# Patient Record
Sex: Female | Born: 1935 | Race: White | Hispanic: No | Smoking: Never smoker
Health system: Southern US, Community
[De-identification: ages and names within clinical notes are randomized; demographics above are authoritative.]

## PROBLEM LIST (undated history)

## (undated) DIAGNOSIS — N200 Calculus of kidney: Secondary | ICD-10-CM

## (undated) DIAGNOSIS — J189 Pneumonia, unspecified organism: Secondary | ICD-10-CM

## (undated) DIAGNOSIS — I10 Essential (primary) hypertension: Secondary | ICD-10-CM

## (undated) DIAGNOSIS — F419 Anxiety disorder, unspecified: Secondary | ICD-10-CM

## (undated) DIAGNOSIS — D649 Anemia, unspecified: Secondary | ICD-10-CM

## (undated) DIAGNOSIS — N390 Urinary tract infection, site not specified: Secondary | ICD-10-CM

## (undated) DIAGNOSIS — M1990 Unspecified osteoarthritis, unspecified site: Secondary | ICD-10-CM

## (undated) DIAGNOSIS — F0390 Unspecified dementia without behavioral disturbance: Secondary | ICD-10-CM

## (undated) DIAGNOSIS — G309 Alzheimer's disease, unspecified: Secondary | ICD-10-CM

## (undated) DIAGNOSIS — F0280 Dementia in other diseases classified elsewhere without behavioral disturbance: Secondary | ICD-10-CM

## (undated) DIAGNOSIS — E785 Hyperlipidemia, unspecified: Secondary | ICD-10-CM

## (undated) HISTORY — DX: Urinary tract infection, site not specified: N39.0

## (undated) HISTORY — DX: Unspecified dementia without behavioral disturbance: F03.90

## (undated) HISTORY — PX: CHOLECYSTECTOMY: SHX55

---

## 2001-12-14 ENCOUNTER — Ambulatory Visit (HOSPITAL_COMMUNITY): Admission: RE | Admit: 2001-12-14 | Discharge: 2001-12-14 | Payer: Self-pay | Admitting: Family Medicine

## 2001-12-14 ENCOUNTER — Encounter: Payer: Self-pay | Admitting: Family Medicine

## 2001-12-23 ENCOUNTER — Ambulatory Visit (HOSPITAL_COMMUNITY): Admission: RE | Admit: 2001-12-23 | Discharge: 2001-12-23 | Payer: Self-pay | Admitting: Family Medicine

## 2001-12-23 ENCOUNTER — Encounter: Payer: Self-pay | Admitting: Family Medicine

## 2005-06-26 ENCOUNTER — Ambulatory Visit (HOSPITAL_COMMUNITY): Admission: RE | Admit: 2005-06-26 | Discharge: 2005-06-26 | Payer: Self-pay | Admitting: Family Medicine

## 2005-08-15 ENCOUNTER — Inpatient Hospital Stay (HOSPITAL_COMMUNITY): Admission: EM | Admit: 2005-08-15 | Discharge: 2005-08-19 | Payer: Self-pay | Admitting: Emergency Medicine

## 2005-08-18 ENCOUNTER — Ambulatory Visit: Payer: Self-pay | Admitting: *Deleted

## 2006-08-10 ENCOUNTER — Ambulatory Visit (HOSPITAL_COMMUNITY): Admission: RE | Admit: 2006-08-10 | Discharge: 2006-08-10 | Payer: Self-pay | Admitting: Family Medicine

## 2007-08-31 ENCOUNTER — Ambulatory Visit (HOSPITAL_COMMUNITY): Admission: RE | Admit: 2007-08-31 | Discharge: 2007-08-31 | Payer: Self-pay | Admitting: Family Medicine

## 2008-09-20 ENCOUNTER — Ambulatory Visit (HOSPITAL_COMMUNITY): Admission: RE | Admit: 2008-09-20 | Discharge: 2008-09-20 | Payer: Self-pay | Admitting: Family Medicine

## 2009-11-28 ENCOUNTER — Ambulatory Visit (HOSPITAL_COMMUNITY): Admission: RE | Admit: 2009-11-28 | Discharge: 2009-11-28 | Payer: Self-pay | Admitting: Family Medicine

## 2011-01-17 NOTE — Group Therapy Note (Signed)
NAMELYNDEN, BORAK                 ACCOUNT NO.:  [id number]   MEDICAL RECORD NO.:  [id number]          PATIENT TYPE:  INP   LOCATION:  A206                          FACILITY:  [facility]   PHYSICIAN:  Angus G. Everette Rank, MD   DATE OF BIRTH:  12/08/1923   DATE OF PROCEDURE:  08/19/2005  DATE OF DISCHARGE:  08/19/2005                                   PROGRESS NOTE   SUBJECTIVE:  This patient was admitted following syncopal episode.  Had new  onset, right bundle branch block noted by ED physician. CT of the had shows  evidence of cortical atrophy, small lacunar infarct felt to be old.  Her  condition remained stable.  She had been seen by Cardiology.  She was to  have an echocardiogram and carotid Doppler ultrasound.   OBJECTIVE:  VITAL SIGNS:  Blood pressure 143/74, respirations 24, pulse 73,  temperature 97.5.  HEART:  Irregular rhythm.  LUNGS:  Clear to P&A.  ABDOMEN:  No palpable organs or masses.   ASSESSMENT:  The patient was admitted following syncopal episode.  She does  have right bundle branch block.   PLAN:  The patient was admitted following episode of syncope, possibly  vasovagal.  The patient currently is scheduled for echocardiogram and  carotid Doppler ultrasound.  Continue current regimen.      Angus G. Everette Rank, MD  Electronically Signed     AGM/MEDQ  D:  08/19/2005  T:  08/20/2005  Job:  [id number]

## 2011-01-17 NOTE — Group Therapy Note (Signed)
[name], [name]                 ACCOUNT NO.:  [id number]   MEDICAL RECORD NO.:  [id number]          PATIENT TYPE:  INP   LOCATION:  A206                          FACILITY:  [facility]   PHYSICIAN:  Angus G. Everette Rank, MD   DATE OF BIRTH:  07/10/1924   DATE OF PROCEDURE:  08/16/2005  DATE OF DISCHARGE:                                   PROGRESS NOTE   SUBJECTIVE:  The patient had a syncopal episode prior to admission.  She did  have new-onset right bundle branch block which was noted by ER physician.  CT scan does show atrophy and small lacunar infarct thought to be old.  The  patient has stated that she does not want CPR, electric shock, etc. should  she have a code.  Her condition has remained stable.   OBJECTIVE:  VITAL SIGNS:  Blood pressure 142/76, respirations 20, pulse 83,  temperature 98.1.  HEART:  Regular rhythm.  LUNGS:  Clear to P&A.  ABDOMEN:  No palpable organs or masses.   ASSESSMENT:  The patient was admitted with syncope, right bundle branch  block.  Cardiac enzymes remained normal with exception of slight elevation  of myoglobin at 291.   PLAN:  Plan is to continue current regimen.  Will obtain cardiology consult.      Angus G. Everette Rank, MD  Electronically Signed     AGM/MEDQ  D:  08/16/2005  T:  08/16/2005  Job:  [id number]

## 2011-01-17 NOTE — Discharge Summary (Signed)
[name], [name]                 ACCOUNT NO.:  [id number]   MEDICAL RECORD NO.:  [id number]          PATIENT TYPE:  INP   LOCATION:  A206                          FACILITY:  [facility]   PHYSICIAN:  Angus G. Everette Rank, MD   DATE OF BIRTH:  1923-11-18   DATE OF ADMISSION:  08/15/2005  DATE OF DISCHARGE:  12/19/2006LH                                 DISCHARGE SUMMARY   A 77 year old black female admitted August 15, 2005, discharged August 19, 2005, four days' hospitalization.   DIAGNOSES:  1.  Syncope.  2.  Mild hypokalemia.  3.  Dementia.   This 77 year old patient was admitted following an episode of syncope. The  patient has had episodes of syncope in the past. The patient does have a  history of dyslipidemia, dementia.   PHYSICAL EXAMINATION:  GENERAL:  Alert female with blood pressure 160/89,  pulse 82, respirations 18.  HEENT:  Eyes:  PERRLA. TMs negative. Oropharynx benign.  NECK:  Supple. No JVD or thyroid abnormalities.  LUNGS:  Clear to P&A.  HEART:  Regular rhythm. A 1/6 systolic murmur left sternal border.  ABDOMEN:  No palpable organs or masses.  EXTREMITIES:  Free of edema.  NEUROLOGICAL:  No focal deficits. The patient has some impaired memory.   LABORATORY DATA:  Admission CBC:  WBC 8,200, hemoglobin 13.3, hematocrit  38.7. Chemistries:  Sodium 137, potassium 3.3, chloride 102, CO2 26, glucose  130, BUN 12, creatinine 1.5, calcium 8.8. Cardiac enzymes:  CPK 52, CPK-MB  1.3, troponin 0.02, CK on admission 58, CK-MB 1.2, troponin 0.01. Subsequent  cardiac markers on August 19, 2005:  CK 49, CK-MB 1, troponin 0.02.  Urinalysis negative. X-rays:  Head CT:  No acute intracranial abnormalities.  Chest x-ray:  Compression fracture at T7, questionable. CT of the head:  Atrophy and small lacunar infarct, probably old.   HOSPITAL COURSE:  The patient was admitted, placed on intravenous fluids,  KVO normal saline. Was continued on home medications, aspirin 325 daily.  Cardiac panel was negative essentially. The patient had no further episode  of syncope during her hospital stay. Two-D echocardiogram was ordered and  carotid Doppler ultrasound was ordered. The patient was seen by cardiology.  Carotid Doppler ultrasound did not show significant stenosis. EKG:  Sinus rhythm, rate of 79, right bundle branch block noted. The patient  showed progressive improvement and was able to be discharged after four days  hospitalization. The patient was discharged on metoprolol 25 mg twice a day  and aspirin 325 mg daily. The patient was in stable condition at the time of  her discharge.      Angus G. Everette Rank, MD  Electronically Signed     AGM/MEDQ  D:  09/05/2005  T:  09/05/2005  Job:  [id number]

## 2011-01-17 NOTE — H&P (Signed)
NAME:  [name], [name] NO.:  [id number]   MEDICAL RECORD NO.:  [id number]          PATIENT TYPE:  INP   LOCATION:  A206                          FACILITY:  [facility]   PHYSICIAN:  Delphina Cahill, M.D.        DATE OF BIRTH:  30-Mar-1924   DATE OF ADMISSION:  08/15/2005  DATE OF DISCHARGE:  LH                                HISTORY & PHYSICAL   HISTORY OF PRESENT ILLNESS:  [name] is a pleasant 77 year old white  female with minimal past medical history who apparently today was out  grocery shopping with her son.  When leaving the store, she passed out in  the parking lot without any knowledge or inciting event.  She was out for  approximately 1-2 minutes and then woke up and had an episode of vomiting  x1, was assisted to chair and then again passed out for a very short period  at that time.  When patient again came to, she did not recall any of the  events.  She did have an event similar to this approximately 1 month ago but  did not have to go to the hospital at that time, had further workup by Dr.  Everette Rank at that time which did not reveal any specific cause.  She was  brought via EMS to the emergency department for further workup of this  syncopal episode.  She denied any chest pain, just some generalized weakness  feeling, denied any shortness of breath, no abdominal problems, no blood in  stools, no problems with bowel or bladder and no problems with lower  extremity edema, no headaches, no blurry vision, no double vision.   PAST MEDICAL HISTORY:  Does have some dyslipidemia, just started on Zetia  approximately 1 month ago, has had a cholecystectomy approximately 20 plus  years ago and has some dementia.   MEDICATIONS:  She is on Zetia 10 mg p.o. daily.  No other specific  medicines.   SOCIAL HISTORY:  Her son lives with her in [place].  She is widowed, does not  smoke or drink, has another son who lives next door to her and daughter who  lives within 10 miles.   Patient does all of her ADL's as far as dressing and  cleaning herself, cooking and cleaning, and previous to this event was also  driving short distances.  Does not require any specific help with her  walking but does hold onto somebody for stability.   REVIEW OF SYSTEMS:  Review of systems as mentioned above are all negative.  Does have some anxiety per daughter's report as well as memory difficulties.   OBJECTIVE:  VITAL SIGNS:  Temperature is 96.7, blood pressure is 160/89,  pulse is 82, respirations are 18, she is 99% on room air.  GENERAL:  This is a mildly obese white female in no acute distress lying in  bed.  HEENT:  Pupils equal, round and react to light and accommodation.  Oropharynx is clear.  Extraocular movements intact.  Neck is supple; no JVD,  no thyromegaly.  LUNGS:  Clear to auscultation bilaterally.  No rhonchi or wheezing.  CARDIOVASCULAR:  Regular rhythm.  Question faint 1/6 systolic murmur best  appreciated at the left upper sternal border.  ABDOMEN:  Soft, nontender, nondistended, with positive bowel sounds.  EXTREMITIES:  Trace lower extremity edema bilaterally with 2+ pulses in all  extremities.  MUSCULOSKELETAL:  5/5 strength in all extremities.  NEUROLOGIC:  Cranial nerves II-XII intact.  No decrease in sensation.  Reflexes were symmetric bilaterally.  Downgoing toes bilaterally.  MEMORY:  Patient is alert to person and place, knows what month but had some  difficulty with this as well as date, does have some definite memory  difficulties, no thorough Mini Mental Status exam was done but what likely  put her in the 15-20 on the Mini Mental Status.  Also, she does have some  anxiety and does have repeated questions but speech is relatively normal.   LABORATORY WORK:  Lab work reveals BMET of sodium 137, potassium 3.3,  chloride 102, CO2 26, glucose 130, BUN 12, creatinine 1.5, calcium 8.8.  CBC  showed a white count of 8.2, hemoglobin of 13.3, platelet count  of 210.  Initial cardiac marker shows CK-MB of less than 1, troponin I of less than  0.05, myoglobin of 292.  UA showed small amount of blood but negative for  everything else.  Second set of cardiac markers show CK-MB of 1, troponin I  of less than 0.05, myoglobin of 291.   CT scan of head reveals atrophy with a small lacunar infarct and posterior  right basal ganglia probably old, no acute intracranial abnormalities at  this time.   EKG attained shows normal sinus rhythm with rate of 79 but significant  rightward axis, right bundle branch block and question left atrial  enlargement, does have ST depression in III and flattening in aVF, also has  ST depression in V1-V3, no significant Q waves and intervals appear normal.   IMPRESSION:  This is a 77 year old white female with a syncopal episode of  unknown origin.   ASSESSMENT AND PLAN:  1.  Syncope.  Patient not having any significant chest pain but given the      EKG she does have a risk factor of dyslipidemia that could be indicative      of some heart abnormality that caused this, an arrhythmia also could      have caused this, we will keep on telemetry and monitor for this.  She      does have a slight murmur and we will get a 2-D echo to assess this      further.  We will cycle enzymes and get another EKG once to floor.  We      will check her thyroid out as well.  Also with her CT scan she does have      atrophy and small lacunar infarct from previous felt to be old but we      will also get a carotid Doppler.  We may want to get an MRI for full      workup with this but we will start her on aspirin 325 mg once daily and      we will get a cardiology consult for management.  2.  Dementia.  She has pretty moderate dementia at this time but is still      very functional.  She may benefit from going on Aricept once felt stable     from a medical standpoint; started on  Aricept to help slow the      progression of her dementia; I  question whether some vascular dementia      plus or minus Alzheimer's but both her mother and sister were diagnosed      with Alzheimer's.  3.  Mild hypokalemia.  Most likely secondary to vomiting episode.  We will      replete x1 now.   DISPOSITION:  Patient will be admitted for further workup.  In discussion  with the patient, she feels that she does not want any extreme measures  taken if something was to happen with her heart.  Essentially she is a  DNR/DNI and does not want to be a burden on anybody else so does not want  CPR or electric shock so DNR form will be filled out on her chart.      Delphina Cahill, M.D.  Electronically Signed     ZH/MEDQ  D:  08/15/2005  T:  08/15/2005  Job:  [id number]

## 2011-01-17 NOTE — Consult Note (Signed)
NAMEJILLIYN, [name] NO.:  [id number]   MEDICAL RECORD NO.:  [id number]          PATIENT TYPE:  INP   LOCATION:  A206                          FACILITY:  [facility]   PHYSICIAN:  Scarlett Presto, M.D.   DATE OF BIRTH:  Sep 03, 1923   DATE OF CONSULTATION:  08/18/2005  DATE OF DISCHARGE:                                   CONSULTATION   REFERRING PHYSICIAN:  Angus G. Everette Rank, M.D.   HISTORY OF PRESENT ILLNESS:  [name] is a 77 year old woman who was  admitted on August 15, 2005, with an episode of syncope.  She states that  she has been somebody who has passed out and fainted since the age of 69.  It is usually associated with standing.  She gets a dizzy feeling and then  usually blacks out.  Most of the time, she can ease herself to the floor  before she loses consciousness and she is frequently able to abort the  episodes by just sitting or reclining.  She says that when she got to be  middle-age, she had far less of these symptoms and really only was  associated with kidney stones where she was acutely ill when she would have  these episodes of loss of consciousness.  Recently she has had two episodes.  The first was in October 2006, when she was sitting on the commode she had a  bowel movement and then lost consciousness.  Her daughter was there and  heard her fall and came into the room and helped her clean herself up.  She  called 911 and by the time they arrived she was completely asymptomatic and  she ended up having an outpatient evaluation by her primary doctor within  nothing found.  She did well until the day of admission when she had been  shopping, first at [facility] for about 45 minutes and then she was in the  line at the grocery store after having stood for about 45 minutes where she  had an episode and felt very dizzy.  She was unable to sit down and lost  consciousness.  She did not injure herself on either occasion, no tongue  biting, no seizure  activity.  She awakened both times without any postictal,  no loss of bowel or bladder continence and she had no injuries.  She denies  any chest pain, palpitations or shortness of breath before and she always  gets this prodrome and it almost always occurs when she is standing.   PAST MEDICAL HISTORY:  1.  Dyslipidemia.  2.  Dementia.  I actually did a Mini-Mental Status Exam on her and she      scored 18/30.  She does not really know where she is.  She thought she      was in the doctor's office, but she is easily redirected and appears to      understand.  She did not know the name of the president.  She did know      it was 2006, but she did not know the day or date.  She knew it was      close to Christmas, however.  She remembers her children's names, how      old they are, when and where they were born, but has difficulty      recalling recent events with very poor short term memory.  3.  History of kidney stones.  4.  Cholecystectomy.  5.  Mild dyslipidemia.   ALLERGIES:  SULFA.   CURRENT MEDICATIONS:  Zetia 10 mg a day as outpatient.   SOCIAL HISTORY:  She lives in [place] with her son who has a seizure  disorder.  She is a widow.  She does not smoke, drink or use illicit drugs  and never has.  She is independent and actually drives, even though she has  had multiple episodes of syncope.   FAMILY HISTORY:  Her mother died of complications of Alzheimer's disease.  Her father died of a myocardial infarction in his 76s.  She has one son who  died of a myocardial infarction in his 35s and another son who died of an  aneurysm at age 74.  She has a daughter who is healthy and two sons who are  reasonably healthy with the exception of the one who has the epilepsy.   REVIEW OF SYSTEMS:  CONSTITUTIONAL:  She denies any fever, chills, sweats,  weight loss, adenopathy, no headaches, no sinus tenderness or sinus  discharge.  No changes in her voice.  No photophobia or vision problems.   No  problems with her skin.  CARDIOPULMONARY:  She denies any chest pain,  shortness of breath, dyspnea on exertion, PND or orthopnea.  No  claudication.  GENITOURINARY:  No urinary frequency or incontinence.  NEUROLOGIC:  No weakness or numbness.  She says she does have some  arthralgias in her left knee and ankle.  GASTROINTESTINAL:  No nausea,  vomiting, diarrhea, bright red blood per rectum, melena, hematochezia,  odynophagia, dysphagia, GERD symptoms.  The remainder of the review of  systems is negative.   PHYSICAL EXAMINATION:  GENERAL:  She is a very pleasant, well-developed,  well-nourished, elderly, white female.  She is alert and oriented x2 as  previously described.  VITAL SIGNS:  Her pulse is 63, respirations 16, blood pressure 159/76 when  she is lying down.  When we sit her up, her heart rate goes up to 87, her  blood pressure goes to 157/82 and when we stand her up her blood pressure  goes to 140/84 with a heart rate of 96.  She does not get symptomatic.  HEENT:  Within normal limits.  Normocephalic, atraumatic.  NECK:  Supple.  She has no JVD.  No carotid bruits.  Carotid upstrokes are  normal.  CARDIAC:  Regular.  There is no murmur.  S1, S2 normal.  It sounds like she  has an S4, but point of maximal impulse is not displaced.  LUNGS:  Clear to auscultation bilaterally.  ABDOMEN:  Normal.  SKIN:  Normal.  GENITALIA:  Deferred.  RECTAL:  Deferred.  BREASTS:  Deferred.  EXTREMITIES:  Trace edema with 1+ pulses.  MUSCULOSKELETAL:  Grossly nonfocal.  NEUROLOGIC:  Grossly nonfocal.   LABORATORY DATA AND X-RAY FINDINGS:  Head CT shows atrophy with small  lacunar infarct on the posterior right basal ganglia.  No acute  abnormalities were seen.  She has a compression fracture at T7 which appears  to be old.  Her EKG shows sinus rhythm with a rate of 73.  She has normal  PR interval.  She has a right bundle branch block with a QRS of 133 msec.  Her  QRS axis is 103, so  mildly rightward.  She has no Q waves concerning for an  old myocardial infarction and her ST and T waves do not appear to be  ischemic.  It is unclear to me how old this right bundle branch block is  since we have no old EKGs for comparison.   White blood cell count 7.0, H&H 11 and 33, platelet count 191.  Sodium 140,  potassium 3.7, chloride 106, bicarb 27, BUN 16, creatinine 1.4 and blood  sugars 90.  TSH is normal.  Urinalysis shows some white blood cells, but  otherwise is normal.   ASSESSMENT:  This is a lady who had an episode of syncope which sounds very  likely to be vasovagal and sounds like it might be a lifelong problem for  her.   RECOMMENDATIONS:  I have recommended to her daughter and the patient that  she stop driving and really should probably surrender her driver's license.  We will get an echocardiogram and a set of carotids and follow her  on telemetry.  I think probably, we should consider using a beta-blocker for  her hypertension and this may also help somewhat with her vagal syncope.  We  will follow her along with the results of workup and make determination as  to whether or not she needs any further therapy.      Scarlett Presto, M.D.  Electronically Signed     JH/MEDQ  D:  08/18/2005  T:  08/18/2005  Job:  [id number]

## 2011-01-17 NOTE — Procedures (Signed)
NAMEJAYDIE, [name] NO.:  [id number]   MEDICAL RECORD NO.:  [id number]          PATIENT TYPE:  INP   LOCATION:  A206                          FACILITY:  [facility]   PHYSICIAN:  Jacqulyn Ducking, M.D. [facility] OF BIRTH:  1924/08/13   DATE OF PROCEDURE:  08/18/2005  DATE OF DISCHARGE:                                  ECHOCARDIOGRAM   REFERRING PHYSICIAN:  Angus G. Everette Rank, M.D./Dr. Nevada Crane   CLINICAL DATA:  A 77 year old woman with syncope.   M-MODE TRACINGS:  Aorta 3.2, left atrium 4.0, septum 1.1, posterior wall  1.2, LV diastole 4.1, LV systole 2.9.   FINDINGS:  1.  Technically adequate echocardiographic study.  2.  Left atrial size at the upper limit of normal; normal right atrial size.  3.  Normal right ventricular size and function; borderline RVH.  4.  Trileaflet aortic valve with minimal sclerosis and very mild      insufficiency; mild annular calcification.  5.  Normal mitral valve; mild annular calcification.  6.  Tricuspid and pulmonic valves not well seen, probably normal.  7.  Normal left ventricular size; borderline hypertrophy; hyperdynamic      regional and global function.  8.  Normal IVC.      Jacqulyn Ducking, M.D. [facility]  Electronically Signed     RR/MEDQ  D:  08/18/2005  T:  08/19/2005  Job:  [id number]

## 2011-01-17 NOTE — Group Therapy Note (Signed)
[name], FORTUNO                 ACCOUNT NO.:  [id number]   MEDICAL RECORD NO.:  [id number]          PATIENT TYPE:  INP   LOCATION:  A206                          FACILITY:  [facility]   PHYSICIAN:  Angus G. Everette Rank, MD   DATE OF BIRTH:  1924/03/01   DATE OF PROCEDURE:  08/18/2005  DATE OF DISCHARGE:                                   PROGRESS NOTE   SUBJECTIVE:  This patient was admitted with syncopal episode, has new onset  right bundle branch block as noted by ER physician, CT scan of the head  shows cortical atrophy, small lacunar infarct thought to be old.   OBJECTIVE:  GENERAL:  Patient's condition remains stable, at times mentally  confused.  LUNGS:  Diminished breath sounds.  HEART:  Regular rhythm.  ABDOMEN:  No palpable organs or masses.   ASSESSMENT:  1.  Syncope.  2.  Right bundle branch block.   PLAN:  Plan to obtain cardiology consult today.      Angus G. Everette Rank, MD  Electronically Signed     AGM/MEDQ  D:  08/18/2005  T:  08/18/2005  Job:  [id number]

## 2011-01-17 NOTE — Group Therapy Note (Signed)
[name], [name]                 ACCOUNT NO.:  [id number]   MEDICAL RECORD NO.:  [id number]          PATIENT TYPE:  INP   LOCATION:  A206                          FACILITY:  [facility]   PHYSICIAN:  Angus G. Everette Rank, MD   DATE OF BIRTH:  08-Dec-1923   DATE OF PROCEDURE:  08/17/2005  DATE OF DISCHARGE:                                   PROGRESS NOTE   SUBJECTIVE:  This patient had a syncopal prior to admission.  She has not  had any further problems.  She does have a new-onset right bundle branch  block noted by ER physician.  CT scan shows cortical atrophy thought to be  old.   OBJECTIVE:  VITAL SIGNS:  Blood pressure 172/76, respirations 19, pulse 96,  temperature 97.8.  HEART:  Regular rate and rhythm.  LUNGS:  Diminished breath sounds.  ABDOMEN:  No palpable organs or masses.   The patient's cardiac markers remained normal with slight elevation in  myoglobin at 291.   ASSESSMENT:  The patient was admitted following episode of syncope, new-  onset and right bundle branch block.   PLAN:  Continue monitoring.  Will be seen and evaluated by cardiology.      Angus G. Everette Rank, MD  Electronically Signed     AGM/MEDQ  D:  08/17/2005  T:  08/18/2005  Job:  [id number]

## 2011-06-13 ENCOUNTER — Other Ambulatory Visit: Payer: Self-pay

## 2011-06-13 ENCOUNTER — Encounter: Payer: Self-pay | Admitting: *Deleted

## 2011-06-13 ENCOUNTER — Emergency Department (HOSPITAL_COMMUNITY)
Admission: EM | Admit: 2011-06-13 | Discharge: 2011-06-13 | Disposition: A | Discharge status: Home / Self Care | Payer: Medicare Other | Attending: Emergency Medicine | Admitting: Emergency Medicine

## 2011-06-13 ENCOUNTER — Emergency Department (HOSPITAL_COMMUNITY): Payer: Medicare Other

## 2011-06-13 DIAGNOSIS — R079 Chest pain, unspecified: Secondary | ICD-10-CM | POA: Insufficient documentation

## 2011-06-13 DIAGNOSIS — N289 Disorder of kidney and ureter, unspecified: Secondary | ICD-10-CM | POA: Insufficient documentation

## 2011-06-13 DIAGNOSIS — I4510 Unspecified right bundle-branch block: Secondary | ICD-10-CM | POA: Insufficient documentation

## 2011-06-13 DIAGNOSIS — F0390 Unspecified dementia without behavioral disturbance: Secondary | ICD-10-CM | POA: Insufficient documentation

## 2011-06-13 HISTORY — DX: Essential (primary) hypertension: I10

## 2011-06-13 HISTORY — DX: Unspecified osteoarthritis, unspecified site: M19.90

## 2011-06-13 LAB — URINE MICROSCOPIC-ADD ON

## 2011-06-13 LAB — TROPONIN I: Troponin I: <0.3 ng/mL (ref <0.30)

## 2011-06-13 LAB — DIFFERENTIAL
Basophils Absolute: 0 10*3/uL (ref 0.0–0.1)
Basophils Relative: 0 % (ref 0–1)
Lymphocytes Relative: 23 % (ref 12–46)
Neutro Abs: 7.3 10*3/uL (ref 1.7–7.7)
Neutrophils Relative %: 70 % (ref 43–77)

## 2011-06-13 LAB — COMPREHENSIVE METABOLIC PANEL
ALT: 9 U/L (ref 0–35)
AST: 12 U/L (ref 0–37)
Albumin: 3.7 g/dL (ref 3.5–5.2)
CO2: 23 mEq/L (ref 19–32)
Calcium: 8.8 mg/dL (ref 8.4–10.5)
Chloride: 108 mEq/L (ref 96–112)
Creatinine, Ser: 2.25 mg/dL — ABNORMAL HIGH (ref 0.50–1.10)
GFR calc non Af Amer: 19 mL/min — ABNORMAL LOW (ref >90)
Sodium: 141 mEq/L (ref 135–145)
Total Bilirubin: 0.2 mg/dL — ABNORMAL LOW (ref 0.3–1.2)

## 2011-06-13 LAB — URINALYSIS, ROUTINE W REFLEX MICROSCOPIC
Bilirubin Urine: NEGATIVE
Glucose, UA: NEGATIVE mg/dL
Specific Gravity, Urine: 1.01 (ref 1.005–1.030)
Urobilinogen, UA: 0.2 mg/dL (ref 0.0–1.0)

## 2011-06-13 LAB — CBC
MCHC: 32.1 g/dL (ref 30.0–36.0)
Platelets: 197 10*3/uL (ref 150–400)
RDW: 13.8 % (ref 11.5–15.5)
WBC: 10.5 10*3/uL (ref 4.0–10.5)

## 2011-06-13 NOTE — ED Provider Notes (Signed)
History     CSN: [id number] Arrival date & time: 06/13/2011 12:38 AM  Chief Complaint  Patient presents with  . Chest Pain    (Consider location/radiation/quality/duration/timing/severity/associated sxs/prior treatment) HPI Comments: Seen 0126. Patient is an elderly woman with significant dementia who lives with a son and daughter-in-law. She is not able to contibute to her history. Marland Kitchen She woke at 10 PM per the son and c/o chest pain. EMS was called and upon their arrival they could not elicit a c/o of pain. Per son patient has been doing well. She requires help with all adls, ambulates with assistance. PCP is Dr. Everette Rank. Last hospitalization was in 2006 for syncope. No recent illnesses, fever, nausea, vomiting, shortness of breath.Level 5 caveat applies.  Patient is a 77 y.o. female presenting with chest pain.  Chest Pain     Past Medical History  Diagnosis Date  . Hypertension   . Arthritis     Past Surgical History  Procedure Date  . Cholecystectomy     No family history on file.  History  Substance Use Topics  . Smoking status: Not on file  . Smokeless tobacco: Not on file  . Alcohol Use:     OB History    Grav Para Term Preterm Abortions TAB SAB Ect Mult Living                  Review of Systems  Unable to perform ROS: Dementia  Cardiovascular: Positive for chest pain.    Allergies  Review of patient's allergies indicates no known allergies.  Home Medications   Current Outpatient Rx  Name Route Sig Dispense Refill  . DONEPEZIL HCL 10 MG PO TABS Oral Take 10 mg by mouth at bedtime as needed.      Marland Kitchen EZETIMIBE-SIMVASTATIN 10-20 MG PO TABS Oral Take 1 tablet by mouth at bedtime.      Marland Kitchen METOPROLOL TARTRATE 25 MG PO TABS Oral Take 25 mg by mouth 2 (two) times daily.        BP 169/60  Pulse 75  Temp(Src) 97.7 F (36.5 C) (Oral)  Resp 20  SpO2 99%  Physical Exam  Nursing note and vitals reviewed. Constitutional: She appears well-developed and  well-nourished. No distress.  HENT:  Head: Normocephalic and atraumatic.  Right Ear: External ear normal.  Left Ear: External ear normal.  Nose: Nose normal.  Mouth/Throat: Oropharynx is clear and moist.  Eyes: EOM are normal. Pupils are equal, round, and reactive to light.  Neck: Normal range of motion. Neck supple. No JVD present.  Cardiovascular: Normal rate, normal heart sounds and intact distal pulses.   Pulmonary/Chest: Effort normal and breath sounds normal. She has no wheezes. She has no rales. She exhibits no tenderness.  Abdominal: Soft. Bowel sounds are normal. She exhibits no distension. There is no tenderness.  Musculoskeletal: Normal range of motion.  Neurological: She is alert. She has normal reflexes.       Oriented to person  Skin: Skin is warm and dry.    ED Course  Procedures (including critical care time) Results for orders placed during the hospital encounter of 06/13/11  TROPONIN I      Component Value Range   Troponin I <0.30  <0.30 (ng/mL)  CBC      Component Value Range   WBC 10.5  4.0 - 10.5 (K/uL)   RBC 3.92  3.87 - 5.11 (MIL/uL)   Hemoglobin 11.7 (*) 12.0 - 15.0 (g/dL)   HCT 36.5  36.0 -  46.0 (%)   MCV 93.1  78.0 - 100.0 (fL)   MCH 29.8  26.0 - 34.0 (pg)   MCHC 32.1  30.0 - 36.0 (g/dL)   RDW 13.8  11.5 - 15.5 (%)   Platelets 197  150 - 400 (K/uL)  DIFFERENTIAL      Component Value Range   Neutrophils Relative 70  43 - 77 (%)   Neutro Abs 7.3  1.7 - 7.7 (K/uL)   Lymphocytes Relative 23  12 - 46 (%)   Lymphs Abs 2.4  0.7 - 4.0 (K/uL)   Monocytes Relative 5  3 - 12 (%)   Monocytes Absolute 0.6  0.1 - 1.0 (K/uL)   Eosinophils Relative 1  0 - 5 (%)   Eosinophils Absolute 0.1  0.0 - 0.7 (K/uL)   Basophils Relative 0  0 - 1 (%)   Basophils Absolute 0.0  0.0 - 0.1 (K/uL)  COMPREHENSIVE METABOLIC PANEL      Component Value Range   Sodium 141  135 - 145 (mEq/L)   Potassium 4.7  3.5 - 5.1 (mEq/L)   Chloride 108  96 - 112 (mEq/L)   CO2 23  19 - 32  (mEq/L)   Glucose, Bld 114 (*) 70 - 99 (mg/dL)   BUN 43 (*) 6 - 23 (mg/dL)   Creatinine, Ser 2.25 (*) 0.50 - 1.10 (mg/dL)   Calcium 8.8  8.4 - 10.5 (mg/dL)   Total Protein 6.8  6.0 - 8.3 (g/dL)   Albumin 3.7  3.5 - 5.2 (g/dL)   AST 12  0 - 37 (U/L)   ALT 9  0 - 35 (U/L)   Alkaline Phosphatase 118 (*) 39 - 117 (U/L)   Total Bilirubin 0.2 (*) 0.3 - 1.2 (mg/dL)   GFR calc non Af Amer 19 (*) >90 (mL/min)   GFR calc Af Amer 22 (*) >90 (mL/min)  URINALYSIS, ROUTINE W REFLEX MICROSCOPIC      Component Value Range   Color, Urine STRAW (*) YELLOW    Appearance CLEAR  CLEAR    Specific Gravity, Urine 1.010  1.005 - 1.030    pH 5.5  5.0 - 8.0    Glucose, UA NEGATIVE  NEGATIVE (mg/dL)   Hgb urine dipstick MODERATE (*) NEGATIVE    Bilirubin Urine NEGATIVE  NEGATIVE    Ketones, ur NEGATIVE  NEGATIVE (mg/dL)   Protein, ur NEGATIVE  NEGATIVE (mg/dL)   Urobilinogen, UA 0.2  0.0 - 1.0 (mg/dL)   Nitrite NEGATIVE  NEGATIVE    Leukocytes, UA NEGATIVE  NEGATIVE   URINE MICROSCOPIC-ADD ON      Component Value Range   Squamous Epithelial / LPF FEW (*) RARE    WBC, UA 3-6  <3 (WBC/hpf)   RBC / HPF 3-6  <3 (RBC/hpf)   Bacteria, UA FEW (*) RARE     Dg Chest Portable 1 View  06/13/2011  *RADIOLOGY REPORT*  Clinical Data: Chest pain.  PORTABLE CHEST - 1 VIEW 06/13/2011 0102 hours:  Comparison: Two-view chest x-ray 11/28/2009 portable chest x-ray 08/15/2005.  Findings: Cardiac silhouette mildly enlarged but stable.  Mild scar and/or bronchiectasis medially at the right lung base, unchanged. Lungs otherwise clear.  No visible pleural effusions.  Pulmonary vascularity normal.  IMPRESSION: Stable mild cardiomegaly.  Stable scar and/or bronchiectasis at the right lung base.  No acute cardiopulmonary disease.  Original Report Authenticated By: Deniece Portela, M.D.    Date: 06/13/2011  0046  Rate: 86  Rhythm: normal sinus rhythm  QRS Axis:  normal  Intervals: normal  ST/T Wave abnormalities: normal   Conduction Disutrbances:right bundle branch block  Narrative Interpretation: Lateral and inferior infarcts ages undetermined  Old EKG Reviewed: none available      MDM  Demented patient with c/o chest pain per son with whom she lives. Labs are remarkable for renal insufficiency which she has documented back in 2006. Troponin is negative. EKG, while abnormal does not indicate an acute process. Chest xray is normal. Patient has not indicated she was experiencing any discomfort.Pt stable in ED with no significant deterioration in condition. Family / Caregiver informed of clinical course, understand medical decision-making process, and agree with plan.The patient appears reasonably screened and/or stabilized for discharge and I doubt any other medical condition or other [facility] requiring further screening, evaluation, or treatment in the ED at this time prior to discharge. MDM Reviewed: previous chart, nursing note and vitals Reviewed previous: labs and x-ray Interpretation: labs, ECG and x-ray          Coralyn Mark S. Olin Hauser, MD 06/13/11 [phone]

## 2011-06-13 NOTE — ED Notes (Signed)
Pt c/o chest pain but when ems arrived, pt denies cp.

## 2013-05-26 ENCOUNTER — Emergency Department (HOSPITAL_COMMUNITY)
Admission: EM | Admit: 2013-05-26 | Discharge: 2013-05-26 | Disposition: A | Discharge status: Home / Self Care | Payer: Medicare Other | Attending: Emergency Medicine | Admitting: Emergency Medicine

## 2013-05-26 ENCOUNTER — Encounter (HOSPITAL_COMMUNITY): Payer: Self-pay

## 2013-05-26 DIAGNOSIS — R55 Syncope and collapse: Secondary | ICD-10-CM | POA: Insufficient documentation

## 2013-05-26 DIAGNOSIS — N390 Urinary tract infection, site not specified: Secondary | ICD-10-CM

## 2013-05-26 DIAGNOSIS — Z9181 History of falling: Secondary | ICD-10-CM | POA: Insufficient documentation

## 2013-05-26 DIAGNOSIS — F0390 Unspecified dementia without behavioral disturbance: Secondary | ICD-10-CM | POA: Insufficient documentation

## 2013-05-26 DIAGNOSIS — M129 Arthropathy, unspecified: Secondary | ICD-10-CM | POA: Insufficient documentation

## 2013-05-26 DIAGNOSIS — Z79899 Other long term (current) drug therapy: Secondary | ICD-10-CM | POA: Insufficient documentation

## 2013-05-26 DIAGNOSIS — I10 Essential (primary) hypertension: Secondary | ICD-10-CM | POA: Insufficient documentation

## 2013-05-26 DIAGNOSIS — D649 Anemia, unspecified: Secondary | ICD-10-CM

## 2013-05-26 DIAGNOSIS — E876 Hypokalemia: Secondary | ICD-10-CM | POA: Insufficient documentation

## 2013-05-26 LAB — CBC
MCV: 91.7 fL (ref 78.0–100.0)
Platelets: 117 10*3/uL — ABNORMAL LOW (ref 150–400)
RBC: 3.39 MIL/uL — ABNORMAL LOW (ref 3.87–5.11)
RDW: 14.1 % (ref 11.5–15.5)
WBC: 7.1 10*3/uL (ref 4.0–10.5)

## 2013-05-26 LAB — URINALYSIS, ROUTINE W REFLEX MICROSCOPIC
Glucose, UA: 250 mg/dL — AB
Ketones, ur: NEGATIVE mg/dL
Leukocytes, UA: NEGATIVE
Nitrite: NEGATIVE
Specific Gravity, Urine: 1.025 (ref 1.005–1.030)
pH: 7 (ref 5.0–8.0)

## 2013-05-26 LAB — URINE MICROSCOPIC-ADD ON

## 2013-05-26 LAB — BASIC METABOLIC PANEL
CO2: 18 mEq/L — ABNORMAL LOW (ref 19–32)
Chloride: 112 mEq/L (ref 96–112)
GFR calc Af Amer: 32 mL/min — ABNORMAL LOW (ref >90)
Potassium: 2.7 mEq/L — CL (ref 3.5–5.1)
Sodium: 142 mEq/L (ref 135–145)

## 2013-05-26 MED ORDER — POTASSIUM CHLORIDE CRYS ER 20 MEQ PO TBCR
40.0000 meq | EXTENDED_RELEASE_TABLET | Freq: Once | ORAL | Status: AC
  Administered 2013-05-26: 40 meq via ORAL
  Filled 2013-05-26: qty 2

## 2013-05-26 MED ORDER — NITROFURANTOIN MONOHYD MACRO 100 MG PO CAPS: 100.0000 mg | ORAL_CAPSULE | Freq: Two times a day (BID) | ORAL | Status: DC

## 2013-05-26 MED ORDER — POTASSIUM CHLORIDE CRYS ER 20 MEQ PO TBCR: 20.0000 meq | EXTENDED_RELEASE_TABLET | Freq: Two times a day (BID) | ORAL | Status: DC

## 2013-05-26 NOTE — ED Notes (Signed)
Per EMS, pt here for eval of fall. States pt fall about two or three times per month

## 2013-05-26 NOTE — ED Notes (Signed)
Pt alert & oriented x4, stable gait. Patient given discharge instructions, paperwork & prescription(s). Patient  instructed to stop at the registration desk to finish any additional paperwork. Patient verbalized understanding. Pt left department w/ no further questions. 

## 2013-05-26 NOTE — ED Notes (Signed)
Went into room to update vital signs.  Patient has patient resting comfortably and requested that I not awake her.  Notified nurse.

## 2013-05-26 NOTE — ED Notes (Signed)
CRITICAL VALUE ALERT  Critical value received:  Potassium 2.7  Date of notification:  05/26/13  Time of notification:  1352  Critical value read back:yes  Nurse who received alert:  GM  MD notified (1st page):  1352  Time of first page:  1352  MD notified (2nd page):  Time of second page:  Responding MD:  [facility]  Time MD responded:  1352

## 2013-05-26 NOTE — ED Notes (Signed)
Pt's family asking about DC, EDP aware.

## 2013-05-30 NOTE — ED Provider Notes (Addendum)
CSN: [id number]     Arrival date & time 05/26/13  1147 History   First MD Initiated Contact with Patient 05/26/13 1230     Chief Complaint  Patient presents with  . Fall   (Consider location/radiation/quality/duration/timing/severity/associated sxs/prior Treatment) HPI ...Marland KitchenMarland KitchenMarland Kitchen status post fall today.   Level V caveat for dementia.  Patient lives at home with family and has had more falls recently. Recently put on Omnicef for alleged bronchitis.   Family reports no major change in behavior.   No bony tenderness. No head or neck trunk.  No fever, chills, dysuria, fever, cough.  Severity is mild to moderate  Past Medical History  Diagnosis Date  . Hypertension   . Arthritis    Past Surgical History  Procedure Laterality Date  . Cholecystectomy     No family history on file. History  Substance Use Topics  . Smoking status: Unknown If Ever Smoked  . Smokeless tobacco: Not on file  . Alcohol Use: Not on file   OB History   Grav Para Term Preterm Abortions TAB SAB Ect Mult Living                 Review of Systems  Unable to perform ROS: Dementia    Allergies  Sulfa antibiotics  Home Medications   Current Outpatient Rx  Name  Route  Sig  Dispense  Refill  . cefdinir (OMNICEF) 300 MG capsule   Oral   Take 300 mg by mouth 2 (two) times daily. For 7 days. Started on 9/24         . donepezil (ARICEPT) 10 MG tablet   Oral   Take 10 mg by mouth at bedtime as needed.           . ezetimibe-simvastatin (VYTORIN) 10-20 MG per tablet   Oral   Take 1 tablet by mouth at bedtime.           Marland Kitchen HYDROcodone-homatropine (HYCODAN) 5-1.5 MG/5ML syrup   Oral   Take 5 mLs by mouth every 4 (four) hours as needed for cough.         . metoprolol tartrate (LOPRESSOR) 25 MG tablet   Oral   Take 25 mg by mouth 2 (two) times daily.           . nitrofurantoin, macrocrystal-monohydrate, (MACROBID) 100 MG capsule   Oral   Take 1 capsule (100 mg total) by mouth 2 (two) times daily. X 7  days   10 capsule   0   . potassium chloride SA (K-DUR,KLOR-CON) 20 MEQ tablet   Oral   Take 1 tablet (20 mEq total) by mouth 2 (two) times daily.   30 tablet   0    BP 167/91  Pulse 60  Temp(Src) 98.6 F (37 C) (Oral)  Resp 18  SpO2 100% Physical Exam  Nursing note and vitals reviewed. Constitutional:  No acute distress  HENT:  Head: Normocephalic and atraumatic.  Eyes: Conjunctivae and EOM are normal. Pupils are equal, round, and reactive to light.  Neck: Normal range of motion. Neck supple.  Cardiovascular: Normal rate, regular rhythm and normal heart sounds.   Pulmonary/Chest: Effort normal and breath sounds normal.  Abdominal: Soft. Bowel sounds are normal.  Musculoskeletal: Normal range of motion.  Neurological: She is alert.  Skin: Skin is warm and dry.  Psychiatric:  demented    ED Course  Procedures (including critical care time) Labs Review Labs Reviewed  CBC - Abnormal; Notable for the following:  RBC 3.39 (*)    Hemoglobin 9.9 (*)    HCT 31.1 (*)    Platelets 117 (*)    All other components within normal limits  BASIC METABOLIC PANEL - Abnormal; Notable for the following:    Potassium 2.7 (*)    CO2 18 (*)    Glucose, Bld 111 (*)    BUN 25 (*)    Creatinine, Ser 1.63 (*)    Calcium 7.0 (*)    GFR calc non Af Amer 27 (*)    GFR calc Af Amer 32 (*)    All other components within normal limits  URINALYSIS, ROUTINE W REFLEX MICROSCOPIC - Abnormal; Notable for the following:    Glucose, UA 250 (*)    Hgb urine dipstick MODERATE (*)    Protein, ur 30 (*)    All other components within normal limits  URINE MICROSCOPIC-ADD ON - Abnormal; Notable for the following:    Squamous Epithelial / LPF FEW (*)    All other components within normal limits   Imaging Review No results found.  Date: 05/26/2013  Rate: 61  Rhythm: normal sinus rhythm  QRS Axis: normal  Intervals: normal  ST/T Wave abnormalities: normal  Conduction Disutrbances: RBBB   Narrative Interpretation: unremarkable     MDM   1. Syncope   2. Anemia   3. Hypokalemia   4. Urinary tract infection    Results of all test discussed with family.  Specifically I addressed her anemia, hypokalemia, urinary tract infection. EKG shows normal sinus rhythm. No ectopy.  Discharge medications potassium and Macrobid.  Patient has primary care followup. Son and daughter are comfortable with this plan.    Nat Christen, MD 06/02/13 [place], MD 06/03/13 [place], MD 06/03/13 [phone]

## 2013-11-17 ENCOUNTER — Emergency Department (HOSPITAL_COMMUNITY): Payer: Medicare Other

## 2013-11-17 ENCOUNTER — Inpatient Hospital Stay (HOSPITAL_COMMUNITY)
Admission: EM | Admit: 2013-11-17 | Discharge: 2013-11-22 | DRG: 470 | Disposition: A | Discharge status: Skilled Nursing Facility | Payer: Medicare Other | Attending: Family Medicine | Admitting: Family Medicine

## 2013-11-17 ENCOUNTER — Encounter (HOSPITAL_COMMUNITY): Payer: Self-pay | Admitting: Emergency Medicine

## 2013-11-17 DIAGNOSIS — F0390 Unspecified dementia without behavioral disturbance: Secondary | ICD-10-CM

## 2013-11-17 DIAGNOSIS — D500 Iron deficiency anemia secondary to blood loss (chronic): Secondary | ICD-10-CM | POA: Diagnosis not present

## 2013-11-17 DIAGNOSIS — Z820 Family history of epilepsy and other diseases of the nervous system: Secondary | ICD-10-CM

## 2013-11-17 DIAGNOSIS — Z7982 Long term (current) use of aspirin: Secondary | ICD-10-CM

## 2013-11-17 DIAGNOSIS — S0083XA Contusion of other part of head, initial encounter: Secondary | ICD-10-CM

## 2013-11-17 DIAGNOSIS — A498 Other bacterial infections of unspecified site: Secondary | ICD-10-CM | POA: Diagnosis present

## 2013-11-17 DIAGNOSIS — S0003XA Contusion of scalp, initial encounter: Secondary | ICD-10-CM | POA: Diagnosis present

## 2013-11-17 DIAGNOSIS — G309 Alzheimer's disease, unspecified: Secondary | ICD-10-CM | POA: Diagnosis present

## 2013-11-17 DIAGNOSIS — S72009A Fracture of unspecified part of neck of unspecified femur, initial encounter for closed fracture: Secondary | ICD-10-CM | POA: Diagnosis present

## 2013-11-17 DIAGNOSIS — F0280 Dementia in other diseases classified elsewhere without behavioral disturbance: Secondary | ICD-10-CM | POA: Diagnosis present

## 2013-11-17 DIAGNOSIS — I10 Essential (primary) hypertension: Secondary | ICD-10-CM

## 2013-11-17 DIAGNOSIS — I129 Hypertensive chronic kidney disease with stage 1 through stage 4 chronic kidney disease, or unspecified chronic kidney disease: Secondary | ICD-10-CM | POA: Diagnosis present

## 2013-11-17 DIAGNOSIS — M129 Arthropathy, unspecified: Secondary | ICD-10-CM | POA: Diagnosis present

## 2013-11-17 DIAGNOSIS — Y92009 Unspecified place in unspecified non-institutional (private) residence as the place of occurrence of the external cause: Secondary | ICD-10-CM

## 2013-11-17 DIAGNOSIS — S7290XA Unspecified fracture of unspecified femur, initial encounter for closed fracture: Secondary | ICD-10-CM | POA: Diagnosis present

## 2013-11-17 DIAGNOSIS — W010XXA Fall on same level from slipping, tripping and stumbling without subsequent striking against object, initial encounter: Secondary | ICD-10-CM | POA: Diagnosis present

## 2013-11-17 DIAGNOSIS — Z882 Allergy status to sulfonamides status: Secondary | ICD-10-CM

## 2013-11-17 DIAGNOSIS — S72033A Displaced midcervical fracture of unspecified femur, initial encounter for closed fracture: Principal | ICD-10-CM | POA: Diagnosis present

## 2013-11-17 DIAGNOSIS — Z8249 Family history of ischemic heart disease and other diseases of the circulatory system: Secondary | ICD-10-CM

## 2013-11-17 DIAGNOSIS — N390 Urinary tract infection, site not specified: Secondary | ICD-10-CM | POA: Diagnosis present

## 2013-11-17 DIAGNOSIS — Z66 Do not resuscitate: Secondary | ICD-10-CM | POA: Diagnosis present

## 2013-11-17 DIAGNOSIS — N184 Chronic kidney disease, stage 4 (severe): Secondary | ICD-10-CM

## 2013-11-17 DIAGNOSIS — S1093XA Contusion of unspecified part of neck, initial encounter: Secondary | ICD-10-CM

## 2013-11-17 HISTORY — DX: Hyperlipidemia, unspecified: E78.5

## 2013-11-17 HISTORY — DX: Calculus of kidney: N20.0

## 2013-11-17 HISTORY — DX: Alzheimer's disease, unspecified: G30.9

## 2013-11-17 HISTORY — DX: Dementia in other diseases classified elsewhere, unspecified severity, without behavioral disturbance, psychotic disturbance, mood disturbance, and anxiety: F02.80

## 2013-11-17 HISTORY — DX: Unspecified dementia, unspecified severity, without behavioral disturbance, psychotic disturbance, mood disturbance, and anxiety: F03.90

## 2013-11-17 LAB — CBC WITH DIFFERENTIAL/PLATELET
BASOS PCT: 1 % (ref 0–1)
Basophils Absolute: 0 10*3/uL (ref 0.0–0.1)
EOS ABS: 0.2 10*3/uL (ref 0.0–0.7)
EOS PCT: 2 % (ref 0–5)
HEMATOCRIT: 37 % (ref 36.0–46.0)
HEMOGLOBIN: 11.8 g/dL — AB (ref 12.0–15.0)
LYMPHS ABS: 2.3 10*3/uL (ref 0.7–4.0)
Lymphocytes Relative: 35 % (ref 12–46)
MCH: 29.1 pg (ref 26.0–34.0)
MCHC: 31.9 g/dL (ref 30.0–36.0)
MCV: 91.1 fL (ref 78.0–100.0)
MONO ABS: 0.3 10*3/uL (ref 0.1–1.0)
MONOS PCT: 5 % (ref 3–12)
Neutro Abs: 3.6 10*3/uL (ref 1.7–7.7)
Neutrophils Relative %: 57 % (ref 43–77)
Platelets: 160 10*3/uL (ref 150–400)
RBC: 4.06 MIL/uL (ref 3.87–5.11)
RDW: 14.1 % (ref 11.5–15.5)
WBC: 6.4 10*3/uL (ref 4.0–10.5)

## 2013-11-17 LAB — COMPREHENSIVE METABOLIC PANEL
ALBUMIN: 3.9 g/dL (ref 3.5–5.2)
ALT: 12 U/L (ref 0–35)
AST: 17 U/L (ref 0–37)
Alkaline Phosphatase: 96 U/L (ref 39–117)
BUN: 47 mg/dL — AB (ref 6–23)
CALCIUM: 9.2 mg/dL (ref 8.4–10.5)
CO2: 23 mEq/L (ref 19–32)
CREATININE: 2.36 mg/dL — AB (ref 0.50–1.10)
Chloride: 108 mEq/L (ref 96–112)
GFR calc Af Amer: 20 mL/min — ABNORMAL LOW (ref >90)
GFR calc non Af Amer: 17 mL/min — ABNORMAL LOW (ref >90)
Glucose, Bld: 105 mg/dL — ABNORMAL HIGH (ref 70–99)
Potassium: 5.7 mEq/L — ABNORMAL HIGH (ref 3.7–5.3)
Sodium: 144 mEq/L (ref 137–147)
TOTAL PROTEIN: 7.7 g/dL (ref 6.0–8.3)
Total Bilirubin: 0.2 mg/dL — ABNORMAL LOW (ref 0.3–1.2)

## 2013-11-17 LAB — PROTIME-INR
INR: 0.95 (ref 0.00–1.49)
PROTHROMBIN TIME: 12.5 s (ref 11.6–15.2)

## 2013-11-17 LAB — URINALYSIS, ROUTINE W REFLEX MICROSCOPIC
BILIRUBIN URINE: NEGATIVE
Glucose, UA: NEGATIVE mg/dL
KETONES UR: NEGATIVE mg/dL
Leukocytes, UA: NEGATIVE
NITRITE: NEGATIVE
Protein, ur: NEGATIVE mg/dL
Specific Gravity, Urine: <1.005 — ABNORMAL LOW (ref 1.005–1.030)
Urobilinogen, UA: 0.2 mg/dL (ref 0.0–1.0)
pH: 6.5 (ref 5.0–8.0)

## 2013-11-17 LAB — URINE MICROSCOPIC-ADD ON

## 2013-11-17 MED ORDER — ONDANSETRON HCL 4 MG/2ML IJ SOLN
4.0000 mg | Freq: Once | INTRAMUSCULAR | Status: AC
  Administered 2013-11-17: 4 mg via INTRAVENOUS
  Filled 2013-11-17: qty 2

## 2013-11-17 MED ORDER — SIMVASTATIN 20 MG PO TABS
40.0000 mg | ORAL_TABLET | Freq: Every day | ORAL | Status: DC
  Administered 2013-11-19 – 2013-11-21 (×3): 40 mg via ORAL
  Filled 2013-11-17 (×3): qty 2

## 2013-11-17 MED ORDER — DONEPEZIL HCL 5 MG PO TABS
10.0000 mg | ORAL_TABLET | Freq: Every day | ORAL | Status: DC
Start: 2013-11-18 — End: 2013-11-22
  Administered 2013-11-19 – 2013-11-21 (×3): 10 mg via ORAL
  Filled 2013-11-17 (×3): qty 2

## 2013-11-17 MED ORDER — HYDROCODONE-ACETAMINOPHEN 5-325 MG PO TABS
1.0000 | ORAL_TABLET | Freq: Four times a day (QID) | ORAL | Status: DC | PRN
  Administered 2013-11-18: 2 via ORAL
  Administered 2013-11-20 – 2013-11-21 (×2): 1 via ORAL
  Filled 2013-11-17 (×2): qty 1
  Filled 2013-11-17: qty 2

## 2013-11-17 MED ORDER — SODIUM CHLORIDE 0.9 % IV SOLN
INTRAVENOUS | Status: DC
Start: 2013-11-18 — End: 2013-11-22
  Administered 2013-11-18: 75 mL/h via INTRAVENOUS
  Administered 2013-11-18 – 2013-11-20 (×4): via INTRAVENOUS

## 2013-11-17 MED ORDER — MORPHINE SULFATE 2 MG/ML IJ SOLN
0.5000 mg | INTRAMUSCULAR | Status: DC | PRN
  Administered 2013-11-18 – 2013-11-20 (×10): 0.5 mg via INTRAVENOUS
  Filled 2013-11-17 (×10): qty 1

## 2013-11-17 MED ORDER — EZETIMIBE 10 MG PO TABS
10.0000 mg | ORAL_TABLET | Freq: Every day | ORAL | Status: DC
  Administered 2013-11-19 – 2013-11-21 (×3): 10 mg via ORAL
  Filled 2013-11-17 (×3): qty 1

## 2013-11-17 MED ORDER — HYDRALAZINE HCL 25 MG PO TABS
25.0000 mg | ORAL_TABLET | Freq: Four times a day (QID) | ORAL | Status: DC | PRN
  Administered 2013-11-21: 25 mg via ORAL
  Filled 2013-11-17: qty 1

## 2013-11-17 MED ORDER — METOPROLOL TARTRATE 25 MG PO TABS
25.0000 mg | ORAL_TABLET | Freq: Two times a day (BID) | ORAL | Status: DC
  Administered 2013-11-18 – 2013-11-22 (×7): 25 mg via ORAL
  Filled 2013-11-17 (×7): qty 1

## 2013-11-17 MED ORDER — MORPHINE SULFATE 4 MG/ML IJ SOLN
4.0000 mg | INTRAMUSCULAR | Status: DC | PRN
  Administered 2013-11-17 (×2): 4 mg via INTRAVENOUS
  Filled 2013-11-17 (×2): qty 1

## 2013-11-17 MED ORDER — POVIDONE-IODINE 10 % EX SOLN: Freq: Once | CUTANEOUS | Status: DC

## 2013-11-17 MED ORDER — EZETIMIBE-SIMVASTATIN 10-20 MG PO TABS: 1.0000 | ORAL_TABLET | Freq: Every day | ORAL | Status: DC

## 2013-11-17 NOTE — H&P (Addendum)
PCP:   Lanette Hampshire, MD   Chief Complaint:  Fall  HPI:  80 year old female who  has a past medical history of Hypertension; Arthritis; and Alzheimer disease. today presented to the ED after patient had a mechanical fall at home. Patient has severe dementia and is a poor historian, history provided by the patient's son at bedside. As per patient's son, she tripped on the rug at the front total hitting her head and then later her left hip. There was no loss of consciousness, no blurred vision. She denies nausea vomiting or diarrhea no chest pain shortness of breath. In the ED, x-ray of the hip shows displaced subcapital fracture of the left femoral neck. Patient is very anxious, complains of left hip pain.  Allergies:   Allergies  Allergen Reactions  . Sulfa Antibiotics Rash      Past Medical History  Diagnosis Date  . Hypertension   . Arthritis   . Alzheimer disease     Past Surgical History  Procedure Laterality Date  . Cholecystectomy      Prior to Admission medications   Medication Sig Start Date End Date Taking? Authorizing Provider  aspirin 325 MG tablet Take 325 mg by mouth daily.   Yes Historical Provider, MD  donepezil (ARICEPT) 10 MG tablet Take 10 mg by mouth at bedtime as needed.     Yes Historical Provider, MD  ezetimibe-simvastatin (VYTORIN) 10-20 MG per tablet Take 1 tablet by mouth at bedtime.     Yes Historical Provider, MD  metoprolol tartrate (LOPRESSOR) 25 MG tablet Take 25 mg by mouth 2 (two) times daily.     Yes Historical Provider, MD    Social History:  has no tobacco, alcohol, and drug history on file.  History reviewed. No pertinent family history.   All the positives are listed in BOLD  Review of Systems:  As in the history of present illness , rest of review of systems is unobtainable  due to patient's dementia,   Physical Exam: There were no vitals taken for this visit. Constitutional:   Patient is a demented female in severe  pain. Head: Normocephalic and atraumatic Mouth: Mucus membranes moist Eyes: PERRL, EOMI, conjunctivae normal Neck: Supple, No Thyromegaly Cardiovascular: RRR, S1 normal, S2 normal Pulmonary/Chest: CTAB, no wheezes, rales, or rhonchi Abdominal: Soft. Non-tender, non-distended, bowel sounds are normal, no masses, organomegaly, or guarding present.  Neurological: Alert, not oriented, moving all extremities.  Extremities : Left hip externally rotated, pain on movement of left   Labs on Admission:  Results for orders placed during the hospital encounter of 11/17/13 (from the past 48 hour(s))  CBC WITH DIFFERENTIAL     Status: Abnormal   Collection Time    11/17/13  7:21 PM      Result Value Ref Range   WBC 6.4  4.0 - 10.5 K/uL   RBC 4.06  3.87 - 5.11 MIL/uL   Hemoglobin 11.8 (*) 12.0 - 15.0 g/dL   HCT 37.0  36.0 - 46.0 %   MCV 91.1  78.0 - 100.0 fL   MCH 29.1  26.0 - 34.0 pg   MCHC 31.9  30.0 - 36.0 g/dL   RDW 14.1  11.5 - 15.5 %   Platelets 160  150 - 400 K/uL   Neutrophils Relative % 57  43 - 77 %   Neutro Abs 3.6  1.7 - 7.7 K/uL   Lymphocytes Relative 35  12 - 46 %   Lymphs Abs 2.3  0.7 - 4.0  K/uL   Monocytes Relative 5  3 - 12 %   Monocytes Absolute 0.3  0.1 - 1.0 K/uL   Eosinophils Relative 2  0 - 5 %   Eosinophils Absolute 0.2  0.0 - 0.7 K/uL   Basophils Relative 1  0 - 1 %   Basophils Absolute 0.0  0.0 - 0.1 K/uL  COMPREHENSIVE METABOLIC PANEL     Status: Abnormal   Collection Time    11/17/13  7:21 PM      Result Value Ref Range   Sodium 144  137 - 147 mEq/L   Potassium 5.7 (*) 3.7 - 5.3 mEq/L   Chloride 108  96 - 112 mEq/L   CO2 23  19 - 32 mEq/L   Glucose, Bld 105 (*) 70 - 99 mg/dL   BUN 47 (*) 6 - 23 mg/dL   Creatinine, Ser 2.36 (*) 0.50 - 1.10 mg/dL   Calcium 9.2  8.4 - 10.5 mg/dL   Total Protein 7.7  6.0 - 8.3 g/dL   Albumin 3.9  3.5 - 5.2 g/dL   AST 17  0 - 37 U/L   ALT 12  0 - 35 U/L   Alkaline Phosphatase 96  39 - 117 U/L   Total Bilirubin 0.2 (*) 0.3  - 1.2 mg/dL   GFR calc non Af Amer 17 (*) >90 mL/min   GFR calc Af Amer 20 (*) >90 mL/min   Comment: (NOTE)     The eGFR has been calculated using the CKD EPI equation.     This calculation has not been validated in all clinical situations.     eGFR's persistently <90 mL/min signify possible Chronic Kidney     Disease.  PROTIME-INR     Status: None   Collection Time    11/17/13  7:21 PM      Result Value Ref Range   Prothrombin Time 12.5  11.6 - 15.2 seconds   INR 0.95  0.00 - 1.49  TYPE AND SCREEN     Status: None   Collection Time    11/17/13  7:21 PM      Result Value Ref Range   ABO/RH(D) A POS     Antibody Screen NEG     Sample Expiration 11/20/2013    URINALYSIS, ROUTINE W REFLEX MICROSCOPIC     Status: Abnormal   Collection Time    11/17/13  8:42 PM      Result Value Ref Range   Color, Urine YELLOW  YELLOW   APPearance HAZY (*) CLEAR   Specific Gravity, Urine <1.005 (*) 1.005 - 1.030   pH 6.5  5.0 - 8.0   Glucose, UA NEGATIVE  NEGATIVE mg/dL   Hgb urine dipstick SMALL (*) NEGATIVE   Bilirubin Urine NEGATIVE  NEGATIVE   Ketones, ur NEGATIVE  NEGATIVE mg/dL   Protein, ur NEGATIVE  NEGATIVE mg/dL   Urobilinogen, UA 0.2  0.0 - 1.0 mg/dL   Nitrite NEGATIVE  NEGATIVE   Leukocytes, UA NEGATIVE  NEGATIVE  URINE MICROSCOPIC-ADD ON     Status: Abnormal   Collection Time    11/17/13  8:42 PM      Result Value Ref Range   Squamous Epithelial / LPF RARE  RARE   WBC, UA 3-6  <3 WBC/hpf   RBC / HPF 0-2  <3 RBC/hpf   Bacteria, UA FEW (*) RARE    Radiological Exams on Admission: Dg Chest 1 View  11/17/2013   CLINICAL DATA:  Severe left hip pain,  fell today  EXAM: CHEST - 1 VIEW  COMPARISON:  06/13/2011  FINDINGS: Enlargement of cardiac silhouette.  Mediastinal contours and pulmonary vascularity normal.  Bronchitic changes without infiltrate, pleural effusion or pneumothorax.  No acute osseous findings.  Bones demineralized.  IMPRESSION: Enlargement of cardiac silhouette.   Minimal bronchitic changes without infiltrate.   Electronically Signed   By: Ulyses Southward M.D.   On: 11/17/2013 20:26   Dg Hip Complete Left  11/17/2013   CLINICAL DATA:  Left hip pain post fall today  EXAM: LEFT HIP - COMPLETE 2+ VIEW  COMPARISON:  None  FINDINGS: Osseous demineralization.  Displaced left femoral neck fracture, subcapital position.  No dislocation.  Mild narrowing of bilateral hip joints.  SI joints symmetric.  Degenerative disc disease changes lower lumbar spine.  No acute fracture, dislocation, or bone destruction.  IMPRESSION: Displaced subcapital fracture of left femoral neck.  Osseous demineralization.   Electronically Signed   By: Ulyses Southward M.D.   On: 11/17/2013 20:25   Ct Head Wo Contrast  11/17/2013   CLINICAL DATA:  Fall, left frontal hematoma, history hypertension, Alzheimer's  EXAM: CT HEAD WITHOUT CONTRAST  CT CERVICAL SPINE WITHOUT CONTRAST  TECHNIQUE: Multidetector CT imaging of the head and cervical spine was performed following the standard protocol without intravenous contrast. Multiplanar CT image reconstructions of the cervical spine were also generated.  COMPARISON:  CT head 08/15/2005  FINDINGS: CT HEAD FINDINGS  Generalized atrophy.  Normal ventricular morphology.  No midline shift or mass effect.  Question beam hardening artifacts at brainstem/pons.  No intracranial hemorrhage, mass lesion or evidence acute infarction.  No extra-axial fluid collections.  Scattered dural calcifications.  Hyperostosis frontalis interna.  Left frontoparietal scalp hematoma.  No acute osseous findings.  Paranasal sinuses clear.  CT CERVICAL SPINE FINDINGS  Visualized skullbase intact.  Osseous demineralization.  Lung apices clear.  Prevertebral soft tissues normal thickness.  Scattered facet degenerative changes.  Minimal disc space narrowing C5-C6 and C6-C7.  No acute fracture, subluxation or bone destruction.  IMPRESSION: Generalized atrophy.  Left frontoparietal scalp hematoma.  No  acute intracranial abnormalities.  Mild degenerative disc and facet disease changes of the cervical spine.  No acute cervical spine abnormalities.   Electronically Signed   By: Ulyses Southward M.D.   On: 11/17/2013 20:53   Ct Cervical Spine Wo Contrast  11/17/2013   CLINICAL DATA:  Fall, left frontal hematoma, history hypertension, Alzheimer's  EXAM: CT HEAD WITHOUT CONTRAST  CT CERVICAL SPINE WITHOUT CONTRAST  TECHNIQUE: Multidetector CT imaging of the head and cervical spine was performed following the standard protocol without intravenous contrast. Multiplanar CT image reconstructions of the cervical spine were also generated.  COMPARISON:  CT head 08/15/2005  FINDINGS: CT HEAD FINDINGS  Generalized atrophy.  Normal ventricular morphology.  No midline shift or mass effect.  Question beam hardening artifacts at brainstem/pons.  No intracranial hemorrhage, mass lesion or evidence acute infarction.  No extra-axial fluid collections.  Scattered dural calcifications.  Hyperostosis frontalis interna.  Left frontoparietal scalp hematoma.  No acute osseous findings.  Paranasal sinuses clear.  CT CERVICAL SPINE FINDINGS  Visualized skullbase intact.  Osseous demineralization.  Lung apices clear.  Prevertebral soft tissues normal thickness.  Scattered facet degenerative changes.  Minimal disc space narrowing C5-C6 and C6-C7.  No acute fracture, subluxation or bone destruction.  IMPRESSION: Generalized atrophy.  Left frontoparietal scalp hematoma.  No acute intracranial abnormalities.  Mild degenerative disc and facet disease changes of the cervical spine.  No  acute cervical spine abnormalities.   Electronically Signed   By: Lavonia Dana M.D.   On: 11/17/2013 20:53    Assessment/Plan Principal Problem:   Femur fracture Active Problems:   Hip fracture   Dementia   HTN (hypertension)   CKD (chronic kidney disease), stage IV  Femur fracture Patient will be admitted, orthopedics has been consulted Family would like  to discuss the pros and the cons of surgery with orthopedics in a.m. Will initiate hip fracture protocol Morphine when necessary for pain control  Hypertension We'll start hydralazine when necessary for her pressure greater than 160/100.  Chronic kidney disease Stable, has been started on IV fluids. Continue to monitor BMP in the hospital  Frontoparietal scalp hematoma Patient has small scalp hematoma as seen on the CT of the head, no intracranial bleed. Stable, will avoid Lovenox for DVT prophylaxis due to hematoma.  DVT prophylaxis SCDs  Risk stratification Patient does not have history of CAD, she has been able to walk in the house before the fall. There was no history of any chest pain or shortness of breath on exertion. Will obtain EKG. Patient is high risk for surgery due to extreme age and dementia.  Code status: DO NOT RESUSCITATE  Family discussion: Discussed the patient's son and daughter at bedside   Time Spent on Admission: 28 minutes  [facility] Pager: [phone] 11/17/2013, 9:30 PM  If 7PM-7AM, please contact night-coverage  [email or web address]  Password [email or web address]

## 2013-11-17 NOTE — ED Provider Notes (Signed)
CSN: [id number]     Arrival date & time 11/17/13  1855 History   First MD Initiated Contact with Patient 11/17/13 1908 This chart was scribed for Tanna Furry, MD by Anastasia Pall, ED Scribe. This patient was seen in room APA01/APA01 and the patient's care was started at 7:13 PM.     Chief Complaint  Patient presents with  . Hip Pain  . Fall   (Consider location/radiation/quality/duration/timing/severity/associated sxs/prior Treatment) Level 5 Caveat - Dementia The history is provided by a relative. The history is limited by the condition of the patient. No language interpreter was used.  HPI Comments: [name] is a 80 y.o. female brought in by EMS along with relatives, who presents to the Emergency Department with constant left hip pain, onset earlier this evening after falling from a standing position hitting her head on a door and then landing on her left side. There was no obvious deformity.   Relative states she takes Aspirin 325 regularly and states she is allergic to Sulfa antibiotics.   PCP - Lanette Hampshire, MD  Past Medical History  Diagnosis Date  . Hypertension   . Arthritis   . Alzheimer disease   . Hyperlipemia   . Kidney calculus    Past Surgical History  Procedure Laterality Date  . Cholecystectomy     Family History  Problem Relation Age of Onset  . Alzheimer's disease Mother   . Heart attack Father    History  Substance Use Topics  . Smoking status: Never Smoker   . Smokeless tobacco: Not on file  . Alcohol Use: No   OB History   Grav Para Term Preterm Abortions TAB SAB Ect Mult Living                 Review of Systems  Unable to perform ROS: Dementia  Level 5 Caveat  Allergies  Sulfa antibiotics  Home Medications   No current outpatient prescriptions on file. BP 155/89  Pulse 93  Temp(Src) 97.7 F (36.5 C) (Axillary)  Resp 17  Ht 5\' 2"  (1.575 m)  Wt 185 lb (83.915 kg)  BMI 33.83 kg/m2  SpO2 100%  Physical Exam  Constitutional:  She is oriented to person, place, and time. She appears well-developed and well-nourished. No distress.  HENT:  Head: Normocephalic.  Golf ball sized hematoma on left frontal forehead.   Eyes: Conjunctivae are normal. Pupils are equal, round, and reactive to light. No scleral icterus.  2 symmetrically active.   Neck: Normal range of motion. Neck supple. No thyromegaly present.  Cardiovascular: Normal rate and regular rhythm.  Exam reveals no gallop and no friction rub.   No murmur heard. Pulmonary/Chest: Effort normal and breath sounds normal. No respiratory distress. She has no wheezes. She has no rales. She exhibits no tenderness.  Abdominal: Soft. Bowel sounds are normal. She exhibits no distension. There is no tenderness. There is no rebound.  Musculoskeletal: Normal range of motion.  Left leg about an inch shortened, rotated externally. Tender over greater trochanter.   Neurological: She is alert and oriented to person, place, and time.  Confused, demented. Non focal.   Skin: Skin is warm and dry. No rash noted.  Psychiatric: She has a normal mood and affect. Her behavior is normal.   ED Course  Procedures (including critical care time)  COORDINATION OF CARE: 7:16 PM-Discussed treatment plan which includes DG left hip and EKG with pt at bedside and pt agreed to plan.   Results for orders  placed during the hospital encounter of 11/17/13  URINE CULTURE      Result Value Ref Range   Specimen Description URINE, CATHETERIZED     Special Requests NONE     Culture  Setup Time       Value: 11/17/2013 21:20     Performed at [facility]       Value: >=100,000 COLONIES/ML     Performed at [facility]   Culture       Value: ESCHERICHIA COLI     Performed at [facility]   Report Status 11/20/2013 FINAL     Organism ID, Bacteria ESCHERICHIA COLI    SURGICAL PCR SCREEN      Result Value Ref Range   MRSA, PCR NEGATIVE  NEGATIVE    Staphylococcus aureus NEGATIVE  NEGATIVE  WOUND CULTURE      Result Value Ref Range   Specimen Description HIP LEFT CAPSULE     Special Requests ANCEF 2gm     Gram Stain       Value: NO WBC SEEN     NO SQUAMOUS EPITHELIAL CELLS SEEN     NO ORGANISMS SEEN     Performed at [facility]   Culture       Value: NO GROWTH 2 DAYS     Performed at [facility]   Report Status 11/21/2013 FINAL    CBC WITH DIFFERENTIAL      Result Value Ref Range   WBC 6.4  4.0 - 10.5 K/uL   RBC 4.06  3.87 - 5.11 MIL/uL   Hemoglobin 11.8 (*) 12.0 - 15.0 g/dL   HCT 37.0  36.0 - 46.0 %   MCV 91.1  78.0 - 100.0 fL   MCH 29.1  26.0 - 34.0 pg   MCHC 31.9  30.0 - 36.0 g/dL   RDW 14.1  11.5 - 15.5 %   Platelets 160  150 - 400 K/uL   Neutrophils Relative % 57  43 - 77 %   Neutro Abs 3.6  1.7 - 7.7 K/uL   Lymphocytes Relative 35  12 - 46 %   Lymphs Abs 2.3  0.7 - 4.0 K/uL   Monocytes Relative 5  3 - 12 %   Monocytes Absolute 0.3  0.1 - 1.0 K/uL   Eosinophils Relative 2  0 - 5 %   Eosinophils Absolute 0.2  0.0 - 0.7 K/uL   Basophils Relative 1  0 - 1 %   Basophils Absolute 0.0  0.0 - 0.1 K/uL  COMPREHENSIVE METABOLIC PANEL      Result Value Ref Range   Sodium 144  137 - 147 mEq/L   Potassium 5.7 (*) 3.7 - 5.3 mEq/L   Chloride 108  96 - 112 mEq/L   CO2 23  19 - 32 mEq/L   Glucose, Bld 105 (*) 70 - 99 mg/dL   BUN 47 (*) 6 - 23 mg/dL   Creatinine, Ser 2.36 (*) 0.50 - 1.10 mg/dL   Calcium 9.2  8.4 - 10.5 mg/dL   Total Protein 7.7  6.0 - 8.3 g/dL   Albumin 3.9  3.5 - 5.2 g/dL   AST 17  0 - 37 U/L   ALT 12  0 - 35 U/L   Alkaline Phosphatase 96  39 - 117 U/L   Total Bilirubin 0.2 (*) 0.3 - 1.2 mg/dL   GFR calc non Af Amer 17 (*) >90 mL/min   GFR calc Af  Amer 20 (*) >90 mL/min  PROTIME-INR      Result Value Ref Range   Prothrombin Time 12.5  11.6 - 15.2 seconds   INR 0.95  0.00 - 1.49  URINALYSIS, ROUTINE W REFLEX MICROSCOPIC      Result Value Ref Range   Color, Urine YELLOW  YELLOW    APPearance HAZY (*) CLEAR   Specific Gravity, Urine <1.005 (*) 1.005 - 1.030   pH 6.5  5.0 - 8.0   Glucose, UA NEGATIVE  NEGATIVE mg/dL   Hgb urine dipstick SMALL (*) NEGATIVE   Bilirubin Urine NEGATIVE  NEGATIVE   Ketones, ur NEGATIVE  NEGATIVE mg/dL   Protein, ur NEGATIVE  NEGATIVE mg/dL   Urobilinogen, UA 0.2  0.0 - 1.0 mg/dL   Nitrite NEGATIVE  NEGATIVE   Leukocytes, UA NEGATIVE  NEGATIVE  URINE MICROSCOPIC-ADD ON      Result Value Ref Range   Squamous Epithelial / LPF RARE  RARE   WBC, UA 3-6  <3 WBC/hpf   RBC / HPF 0-2  <3 RBC/hpf   Bacteria, UA FEW (*) RARE  CBC      Result Value Ref Range   WBC 9.6  4.0 - 10.5 K/uL   RBC 3.52 (*) 3.87 - 5.11 MIL/uL   Hemoglobin 10.1 (*) 12.0 - 15.0 g/dL   HCT 31.7 (*) 36.0 - 46.0 %   MCV 90.1  78.0 - 100.0 fL   MCH 28.7  26.0 - 34.0 pg   MCHC 31.9  30.0 - 36.0 g/dL   RDW 14.2  11.5 - 15.5 %   Platelets 132 (*) 150 - 400 K/uL  BASIC METABOLIC PANEL      Result Value Ref Range   Sodium 146  137 - 147 mEq/L   Potassium 4.9  3.7 - 5.3 mEq/L   Chloride 112  96 - 112 mEq/L   CO2 22  19 - 32 mEq/L   Glucose, Bld 117 (*) 70 - 99 mg/dL   BUN 43 (*) 6 - 23 mg/dL   Creatinine, Ser 2.19 (*) 0.50 - 1.10 mg/dL   Calcium 8.6  8.4 - 10.5 mg/dL   GFR calc non Af Amer 19 (*) >90 mL/min   GFR calc Af Amer 22 (*) >90 mL/min  CBC WITH DIFFERENTIAL      Result Value Ref Range   WBC 8.5  4.0 - 10.5 K/uL   RBC 3.04 (*) 3.87 - 5.11 MIL/uL   Hemoglobin 8.7 (*) 12.0 - 15.0 g/dL   HCT 27.2 (*) 36.0 - 46.0 %   MCV 89.5  78.0 - 100.0 fL   MCH 28.6  26.0 - 34.0 pg   MCHC 32.0  30.0 - 36.0 g/dL   RDW 15.1  11.5 - 15.5 %   Platelets 99 (*) 150 - 400 K/uL   Neutrophils Relative % 73  43 - 77 %   Neutro Abs 6.2  1.7 - 7.7 K/uL   Lymphocytes Relative 14  12 - 46 %   Lymphs Abs 1.2  0.7 - 4.0 K/uL   Monocytes Relative 9  3 - 12 %   Monocytes Absolute 0.8  0.1 - 1.0 K/uL   Eosinophils Relative 4  0 - 5 %   Eosinophils Absolute 0.3  0.0 - 0.7 K/uL    Basophils Relative 0  0 - 1 %   Basophils Absolute 0.0  0.0 - 0.1 K/uL  BASIC METABOLIC PANEL      Result Value Ref Range  Sodium 147  137 - 147 mEq/L   Potassium 5.0  3.7 - 5.3 mEq/L   Chloride 114 (*) 96 - 112 mEq/L   CO2 23  19 - 32 mEq/L   Glucose, Bld 143 (*) 70 - 99 mg/dL   BUN 35 (*) 6 - 23 mg/dL   Creatinine, Ser 2.06 (*) 0.50 - 1.10 mg/dL   Calcium 8.1 (*) 8.4 - 10.5 mg/dL   GFR calc non Af Amer 21 (*) >90 mL/min   GFR calc Af Amer 24 (*) >90 mL/min  CBC WITH DIFFERENTIAL      Result Value Ref Range   WBC 9.0  4.0 - 10.5 K/uL   RBC 3.68 (*) 3.87 - 5.11 MIL/uL   Hemoglobin 10.8 (*) 12.0 - 15.0 g/dL   HCT 32.1 (*) 36.0 - 46.0 %   MCV 87.2  78.0 - 100.0 fL   MCH 29.3  26.0 - 34.0 pg   MCHC 33.6  30.0 - 36.0 g/dL   RDW 16.1 (*) 11.5 - 15.5 %   Platelets 89 (*) 150 - 400 K/uL   Neutrophils Relative % 73  43 - 77 %   Neutro Abs 6.6  1.7 - 7.7 K/uL   Lymphocytes Relative 13  12 - 46 %   Lymphs Abs 1.2  0.7 - 4.0 K/uL   Monocytes Relative 10  3 - 12 %   Monocytes Absolute 0.9  0.1 - 1.0 K/uL   Eosinophils Relative 4  0 - 5 %   Eosinophils Absolute 0.3  0.0 - 0.7 K/uL   Basophils Relative 0  0 - 1 %   Basophils Absolute 0.0  0.0 - 0.1 K/uL  BASIC METABOLIC PANEL      Result Value Ref Range   Sodium 147  137 - 147 mEq/L   Potassium 4.5  3.7 - 5.3 mEq/L   Chloride 115 (*) 96 - 112 mEq/L   CO2 21  19 - 32 mEq/L   Glucose, Bld 131 (*) 70 - 99 mg/dL   BUN 32 (*) 6 - 23 mg/dL   Creatinine, Ser 1.99 (*) 0.50 - 1.10 mg/dL   Calcium 8.3 (*) 8.4 - 10.5 mg/dL   GFR calc non Af Amer 21 (*) >90 mL/min   GFR calc Af Amer 25 (*) >90 mL/min  CBC WITH DIFFERENTIAL      Result Value Ref Range   WBC 8.4  4.0 - 10.5 K/uL   RBC 3.72 (*) 3.87 - 5.11 MIL/uL   Hemoglobin 10.8 (*) 12.0 - 15.0 g/dL   HCT 32.9 (*) 36.0 - 46.0 %   MCV 88.4  78.0 - 100.0 fL   MCH 29.0  26.0 - 34.0 pg   MCHC 32.8  30.0 - 36.0 g/dL   RDW 16.2 (*) 11.5 - 15.5 %   Platelets 88 (*) 150 - 400 K/uL    Neutrophils Relative % 73  43 - 77 %   Neutro Abs 6.2  1.7 - 7.7 K/uL   Lymphocytes Relative 16  12 - 46 %   Lymphs Abs 1.3  0.7 - 4.0 K/uL   Monocytes Relative 8  3 - 12 %   Monocytes Absolute 0.7  0.1 - 1.0 K/uL   Eosinophils Relative 4  0 - 5 %   Eosinophils Absolute 0.3  0.0 - 0.7 K/uL   Basophils Relative 0  0 - 1 %   Basophils Absolute 0.0  0.0 - 0.1 K/uL  BASIC METABOLIC PANEL  Result Value Ref Range   Sodium 146  137 - 147 mEq/L   Potassium 4.7  3.7 - 5.3 mEq/L   Chloride 114 (*) 96 - 112 mEq/L   CO2 22  19 - 32 mEq/L   Glucose, Bld 123 (*) 70 - 99 mg/dL   BUN 29 (*) 6 - 23 mg/dL   Creatinine, Ser 1.75 (*) 0.50 - 1.10 mg/dL   Calcium 8.7  8.4 - 10.5 mg/dL   GFR calc non Af Amer 25 (*) >90 mL/min   GFR calc Af Amer 29 (*) >90 mL/min  TYPE AND SCREEN      Result Value Ref Range   ABO/RH(D) A POS     Antibody Screen NEG     Sample Expiration 11/20/2013     Unit Number [id number]     Blood Component Type RED CELLS,LR     Unit division 00     Status of Unit ISSUED,FINAL     Transfusion Status OK TO TRANSFUSE     Crossmatch Result Compatible     Unit Number [id number]     Blood Component Type RED CELLS,LR     Unit division 00     Status of Unit ISSUED,FINAL     Transfusion Status OK TO TRANSFUSE     Crossmatch Result Compatible     Unit Number [id number]     Blood Component Type RED CELLS,LR     Unit division 00     Status of Unit ISSUED,FINAL     Transfusion Status OK TO TRANSFUSE     Crossmatch Result Compatible    PREPARE RBC (CROSSMATCH)      Result Value Ref Range   Order Confirmation ORDER PROCESSED BY BLOOD BANK    ABO/RH      Result Value Ref Range   ABO/RH(D) A POS     Dg Chest 1 View  11/17/2013   CLINICAL DATA:  Severe left hip pain, fell today  EXAM: CHEST - 1 VIEW  COMPARISON:  06/13/2011  FINDINGS: Enlargement of cardiac silhouette.  Mediastinal contours and pulmonary vascularity normal.  Bronchitic changes without infiltrate, pleural  effusion or pneumothorax.  No acute osseous findings.  Bones demineralized.  IMPRESSION: Enlargement of cardiac silhouette.  Minimal bronchitic changes without infiltrate.   Electronically Signed   By: Lavonia Dana M.D.   On: 11/17/2013 20:26   Dg Hip Complete Left  11/17/2013   CLINICAL DATA:  Left hip pain post fall today  EXAM: LEFT HIP - COMPLETE 2+ VIEW  COMPARISON:  None  FINDINGS: Osseous demineralization.  Displaced left femoral neck fracture, subcapital position.  No dislocation.  Mild narrowing of bilateral hip joints.  SI joints symmetric.  Degenerative disc disease changes lower lumbar spine.  No acute fracture, dislocation, or bone destruction.  IMPRESSION: Displaced subcapital fracture of left femoral neck.  Osseous demineralization.   Electronically Signed   By: Lavonia Dana M.D.   On: 11/17/2013 20:25   Ct Head Wo Contrast  11/17/2013   CLINICAL DATA:  Fall, left frontal hematoma, history hypertension, Alzheimer's  EXAM: CT HEAD WITHOUT CONTRAST  CT CERVICAL SPINE WITHOUT CONTRAST  TECHNIQUE: Multidetector CT imaging of the head and cervical spine was performed following the standard protocol without intravenous contrast. Multiplanar CT image reconstructions of the cervical spine were also generated.  COMPARISON:  CT head 08/15/2005  FINDINGS: CT HEAD FINDINGS  Generalized atrophy.  Normal ventricular morphology.  No midline shift or mass effect.  Question beam hardening artifacts at brainstem/pons.  No intracranial hemorrhage, mass lesion or evidence acute infarction.  No extra-axial fluid collections.  Scattered dural calcifications.  Hyperostosis frontalis interna.  Left frontoparietal scalp hematoma.  No acute osseous findings.  Paranasal sinuses clear.  CT CERVICAL SPINE FINDINGS  Visualized skullbase intact.  Osseous demineralization.  Lung apices clear.  Prevertebral soft tissues normal thickness.  Scattered facet degenerative changes.  Minimal disc space narrowing C5-C6 and C6-C7.  No  acute fracture, subluxation or bone destruction.  IMPRESSION: Generalized atrophy.  Left frontoparietal scalp hematoma.  No acute intracranial abnormalities.  Mild degenerative disc and facet disease changes of the cervical spine.  No acute cervical spine abnormalities.   Electronically Signed   By: Lavonia Dana M.D.   On: 11/17/2013 20:53   Ct Cervical Spine Wo Contrast  11/17/2013   CLINICAL DATA:  Fall, left frontal hematoma, history hypertension, Alzheimer's  EXAM: CT HEAD WITHOUT CONTRAST  CT CERVICAL SPINE WITHOUT CONTRAST  TECHNIQUE: Multidetector CT imaging of the head and cervical spine was performed following the standard protocol without intravenous contrast. Multiplanar CT image reconstructions of the cervical spine were also generated.  COMPARISON:  CT head 08/15/2005  FINDINGS: CT HEAD FINDINGS  Generalized atrophy.  Normal ventricular morphology.  No midline shift or mass effect.  Question beam hardening artifacts at brainstem/pons.  No intracranial hemorrhage, mass lesion or evidence acute infarction.  No extra-axial fluid collections.  Scattered dural calcifications.  Hyperostosis frontalis interna.  Left frontoparietal scalp hematoma.  No acute osseous findings.  Paranasal sinuses clear.  CT CERVICAL SPINE FINDINGS  Visualized skullbase intact.  Osseous demineralization.  Lung apices clear.  Prevertebral soft tissues normal thickness.  Scattered facet degenerative changes.  Minimal disc space narrowing C5-C6 and C6-C7.  No acute fracture, subluxation or bone destruction.  IMPRESSION: Generalized atrophy.  Left frontoparietal scalp hematoma.  No acute intracranial abnormalities.  Mild degenerative disc and facet disease changes of the cervical spine.  No acute cervical spine abnormalities.   Electronically Signed   By: Lavonia Dana M.D.   On: 11/17/2013 20:53     EKG Interpretation   Date/Time:  Thursday November 17 2013 21:48:23 EDT Ventricular Rate:  82 PR Interval:  186 QRS Duration:  132 QT Interval:  434 QTC Calculation: 507 R Axis:   56 Text Interpretation:  Normal sinus rhythm Right bundle branch block  Lateral infarct , age undetermined Cannot rule out Inferior infarct , age  undetermined Abnormal ECG When compared with ECG of 26-May-2013 12:57,  Minimal criteria for Inferior infarct are now Present ED PHYSICIAN  INTERPRETATION AVAILABLE IN [facility] Confirmed by TEST, Record  ([id number]) on 11/19/2013 9:37:18 AM     Medications  donepezil (ARICEPT) tablet 10 mg (10 mg Oral Given 11/20/13 2226)  metoprolol tartrate (LOPRESSOR) tablet 25 mg (25 mg Oral Given 11/20/13 2226)  0.9 %  sodium chloride infusion ( Intravenous New Bag/Given 11/20/13 1826)  hydrALAZINE (APRESOLINE) tablet 25 mg ( Oral MAR Unhold 11/18/13 1710)  ezetimibe (ZETIA) tablet 10 mg (10 mg Oral Given 11/20/13 2226)    And  simvastatin (ZOCOR) tablet 40 mg (40 mg Oral Given 11/20/13 2226)  albuterol (PROVENTIL) (2.5 MG/3ML) 0.083% nebulizer solution 2.5 mg (2.5 mg Nebulization Given 11/21/13 0702)  enoxaparin (LOVENOX) injection 40 mg (40 mg Subcutaneous Given 11/20/13 1100)  magnesium hydroxide (MILK OF MAGNESIA) suspension 30 mL (not administered)  promethazine (PHENERGAN) injection 12.5 mg (not administered)  ciprofloxacin (CIPRO) tablet 250 mg (not administered)  docusate sodium (COLACE) capsule 100 mg (not administered)  morphine 2 MG/ML injection 1 mg (not administered)  LORazepam (ATIVAN) injection 1 mg (not administered)  HYDROcodone-acetaminophen (NORCO/VICODIN) 5-325 MG per tablet 1 tablet (not administered)  ondansetron (ZOFRAN) injection 4 mg (4 mg Intravenous Given 11/17/13 1940)  ceFAZolin (ANCEF) IVPB 2 g/50 mL premix (2 g Intravenous Given 11/18/13 1257)  midazolam (VERSED) injection 1-2 mg (1 mg Intravenous Given 11/18/13 1156)  fentaNYL (SUBLIMAZE) injection 25 mcg (25 mcg Intravenous Given 11/18/13 1223)  metoprolol (LOPRESSOR) injection 5 mg (5 mg Intravenous Given 11/19/13 1142)    MDM   Final diagnoses:  Hip fracture    Ginger Blue hip fracture.  Discussed with Ortho, and Hospitalist.  Hospitalist in room, planning admit for Ortho repair in AM.  Tanna Furry, MD 11/21/13 [phone]

## 2013-11-17 NOTE — ED Notes (Signed)
Per family, pt fell on scatter rug at home. Pt hit head and has visible knot to L. forehead area. Also has extreme pain at L. Hip. L. Leg appears about 1 inch shorter than right leg and seems externally rotated. Bruising noted to L. Knee. Ice pack applied to forehead knot.

## 2013-11-17 NOTE — ED Notes (Signed)
Per EMS pt from home with c/o fall from standing, pt c/o left hip pain. No obvious deformity. Hematoma over left eye. No LOC. No blood thinners. BP 203/106.

## 2013-11-18 ENCOUNTER — Inpatient Hospital Stay (HOSPITAL_COMMUNITY): Payer: Medicare Other | Admitting: Anesthesiology

## 2013-11-18 ENCOUNTER — Encounter (HOSPITAL_COMMUNITY): Payer: Self-pay | Admitting: *Deleted

## 2013-11-18 ENCOUNTER — Inpatient Hospital Stay (HOSPITAL_COMMUNITY): Payer: Medicare Other

## 2013-11-18 ENCOUNTER — Encounter (HOSPITAL_COMMUNITY)
Admission: EM | Disposition: A | Discharge status: Skilled Nursing Facility | Payer: Self-pay | Source: Home / Self Care | Attending: Family Medicine

## 2013-11-18 ENCOUNTER — Encounter (HOSPITAL_COMMUNITY): Payer: Medicare Other | Admitting: Anesthesiology

## 2013-11-18 HISTORY — PX: HIP ARTHROPLASTY: SHX981

## 2013-11-18 LAB — BASIC METABOLIC PANEL
BUN: 43 mg/dL — ABNORMAL HIGH (ref 6–23)
CO2: 22 mEq/L (ref 19–32)
Calcium: 8.6 mg/dL (ref 8.4–10.5)
Chloride: 112 mEq/L (ref 96–112)
Creatinine, Ser: 2.19 mg/dL — ABNORMAL HIGH (ref 0.50–1.10)
GFR calc Af Amer: 22 mL/min — ABNORMAL LOW (ref >90)
GFR calc non Af Amer: 19 mL/min — ABNORMAL LOW (ref >90)
GLUCOSE: 117 mg/dL — AB (ref 70–99)
POTASSIUM: 4.9 meq/L (ref 3.7–5.3)
SODIUM: 146 meq/L (ref 137–147)

## 2013-11-18 LAB — CBC
HCT: 31.7 % — ABNORMAL LOW (ref 36.0–46.0)
HEMOGLOBIN: 10.1 g/dL — AB (ref 12.0–15.0)
MCH: 28.7 pg (ref 26.0–34.0)
MCHC: 31.9 g/dL (ref 30.0–36.0)
MCV: 90.1 fL (ref 78.0–100.0)
Platelets: 132 10*3/uL — ABNORMAL LOW (ref 150–400)
RBC: 3.52 MIL/uL — AB (ref 3.87–5.11)
RDW: 14.2 % (ref 11.5–15.5)
WBC: 9.6 10*3/uL (ref 4.0–10.5)

## 2013-11-18 LAB — PREPARE RBC (CROSSMATCH)

## 2013-11-18 LAB — ABO/RH: ABO/RH(D): A POS

## 2013-11-18 LAB — SURGICAL PCR SCREEN
MRSA, PCR: NEGATIVE
STAPHYLOCOCCUS AUREUS: NEGATIVE

## 2013-11-18 SURGERY — HEMIARTHROPLASTY, HIP, DIRECT ANTERIOR APPROACH, FOR FRACTURE
Anesthesia: Monitor Anesthesia Care | Site: Hip | Laterality: Left

## 2013-11-18 MED ORDER — MIDAZOLAM HCL 2 MG/2ML IJ SOLN
INTRAMUSCULAR | Status: AC
  Filled 2013-11-18: qty 2

## 2013-11-18 MED ORDER — SODIUM CHLORIDE 0.9 % IJ SOLN
INTRAMUSCULAR | Status: AC
  Filled 2013-11-18: qty 10

## 2013-11-18 MED ORDER — FENTANYL CITRATE 0.05 MG/ML IJ SOLN
INTRAMUSCULAR | Status: AC
  Filled 2013-11-18: qty 2

## 2013-11-18 MED ORDER — FENTANYL CITRATE 0.05 MG/ML IJ SOLN
INTRAMUSCULAR | Status: DC | PRN
  Administered 2013-11-18: 12.5 ug via INTRATHECAL

## 2013-11-18 MED ORDER — LACTATED RINGERS IV SOLN
INTRAVENOUS | Status: DC
  Administered 2013-11-18: 13:00:00 via INTRAVENOUS
  Administered 2013-11-18: 1000 mL via INTRAVENOUS

## 2013-11-18 MED ORDER — PROMETHAZINE HCL 25 MG/ML IJ SOLN: 12.5000 mg | INTRAMUSCULAR | Status: DC | PRN

## 2013-11-18 MED ORDER — EPHEDRINE SULFATE 50 MG/ML IJ SOLN
INTRAMUSCULAR | Status: AC
  Filled 2013-11-18: qty 1

## 2013-11-18 MED ORDER — SODIUM CHLORIDE 0.9 % IR SOLN
Status: DC | PRN
  Administered 2013-11-18: 1000 mL

## 2013-11-18 MED ORDER — ONDANSETRON HCL 4 MG/2ML IJ SOLN: 4.0000 mg | Freq: Once | INTRAMUSCULAR | Status: DC | PRN

## 2013-11-18 MED ORDER — EPHEDRINE SULFATE 50 MG/ML IJ SOLN
INTRAMUSCULAR | Status: DC | PRN
  Administered 2013-11-18: 5 mg via INTRAVENOUS
  Administered 2013-11-18: 10 mg via INTRAVENOUS
  Administered 2013-11-18 (×7): 5 mg via INTRAVENOUS

## 2013-11-18 MED ORDER — ALBUTEROL SULFATE (2.5 MG/3ML) 0.083% IN NEBU
2.5000 mg | INHALATION_SOLUTION | Freq: Four times a day (QID) | RESPIRATORY_TRACT | Status: AC
  Administered 2013-11-19 – 2013-11-21 (×6): 2.5 mg via RESPIRATORY_TRACT
  Filled 2013-11-18 (×9): qty 3

## 2013-11-18 MED ORDER — MIDAZOLAM HCL 5 MG/5ML IJ SOLN
INTRAMUSCULAR | Status: DC | PRN
  Administered 2013-11-18 (×3): 0.5 mg via INTRAVENOUS

## 2013-11-18 MED ORDER — FENTANYL CITRATE 0.05 MG/ML IJ SOLN
25.0000 ug | INTRAMUSCULAR | Status: AC
  Administered 2013-11-18 (×2): 25 ug via INTRAVENOUS
  Filled 2013-11-18: qty 2

## 2013-11-18 MED ORDER — BUPIVACAINE IN DEXTROSE 0.75-8.25 % IT SOLN
INTRATHECAL | Status: DC | PRN
  Administered 2013-11-18: 15 mg via INTRATHECAL

## 2013-11-18 MED ORDER — PROPOFOL 10 MG/ML IV BOLUS
INTRAVENOUS | Status: DC | PRN
  Administered 2013-11-18 (×2): 10 mg via INTRAVENOUS

## 2013-11-18 MED ORDER — PROPOFOL 10 MG/ML IV EMUL
INTRAVENOUS | Status: AC
  Filled 2013-11-18: qty 20

## 2013-11-18 MED ORDER — LIDOCAINE HCL (CARDIAC) 10 MG/ML IV SOLN
INTRAVENOUS | Status: DC | PRN
  Administered 2013-11-18: 10 mg via INTRAVENOUS

## 2013-11-18 MED ORDER — HYDROGEN PEROXIDE 3 % EX SOLN
CUTANEOUS | Status: DC | PRN
  Administered 2013-11-18: 1

## 2013-11-18 MED ORDER — CEFAZOLIN SODIUM-DEXTROSE 2-3 GM-% IV SOLR
INTRAVENOUS | Status: AC
  Filled 2013-11-18: qty 50

## 2013-11-18 MED ORDER — CEFAZOLIN SODIUM-DEXTROSE 2-3 GM-% IV SOLR
2.0000 g | Freq: Once | INTRAVENOUS | Status: DC
Start: 2013-11-18 — End: 2013-11-18
  Filled 2013-11-18: qty 50

## 2013-11-18 MED ORDER — LORAZEPAM 2 MG/ML IJ SOLN
1.0000 mg | INTRAMUSCULAR | Status: DC
  Administered 2013-11-18 – 2013-11-19 (×4): 1 mg via INTRAVENOUS
  Filled 2013-11-18 (×5): qty 1

## 2013-11-18 MED ORDER — FENTANYL CITRATE 0.05 MG/ML IJ SOLN
INTRAMUSCULAR | Status: DC | PRN
  Administered 2013-11-18: 12.5 ug via INTRAVENOUS

## 2013-11-18 MED ORDER — PROPOFOL INFUSION 10 MG/ML OPTIME
INTRAVENOUS | Status: DC | PRN
Start: 2013-11-18 — End: 2013-11-18
  Administered 2013-11-18: 10 ug/kg/min via INTRAVENOUS
  Administered 2013-11-18: 100 ug/kg/min via INTRAVENOUS

## 2013-11-18 MED ORDER — FENTANYL CITRATE 0.05 MG/ML IJ SOLN
25.0000 ug | INTRAMUSCULAR | Status: DC | PRN
Start: 2013-11-18 — End: 2013-11-18

## 2013-11-18 MED ORDER — EPINEPHRINE HCL 0.1 MG/ML IJ SOSY
PREFILLED_SYRINGE | INTRAMUSCULAR | Status: DC | PRN
  Administered 2013-11-18: .1 ug via INTRATRACHEAL

## 2013-11-18 MED ORDER — SODIUM CHLORIDE BACTERIOSTATIC 0.9 % IJ SOLN
INTRAMUSCULAR | Status: AC
  Filled 2013-11-18: qty 10

## 2013-11-18 MED ORDER — ENOXAPARIN SODIUM 40 MG/0.4ML ~~LOC~~ SOLN
40.0000 mg | SUBCUTANEOUS | Status: DC
  Administered 2013-11-19 – 2013-11-21 (×3): 40 mg via SUBCUTANEOUS
  Filled 2013-11-18 (×3): qty 0.4

## 2013-11-18 MED ORDER — CEFAZOLIN SODIUM-DEXTROSE 2-3 GM-% IV SOLR
2.0000 g | Freq: Once | INTRAVENOUS | Status: AC
  Administered 2013-11-18: 2 g via INTRAVENOUS

## 2013-11-18 MED ORDER — SEVOFLURANE IN SOLN
RESPIRATORY_TRACT | Status: AC
  Filled 2013-11-18: qty 250

## 2013-11-18 MED ORDER — MIDAZOLAM HCL 2 MG/2ML IJ SOLN
1.0000 mg | INTRAMUSCULAR | Status: AC | PRN
  Administered 2013-11-18 (×3): 1 mg via INTRAVENOUS
  Filled 2013-11-18: qty 2

## 2013-11-18 MED ORDER — MAGNESIUM HYDROXIDE 400 MG/5ML PO SUSP
30.0000 mL | Freq: Every day | ORAL | Status: DC | PRN
  Administered 2013-11-22: 30 mL via ORAL
  Filled 2013-11-18 (×2): qty 30

## 2013-11-18 MED ORDER — FENTANYL CITRATE 0.05 MG/ML IJ SOLN
25.0000 ug | INTRAMUSCULAR | Status: DC | PRN
  Administered 2013-11-18 (×4): 50 ug via INTRAVENOUS

## 2013-11-18 MED ORDER — BUPIVACAINE IN DEXTROSE 0.75-8.25 % IT SOLN
INTRATHECAL | Status: AC
  Filled 2013-11-18: qty 2

## 2013-11-18 SURGICAL SUPPLY — 56 items
BAG HAMPER (MISCELLANEOUS) ×3 IMPLANT
BLADE SAGITTAL 25.0X1.27X90 (BLADE) ×2 IMPLANT
BLADE SAGITTAL 25.0X1.27X90MM (BLADE) ×1
BLADE SURG SZ20 CARB STEEL (BLADE) ×3 IMPLANT
CHLORAPREP W/TINT 26ML (MISCELLANEOUS) ×3 IMPLANT
CLOTH BEACON ORANGE TIMEOUT ST (SAFETY) ×3 IMPLANT
COVER LIGHT HANDLE STERIS (MISCELLANEOUS) ×6 IMPLANT
COVER MAYO STAND XLG (DRAPE) ×3 IMPLANT
COVER X RAY CASSETTE (MISCELLANEOUS) ×3 IMPLANT
DRAIN PENROSE 18X.75 LTX STRL (MISCELLANEOUS) ×3 IMPLANT
DRAPE ORTHO 2.5IN SPLIT 77X108 (DRAPES) ×2 IMPLANT
DRAPE ORTHO SPLIT 77X108 STRL (DRAPES) ×4
DRAPE PROXIMA HALF (DRAPES) ×3 IMPLANT
EVACUATOR 3/16  PVC DRAIN (DRAIN) ×2
EVACUATOR 3/16 PVC DRAIN (DRAIN) ×1 IMPLANT
FACESHIELD LNG OPTICON STERILE (SAFETY) ×3 IMPLANT
FORMALIN 10 PREFIL 480ML (MISCELLANEOUS) ×3 IMPLANT
GAUZE XEROFORM 5X9 LF (GAUZE/BANDAGES/DRESSINGS) ×3 IMPLANT
GLOVE BIO SURGEON STRL SZ8 (GLOVE) ×3 IMPLANT
GLOVE BIO SURGEON STRL SZ8.5 (GLOVE) ×3 IMPLANT
GLOVE BIOGEL PI IND STRL 7.0 (GLOVE) ×2 IMPLANT
GLOVE BIOGEL PI INDICATOR 7.0 (GLOVE) ×4
GLOVE ECLIPSE 6.5 STRL STRAW (GLOVE) ×3 IMPLANT
GLOVE ECLIPSE 7.5 STRL STRAW (GLOVE) ×3 IMPLANT
GLOVE EXAM NITRILE MD LF STRL (GLOVE) ×3 IMPLANT
GOWN STRL REUS W/TWL LRG LVL3 (GOWN DISPOSABLE) ×9 IMPLANT
GOWN STRL REUS W/TWL XL LVL3 (GOWN DISPOSABLE) ×3 IMPLANT
HANDPIECE INTERPULSE COAX TIP (DISPOSABLE)
HIP BIPOLAR TANDEM POROUS ×3 IMPLANT
INST SET MAJOR BONE (KITS) ×3 IMPLANT
IV NS IRRIG 3000ML ARTHROMATIC (IV SOLUTION) IMPLANT
KIT BLADEGUARD II DBL (SET/KITS/TRAYS/PACK) ×3 IMPLANT
KIT ROOM TURNOVER APOR (KITS) ×3 IMPLANT
MANIFOLD NEPTUNE II (INSTRUMENTS) ×3 IMPLANT
MARKER SKIN DUAL TIP RULER LAB (MISCELLANEOUS) ×3 IMPLANT
NS IRRIG 1000ML POUR BTL (IV SOLUTION) ×3 IMPLANT
PACK TOTAL JOINT (CUSTOM PROCEDURE TRAY) ×3 IMPLANT
PAD ABD 5X9 TENDERSORB (GAUZE/BANDAGES/DRESSINGS) ×6 IMPLANT
PAD ARMBOARD 7.5X6 YLW CONV (MISCELLANEOUS) ×3 IMPLANT
PILLOW HIP ABDUCTION LRG (ORTHOPEDIC SUPPLIES) ×3 IMPLANT
PILLOW HIP ABDUCTION MED (ORTHOPEDIC SUPPLIES) IMPLANT
SET BASIN LINEN APH (SET/KITS/TRAYS/PACK) ×3 IMPLANT
SET HNDPC FAN SPRY TIP SCT (DISPOSABLE) IMPLANT
SPONGE DRAIN TRACH 4X4 STRL 2S (GAUZE/BANDAGES/DRESSINGS) ×3 IMPLANT
SPONGE GAUZE 4X4 12PLY (GAUZE/BANDAGES/DRESSINGS) ×3 IMPLANT
STAPLER VISISTAT 35W (STAPLE) ×3 IMPLANT
SUT BRALON NAB BRD #1 30IN (SUTURE) ×12 IMPLANT
SUT CHROMIC 1 CTX 36 (SUTURE) ×12 IMPLANT
SUT PLAIN 2 0 XLH (SUTURE) ×15 IMPLANT
SUT SILK 0 FSL (SUTURE) ×3 IMPLANT
SWAB CULTURE LIQ STUART DBL (MISCELLANEOUS) ×3 IMPLANT
TAPE MEDIFIX FOAM 3 (GAUZE/BANDAGES/DRESSINGS) ×3 IMPLANT
TOWER CARTRIDGE SMART MIX (DISPOSABLE) IMPLANT
TRAY FOLEY CATH 16FR SILVER (SET/KITS/TRAYS/PACK) ×3 IMPLANT
WATER STERILE IRR 1000ML POUR (IV SOLUTION) ×6 IMPLANT
YANKAUER SUCT 12FT TUBE ARGYLE (SUCTIONS) ×3 IMPLANT

## 2013-11-18 NOTE — Transfer of Care (Signed)
Immediate Anesthesia Transfer of Care Note  Patient: [name]  Procedure(s) Performed: Procedure(s) (LRB): ARTHROPLASTY BIPOLAR HIP (Left)  Patient Location: PACU  Anesthesia Type: SAB  Level of Consciousness: awake  Airway & Oxygen Therapy: Patient Spontanous Breathing and nasal cannula  Post-op Assessment: Report given to PACU RN, Post -op Vital signs reviewed and stable. SAB Level  L2 Post vital signs: Reviewed and stable  Complications: No apparent anesthesia complications

## 2013-11-18 NOTE — Anesthesia Postprocedure Evaluation (Signed)
Anesthesia Post Note  Patient: [name]  Procedure(s) Performed: Procedure(s) (LRB): ARTHROPLASTY BIPOLAR HIP (Left)  Anesthesia type: Spinal  Patient location: PACU  Post pain: Pain level controlled  Post assessment: Post-op Vital signs reviewed, Patient's Cardiovascular Status Stable, Respiratory Function Stable, Patent Airway, No signs of Nausea or vomiting and Pain level controlled  Last Vitals:  Filed Vitals:   11/18/13 1446  BP: 144/53  Pulse: 95  Temp: 36.7 C  Resp: 18    Post vital signs: Reviewed and stable  Level of consciousness: awake and alert   Complications: No apparent anesthesia complications

## 2013-11-18 NOTE — Progress Notes (Signed)
Subjective: The patient is fairly comfortable as morning. She had a fall at home. She has a displaced subcapital fracture of the left femoral neck.  Objective: Vital signs in last 24 hours: Temp:  [98.1 F (36.7 C)-99.1 F (37.3 C)] 98.1 F (36.7 C) (03/20 0547) Pulse Rate:  [73-77] 73 (03/20 0547) Resp:  [21-22] 21 (03/20 0547) BP: (148-186)/(45-94) 148/45 mmHg (03/20 0547) SpO2:  [92 %-100 %] 92 % (03/20 0547) Weight change:  Last BM Date: 11/16/13  Intake/Output from previous day: 03/19 0701 - 03/20 0700 In: -  Out: 750 [Urine:750] Intake/Output this shift: Total I/O In: -  Out: 750 [Urine:750]  Physical Exam: HEENT negative patient has small hematoma left for head  Neck supple no JVD or thyroid abnormalities  Heart regular rhythm no murmurs  Lungs clear P&A  Abdomen no palpable organs or masses   Extremities shortening of the left leg and tenderness over the hip   Recent Labs  11/17/13 1921  WBC 6.4  HGB 11.8*  HCT 37.0  PLT 160   BMET  Recent Labs  11/17/13 1921  NA 144  K 5.7*  CL 108  CO2 23  GLUCOSE 105*  BUN 47*  CREATININE 2.36*  CALCIUM 9.2    Studies/Results: Dg Chest 1 View  11/17/2013   CLINICAL DATA:  Severe left hip pain, fell today  EXAM: CHEST - 1 VIEW  COMPARISON:  06/13/2011  FINDINGS: Enlargement of cardiac silhouette.  Mediastinal contours and pulmonary vascularity normal.  Bronchitic changes without infiltrate, pleural effusion or pneumothorax.  No acute osseous findings.  Bones demineralized.  IMPRESSION: Enlargement of cardiac silhouette.  Minimal bronchitic changes without infiltrate.   Electronically Signed   By: Lavonia Dana M.D.   On: 11/17/2013 20:26   Dg Hip Complete Left  11/17/2013   CLINICAL DATA:  Left hip pain post fall today  EXAM: LEFT HIP - COMPLETE 2+ VIEW  COMPARISON:  None  FINDINGS: Osseous demineralization.  Displaced left femoral neck fracture, subcapital position.  No dislocation.  Mild narrowing of  bilateral hip joints.  SI joints symmetric.  Degenerative disc disease changes lower lumbar spine.  No acute fracture, dislocation, or bone destruction.  IMPRESSION: Displaced subcapital fracture of left femoral neck.  Osseous demineralization.   Electronically Signed   By: Lavonia Dana M.D.   On: 11/17/2013 20:25   Ct Head Wo Contrast  11/17/2013   CLINICAL DATA:  Fall, left frontal hematoma, history hypertension, Alzheimer's  EXAM: CT HEAD WITHOUT CONTRAST  CT CERVICAL SPINE WITHOUT CONTRAST  TECHNIQUE: Multidetector CT imaging of the head and cervical spine was performed following the standard protocol without intravenous contrast. Multiplanar CT image reconstructions of the cervical spine were also generated.  COMPARISON:  CT head 08/15/2005  FINDINGS: CT HEAD FINDINGS  Generalized atrophy.  Normal ventricular morphology.  No midline shift or mass effect.  Question beam hardening artifacts at brainstem/pons.  No intracranial hemorrhage, mass lesion or evidence acute infarction.  No extra-axial fluid collections.  Scattered dural calcifications.  Hyperostosis frontalis interna.  Left frontoparietal scalp hematoma.  No acute osseous findings.  Paranasal sinuses clear.  CT CERVICAL SPINE FINDINGS  Visualized skullbase intact.  Osseous demineralization.  Lung apices clear.  Prevertebral soft tissues normal thickness.  Scattered facet degenerative changes.  Minimal disc space narrowing C5-C6 and C6-C7.  No acute fracture, subluxation or bone destruction.  IMPRESSION: Generalized atrophy.  Left frontoparietal scalp hematoma.  No acute intracranial abnormalities.  Mild degenerative disc and facet disease changes  of the cervical spine.  No acute cervical spine abnormalities.   Electronically Signed   By: Lavonia Dana M.D.   On: 11/17/2013 20:53   Ct Cervical Spine Wo Contrast  11/17/2013   CLINICAL DATA:  Fall, left frontal hematoma, history hypertension, Alzheimer's  EXAM: CT HEAD WITHOUT CONTRAST  CT CERVICAL  SPINE WITHOUT CONTRAST  TECHNIQUE: Multidetector CT imaging of the head and cervical spine was performed following the standard protocol without intravenous contrast. Multiplanar CT image reconstructions of the cervical spine were also generated.  COMPARISON:  CT head 08/15/2005  FINDINGS: CT HEAD FINDINGS  Generalized atrophy.  Normal ventricular morphology.  No midline shift or mass effect.  Question beam hardening artifacts at brainstem/pons.  No intracranial hemorrhage, mass lesion or evidence acute infarction.  No extra-axial fluid collections.  Scattered dural calcifications.  Hyperostosis frontalis interna.  Left frontoparietal scalp hematoma.  No acute osseous findings.  Paranasal sinuses clear.  CT CERVICAL SPINE FINDINGS  Visualized skullbase intact.  Osseous demineralization.  Lung apices clear.  Prevertebral soft tissues normal thickness.  Scattered facet degenerative changes.  Minimal disc space narrowing C5-C6 and C6-C7.  No acute fracture, subluxation or bone destruction.  IMPRESSION: Generalized atrophy.  Left frontoparietal scalp hematoma.  No acute intracranial abnormalities.  Mild degenerative disc and facet disease changes of the cervical spine.  No acute cervical spine abnormalities.   Electronically Signed   By: Lavonia Dana M.D.   On: 11/17/2013 20:53    Medications:  . donepezil  10 mg Oral QHS  . ezetimibe  10 mg Oral QHS   And  . simvastatin  40 mg Oral QHS  . metoprolol tartrate  25 mg Oral BID    . sodium chloride 75 mL/hr (11/18/13 0044)     Assessment/Plan: Subcapital fracture of left hip with displacement  Alzheimer's dementia plan the patient will be seen by orthopedic surgeons today and further treatment will be planned. Continue current medications   LOS: 1 day   Dolly Harbach G 11/18/2013, 6:09 AM

## 2013-11-18 NOTE — Progress Notes (Signed)
Awake. Restless. Removed right leg from abduction pillow. Yelling. Restless. C/O left hip pain. Med as noted.

## 2013-11-18 NOTE — Progress Notes (Signed)
#  1 unit PRBCs completed. IV tubing flushed with saline. IV site to left forearm saline locked. No reaction present. Tolerated well.

## 2013-11-18 NOTE — Clinical Social Work Note (Signed)
CSW presented bed offer at [facility] which was family's preference and they accept. Facility notified. Will continue to follow.  Benay Pike, [place]

## 2013-11-18 NOTE — Clinical Social Work Placement (Signed)
Clinical Social Work Department CLINICAL SOCIAL WORK PLACEMENT NOTE 11/18/2013  Patient:  [name], [name]  Account Number:  [id number] Admit date:  11/17/2013  Clinical Social Worker:  Delfina Redwood, [place] Intern  Date/time:  11/18/2013 10:42 AM  Clinical Social Work is seeking post-discharge placement for this patient at the following level of care:   SKILLED NURSING   (*CSW will update this form in Epic as items are completed)   11/18/2013  Patient/family provided with [place] Department of Clinical Social Work's list of facilities offering this level of care within the geographic area requested by the patient (or if unable, by the patient's family).  11/18/2013  Patient/family informed of their freedom to choose among providers that offer the needed level of care, that participate in Medicare, Medicaid or managed care program needed by the patient, have an available bed and are willing to accept the patient.  11/18/2013  Patient/family informed of [facility]' ownership interest in [facility], as well as of the fact that they are under no obligation to receive care at this facility.  PASARR submitted to [employer] on 11/18/2013 PASARR number received from [employer] on 11/18/2013  FL2 transmitted to all facilities in geographic area requested by pt/family on  11/18/2013 FL2 transmitted to all facilities within larger geographic area on 11/18/2013  Patient informed that his/her managed care company has contracts with or will negotiate with  certain facilities, including the following:     Patient/family informed of bed offers received:   Patient chooses bed at  Physician recommends and patient chooses bed at    Patient to be transferred to  on   Patient to be transferred to facility by   The following physician request were entered in Epic:   Additional Comments:     [place] Intern

## 2013-11-18 NOTE — Brief Op Note (Signed)
11/17/2013 - 11/18/2013  2:34 PM  PATIENT:  [name]  80 y.o. female  PRE-OPERATIVE DIAGNOSIS:  Fracture Left Hip subcapital  POST-OPERATIVE DIAGNOSIS:  Fracture Left Hip  PROCEDURE:  Procedure(s): ARTHROPLASTY BIPOLAR HIP (Left)  SURGEON:  Surgeon(s) and Role:    * Sanjuana Kava, MD - Primary  PHYSICIAN ASSISTANT:   ASSISTANTS: C. Page, RN   ANESTHESIA:   spinal  EBL:  Total I/O In: 1750 [I.V.:1750] Out: 350 [Urine:150; Blood:200]  BLOOD ADMINISTERED:none  DRAINS: (Large) Hemovact drain(s) in the left hip area with  Suction Open   LOCAL MEDICATIONS USED:  NONE  SPECIMEN:  Source of Specimen:  Left femoral head  DISPOSITION OF SPECIMEN:  PATHOLOGY  COUNTS:  YES  TOURNIQUET:  * No tourniquets in log *  DICTATION: .Other Dictation: Dictation Number [id number]  PLAN OF CARE: Admit to inpatient   PATIENT DISPOSITION:  PACU - hemodynamically stable.   Delay start of Pharmacological VTE agent (>24hrs) due to surgical blood loss or risk of bleeding: no

## 2013-11-18 NOTE — Anesthesia Preprocedure Evaluation (Signed)
Anesthesia Evaluation  Patient identified by MRN, date of birth, ID band Patient awake    Reviewed: Allergy & Precautions, H&P , NPO status , Patient's Chart, lab work & pertinent test results, reviewed documented beta blocker date and time   Airway Mallampati: III TM Distance: >3 FB     Dental  (+) Teeth Intact, Poor Dentition   Pulmonary neg pulmonary ROS,  breath sounds clear to auscultation        Cardiovascular hypertension, Pt. on medications and Pt. on home beta blockers Rhythm:Regular Rate:Normal     Neuro/Psych PSYCHIATRIC DISORDERS (Alzheimers Dementia, Ox0)    GI/Hepatic negative GI ROS,   Endo/Other    Renal/GU Renal InsufficiencyRenal disease     Musculoskeletal   Abdominal   Peds  Hematology   Anesthesia Other Findings   Reproductive/Obstetrics                           Anesthesia Physical Anesthesia Plan  ASA: III  Anesthesia Plan: Spinal and MAC   Post-op Pain Management:    Induction: Intravenous  Airway Management Planned: Nasal Cannula  Additional Equipment:   Intra-op Plan:   Post-operative Plan:   Informed Consent: I have reviewed the patients History and Physical, chart, labs and discussed the procedure including the risks, benefits and alternatives for the proposed anesthesia with the patient or authorized representative who has indicated his/her understanding and acceptance.     Plan Discussed with:   Anesthesia Plan Comments:         Anesthesia Quick Evaluation

## 2013-11-18 NOTE — Addendum Note (Signed)
Addendum created 11/18/13 1519 by Vista Deck, CRNA   Modules edited: Anesthesia LDA, Notes Section, Orders, PRL Based Order Sets   Notes Section:  File: [id number]

## 2013-11-18 NOTE — Op Note (Signed)
[name], [name]                 ACCOUNT NO.:  [id number]  MEDICAL RECORD NO.:  [id number]  LOCATION:  A330                          FACILITY:  [facility]  PHYSICIAN:  J. Sanjuana Guzman, M.D. DATE OF BIRTH:  [birth year]  DATE OF PROCEDURE: DATE OF DISCHARGE:                              OPERATIVE REPORT   PREOPERATIVE DIAGNOSIS:  Subcapital fracture of the left hip, displaced.  POSTOPERATIVE DIAGNOSIS:  Subcapital fracture of the left hip, displaced.  PROCEDURE:  Bipolar hip replacement on the left hip with a 45 head, +4 neck, 13 stem.  ANESTHESIA:  Spinal.  ESTIMATED BLOOD LOSS:  300 mL, 1 unit will be given in the PACU, 1 large Hemovac drain.  SURGEON:  J. Sanjuana Guzman, M.D.  ASSISTANT:  Lorraine Chapel Page, RN.  INDICATIONS:  The patient fell at her home last night.  She lives with her daughter.  The patient has dementia.  They also have a sitter that stays with her and someone is with her around the o'clock.  X-rays revealed a displaced subcapital fracture of the hip.  The patient has kidney disease.  She was seen by the hospitalist and found to be an acceptable at risk patient for the procedure.  I discussed the risk and imponderables with the patient's daughter.  She appeared to understand and asked appropriate questions.  She understands most likely need a skilled nursing facility after discharge.  DESCRIPTION OF PROCEDURE:  The patient was seen in the holding area and the left hip was identified as correct surgical site.  The patient was complaining of pain to the left hip and the daughter states the left hip that was given her mother the problem.  I placed a mark on the left hip. The patient was brought to the operating room, given spinal anesthesia. Then, she was placed supine on the operating room table and then transferred to the right side down and the side up decubitus position, held in place by supports.  Hip was freed to be moved by the assistant. The patient was  then prepped and draped in the usual manner.  Had a generalized time-out identifying the patient as [name] and we are doing left bipolar hip.  All instrumentation was properly positioned and working.  The OR team knew each other.  Incision was made through the skin and subcutaneous tissue.  There was considerable amount of adipose tissue, a good 3 inches.  The fascial layer was identified and then the sciatic nerve was identified and a Penrose drain placed around the sciatic nerve.  Sciatic nerve was protected through the remainder part of the case.  Short external rotator was identified, tagged and cut.  Hip capsule was exposed and opened, and the culture was obtained.  Femoral head was exposed and removed with a corkscrew.  The femoral head measured 45 mm.  Femoral neck was prepared first with reamers and then with the rasp.  Femoral neck was prepared.  Trial reduction was carried out and that a 45 head, +4 neck and a 13 stem would do best.  These were selected and 13 stem was inserted, +4 neck and then a 45 head.  Hip was reduced.  X-rays were taken and looked very good in one single AP view.  Short external rotators were reapproximated through previously applied tags.  Hemovac drain was placed and sewn in with 2-0 silk suture.  Fascial layer was reapproximated using figure-of-eight #1 Surgilon suture.  The patient had a large amount of adipose tissue, this was closed in layers using 2- 0 plain.  Skin was reapproximated with skin staples.  Sterile dressing applied.  The patient tolerated the procedure well.  She had an abduction pillow between her knees.  She was admitted of course.  She will go to recovery in good condition.  We planned to give her a unit of blood in recovery.          ______________________________ [name], M.D.     JWK/MEDQ  D:  11/18/2013  T:  11/18/2013  Job:  [id number]

## 2013-11-18 NOTE — Anesthesia Procedure Notes (Addendum)
Procedure Name: MAC Date/Time: 11/18/2013 12:27 PM Performed by: Vista Deck Pre-anesthesia Checklist: Patient identified, Emergency Drugs available, Suction available, Timeout performed and Patient being monitored Patient Re-evaluated:Patient Re-evaluated prior to inductionOxygen Delivery Method: Nasal Cannula    Procedure Name: MAC Date/Time: 11/18/2013 1:00 PM Performed by: Vista Deck Pre-anesthesia Checklist: Patient identified, Emergency Drugs available, Suction available, Timeout performed and Patient being monitored Patient Re-evaluated:Patient Re-evaluated prior to inductionOxygen Delivery Method: Non-rebreather mask    Spinal  Patient location during procedure: OR Start time: 11/18/2013 12:36 PM End time: 11/18/2013 12:42 PM Staffing CRNA/Resident: Drucie Opitz S Preanesthetic Checklist Completed: patient identified, site marked, surgical consent, pre-op evaluation, timeout performed, IV checked, risks and benefits discussed and monitors and equipment checked Spinal Block Patient position: left lateral decubitus Prep: Betadine Patient monitoring: heart rate, cardiac monitor, continuous pulse ox and blood pressure Approach: left paramedian Location: L3-4 Injection technique: single-shot Needle Needle type: Spinocan  Needle gauge: 22 G Needle length: 9 cm Assessment Sensory level: T6 Additional Notes Betadine prep x3 1% lidocaine skinwheal  Clear CSF pre and post injection  ATTEMPTS: 2 TRAY ID: [id number] TRAY EXPIRATION DATE: 2016-01

## 2013-11-18 NOTE — Anesthesia Postprocedure Evaluation (Signed)
  Anesthesia Post-op Note  Patient: [name]  Procedure(s) Performed: Procedure(s): ARTHROPLASTY BIPOLAR HIP (Left)  Patient Location: PACU  Anesthesia Type:Spinal  Level of Consciousness: awake and patient cooperative  Airway and Oxygen Therapy: Patient Spontanous Breathing and Patient connected to nasal cannula oxygen  Post-op Pain: none  Post-op Assessment: Post-op Vital signs reviewed, Patient's Cardiovascular Status Stable, Respiratory Function Stable and No signs of Nausea or vomiting  Post-op Vital Signs: Reviewed and stable  Complications: No apparent anesthesia complications. Receiving 1 unit RBC'S

## 2013-11-18 NOTE — Progress Notes (Signed)
Called [facility] to give report. Unable to take report at this time.

## 2013-11-18 NOTE — Consult Note (Signed)
Reason for Consult:Fracture of the left hip Referring Physician: Hospitalist  [name] is an 80 y.o. female.  HPI: Patient lives with her daughter and a Actuary.  Patient has long history of dementia and confusion.  She has done well in the home situation.  Last night she fell and hurt her left hip.  She could not stand or move.  X-rays show a displaced left subcapital fracture of the hip.  She will need a bipolar hip replacement.  I have talked to the daughter this morning.  Patient unable to give history secondary to marked confusion.  I got history from daughter.  Her mother moves about the house well with no problem.  I have told the daughter the findings and need for surgery of the left hip.   I have gone over risks and imponderables including infection, embolus which could cause death, possible need for blood transfusion, need for physical therapy, need for skilled nursing home placement after discharge here, and anesthesia risks.  Her mother may not be able to understand physical therapy instructions and directions.  She appears to understand and asked appropriate questions.  She understands her mother is at increased risk.  I plan to do the procedure later today around noon or so.  Past Medical History  Diagnosis Date  . Hypertension   . Arthritis   . Alzheimer disease     Past Surgical History  Procedure Laterality Date  . Cholecystectomy      History reviewed. No pertinent family history.  Social History:  has no tobacco, alcohol, and drug history on file.  Allergies:  Allergies  Allergen Reactions  . Sulfa Antibiotics Rash    Medications: I have reviewed the patient's current medications.  Results for orders placed during the hospital encounter of 11/17/13 (from the past 48 hour(s))  CBC WITH DIFFERENTIAL     Status: Abnormal   Collection Time    11/17/13  7:21 PM      Result Value Ref Range   WBC 6.4  4.0 - 10.5 K/uL   RBC 4.06  3.87 - 5.11 MIL/uL   Hemoglobin  11.8 (*) 12.0 - 15.0 g/dL   HCT 37.0  36.0 - 46.0 %   MCV 91.1  78.0 - 100.0 fL   MCH 29.1  26.0 - 34.0 pg   MCHC 31.9  30.0 - 36.0 g/dL   RDW 14.1  11.5 - 15.5 %   Platelets 160  150 - 400 K/uL   Neutrophils Relative % 57  43 - 77 %   Neutro Abs 3.6  1.7 - 7.7 K/uL   Lymphocytes Relative 35  12 - 46 %   Lymphs Abs 2.3  0.7 - 4.0 K/uL   Monocytes Relative 5  3 - 12 %   Monocytes Absolute 0.3  0.1 - 1.0 K/uL   Eosinophils Relative 2  0 - 5 %   Eosinophils Absolute 0.2  0.0 - 0.7 K/uL   Basophils Relative 1  0 - 1 %   Basophils Absolute 0.0  0.0 - 0.1 K/uL  COMPREHENSIVE METABOLIC PANEL     Status: Abnormal   Collection Time    11/17/13  7:21 PM      Result Value Ref Range   Sodium 144  137 - 147 mEq/L   Potassium 5.7 (*) 3.7 - 5.3 mEq/L   Chloride 108  96 - 112 mEq/L   CO2 23  19 - 32 mEq/L   Glucose, Bld 105 (*) 70 -  99 mg/dL   BUN 47 (*) 6 - 23 mg/dL   Creatinine, Ser 2.36 (*) 0.50 - 1.10 mg/dL   Calcium 9.2  8.4 - 10.5 mg/dL   Total Protein 7.7  6.0 - 8.3 g/dL   Albumin 3.9  3.5 - 5.2 g/dL   AST 17  0 - 37 U/L   ALT 12  0 - 35 U/L   Alkaline Phosphatase 96  39 - 117 U/L   Total Bilirubin 0.2 (*) 0.3 - 1.2 mg/dL   GFR calc non Af Amer 17 (*) >90 mL/min   GFR calc Af Amer 20 (*) >90 mL/min   Comment: (NOTE)     The eGFR has been calculated using the CKD EPI equation.     This calculation has not been validated in all clinical situations.     eGFR's persistently <90 mL/min signify possible Chronic Kidney     Disease.  PROTIME-INR     Status: None   Collection Time    11/17/13  7:21 PM      Result Value Ref Range   Prothrombin Time 12.5  11.6 - 15.2 seconds   INR 0.95  0.00 - 1.49  TYPE AND SCREEN     Status: None   Collection Time    11/17/13  7:21 PM      Result Value Ref Range   ABO/RH(D) A POS     Antibody Screen NEG     Sample Expiration 11/20/2013    URINALYSIS, ROUTINE W REFLEX MICROSCOPIC     Status: Abnormal   Collection Time    11/17/13  8:42 PM       Result Value Ref Range   Color, Urine YELLOW  YELLOW   APPearance HAZY (*) CLEAR   Specific Gravity, Urine <1.005 (*) 1.005 - 1.030   pH 6.5  5.0 - 8.0   Glucose, UA NEGATIVE  NEGATIVE mg/dL   Hgb urine dipstick SMALL (*) NEGATIVE   Bilirubin Urine NEGATIVE  NEGATIVE   Ketones, ur NEGATIVE  NEGATIVE mg/dL   Protein, ur NEGATIVE  NEGATIVE mg/dL   Urobilinogen, UA 0.2  0.0 - 1.0 mg/dL   Nitrite NEGATIVE  NEGATIVE   Leukocytes, UA NEGATIVE  NEGATIVE  URINE MICROSCOPIC-ADD ON     Status: Abnormal   Collection Time    11/17/13  8:42 PM      Result Value Ref Range   Squamous Epithelial / LPF RARE  RARE   WBC, UA 3-6  <3 WBC/hpf   RBC / HPF 0-2  <3 RBC/hpf   Bacteria, UA FEW (*) RARE  CBC     Status: Abnormal   Collection Time    11/18/13  5:50 AM      Result Value Ref Range   WBC 9.6  4.0 - 10.5 K/uL   RBC 3.52 (*) 3.87 - 5.11 MIL/uL   Hemoglobin 10.1 (*) 12.0 - 15.0 g/dL   HCT 31.7 (*) 36.0 - 46.0 %   MCV 90.1  78.0 - 100.0 fL   MCH 28.7  26.0 - 34.0 pg   MCHC 31.9  30.0 - 36.0 g/dL   RDW 14.2  11.5 - 15.5 %   Platelets 132 (*) 150 - 400 K/uL  BASIC METABOLIC PANEL     Status: Abnormal   Collection Time    11/18/13  5:50 AM      Result Value Ref Range   Sodium 146  137 - 147 mEq/L   Potassium 4.9  3.7 - 5.3 mEq/L  Chloride 112  96 - 112 mEq/L   CO2 22  19 - 32 mEq/L   Glucose, Bld 117 (*) 70 - 99 mg/dL   BUN 43 (*) 6 - 23 mg/dL   Creatinine, Ser 7.66 (*) 0.50 - 1.10 mg/dL   Calcium 8.6  8.4 - 66.2 mg/dL   GFR calc non Af Amer 19 (*) >90 mL/min   GFR calc Af Amer 22 (*) >90 mL/min   Comment: (NOTE)     The eGFR has been calculated using the CKD EPI equation.     This calculation has not been validated in all clinical situations.     eGFR's persistently <90 mL/min signify possible Chronic Kidney     Disease.    Dg Chest 1 View  11/17/2013   CLINICAL DATA:  Severe left hip pain, fell today  EXAM: CHEST - 1 VIEW  COMPARISON:  06/13/2011  FINDINGS: Enlargement of  cardiac silhouette.  Mediastinal contours and pulmonary vascularity normal.  Bronchitic changes without infiltrate, pleural effusion or pneumothorax.  No acute osseous findings.  Bones demineralized.  IMPRESSION: Enlargement of cardiac silhouette.  Minimal bronchitic changes without infiltrate.   Electronically Signed   By: Ulyses Southward M.D.   On: 11/17/2013 20:26   Dg Hip Complete Left  11/17/2013   CLINICAL DATA:  Left hip pain post fall today  EXAM: LEFT HIP - COMPLETE 2+ VIEW  COMPARISON:  None  FINDINGS: Osseous demineralization.  Displaced left femoral neck fracture, subcapital position.  No dislocation.  Mild narrowing of bilateral hip joints.  SI joints symmetric.  Degenerative disc disease changes lower lumbar spine.  No acute fracture, dislocation, or bone destruction.  IMPRESSION: Displaced subcapital fracture of left femoral neck.  Osseous demineralization.   Electronically Signed   By: Ulyses Southward M.D.   On: 11/17/2013 20:25   Ct Head Wo Contrast  11/17/2013   CLINICAL DATA:  Fall, left frontal hematoma, history hypertension, Alzheimer's  EXAM: CT HEAD WITHOUT CONTRAST  CT CERVICAL SPINE WITHOUT CONTRAST  TECHNIQUE: Multidetector CT imaging of the head and cervical spine was performed following the standard protocol without intravenous contrast. Multiplanar CT image reconstructions of the cervical spine were also generated.  COMPARISON:  CT head 08/15/2005  FINDINGS: CT HEAD FINDINGS  Generalized atrophy.  Normal ventricular morphology.  No midline shift or mass effect.  Question beam hardening artifacts at brainstem/pons.  No intracranial hemorrhage, mass lesion or evidence acute infarction.  No extra-axial fluid collections.  Scattered dural calcifications.  Hyperostosis frontalis interna.  Left frontoparietal scalp hematoma.  No acute osseous findings.  Paranasal sinuses clear.  CT CERVICAL SPINE FINDINGS  Visualized skullbase intact.  Osseous demineralization.  Lung apices clear.  Prevertebral  soft tissues normal thickness.  Scattered facet degenerative changes.  Minimal disc space narrowing C5-C6 and C6-C7.  No acute fracture, subluxation or bone destruction.  IMPRESSION: Generalized atrophy.  Left frontoparietal scalp hematoma.  No acute intracranial abnormalities.  Mild degenerative disc and facet disease changes of the cervical spine.  No acute cervical spine abnormalities.   Electronically Signed   By: Ulyses Southward M.D.   On: 11/17/2013 20:53   Ct Cervical Spine Wo Contrast  11/17/2013   CLINICAL DATA:  Fall, left frontal hematoma, history hypertension, Alzheimer's  EXAM: CT HEAD WITHOUT CONTRAST  CT CERVICAL SPINE WITHOUT CONTRAST  TECHNIQUE: Multidetector CT imaging of the head and cervical spine was performed following the standard protocol without intravenous contrast. Multiplanar CT image reconstructions of the cervical spine were  also generated.  COMPARISON:  CT head 08/15/2005  FINDINGS: CT HEAD FINDINGS  Generalized atrophy.  Normal ventricular morphology.  No midline shift or mass effect.  Question beam hardening artifacts at brainstem/pons.  No intracranial hemorrhage, mass lesion or evidence acute infarction.  No extra-axial fluid collections.  Scattered dural calcifications.  Hyperostosis frontalis interna.  Left frontoparietal scalp hematoma.  No acute osseous findings.  Paranasal sinuses clear.  CT CERVICAL SPINE FINDINGS  Visualized skullbase intact.  Osseous demineralization.  Lung apices clear.  Prevertebral soft tissues normal thickness.  Scattered facet degenerative changes.  Minimal disc space narrowing C5-C6 and C6-C7.  No acute fracture, subluxation or bone destruction.  IMPRESSION: Generalized atrophy.  Left frontoparietal scalp hematoma.  No acute intracranial abnormalities.  Mild degenerative disc and facet disease changes of the cervical spine.  No acute cervical spine abnormalities.   Electronically Signed   By: Lavonia Dana M.D.   On: 11/17/2013 20:53    Review of  Systems  Cardiovascular:       History of hypertension.  Genitourinary:       History of kidney disease.  Musculoskeletal: Positive for falls (Fell last night at home.  Hurt left hip.).  Psychiatric/Behavioral:       History of Alzheimer's    Blood pressure 148/45, pulse 73, temperature 98.1 F (36.7 C), temperature source Axillary, resp. rate 21, height $RemoveBe'5\' 2"'TnAiqTMaB$  (1.575 m), SpO2 92.00%. Physical Exam  Constitutional: She appears well-developed.  HENT:  Head: Normocephalic and atraumatic.  Eyes: Conjunctivae are normal. Pupils are equal, round, and reactive to light.  Neck: Normal range of motion. Neck supple.  Cardiovascular: Normal rate, regular rhythm and intact distal pulses.   Respiratory: Effort normal.  GI: Soft.  Musculoskeletal: She exhibits tenderness (Pain left hip with external rotation and shortening.  NV intact.).       Legs: Neurological: She is alert. She has normal reflexes.  Not aware of location, date, person, time.  Knows her daughter.  Skin: Skin is warm.  Psychiatric: She has a normal mood and affect. Her behavior is normal. Judgment normal.  Confused, chronic state.    Assessment/Plan: Subcapital fracture of the left hip with displacement.  For bipolar hip replacement left. Dementia History of hypertension History of renal disease.  Daviyon Widmayer 11/18/2013, 7:15 AM

## 2013-11-18 NOTE — Preoperative (Signed)
Beta Blockers   Reason not to administer Beta Blockers:Received this morning [phone]

## 2013-11-18 NOTE — Clinical Social Work Placement (Signed)
Clinical Social Work Department CLINICAL SOCIAL WORK PLACEMENT NOTE 11/18/2013  Patient:  [name], [name]  Account Number:  [id number] Admit date:  11/17/2013  Clinical Social Worker:  Raquel Sarna SZPYRKA, CLINICAL SOCIAL WORKER  Date/time:  11/18/2013 10:42 AM  Clinical Social Work is seeking post-discharge placement for this patient at the following level of care:   [place]   (*CSW will update this form in Epic as items are completed)   11/18/2013  Patient/family provided with [facility] Department of Clinical Social Work's list of facilities offering this level of care within the geographic area requested by the patient (or if unable, by the patient's family).  11/18/2013  Patient/family informed of their freedom to choose among providers that offer the needed level of care, that participate in Medicare, Medicaid or managed care program needed by the patient, have an available bed and are willing to accept the patient.  11/18/2013  Patient/family informed of [facility]' ownership interest in [facility], as well as of the fact that they are under no obligation to receive care at this facility.  PASARR submitted to EDS on 11/18/2013 PASARR number received from EDS on 11/18/2013  FL2 transmitted to all facilities in geographic area requested by pt/family on  11/18/2013 FL2 transmitted to all facilities within larger geographic area on 11/18/2013  Patient informed that his/her managed care company has contracts with or will negotiate with  certain facilities, including the following:     Patient/family informed of bed offers received:  11/18/2013 Patient chooses bed at [facility] Physician recommends and patient chooses bed at  [facility]  Patient to be transferred to  on   Patient to be transferred to facility by   The following physician request were entered in Epic:   Additional Comments:  Benay Pike, [place]

## 2013-11-18 NOTE — Clinical Social Work Psychosocial (Signed)
Clinical Social Work Department BRIEF PSYCHOSOCIAL ASSESSMENT 11/18/2013  Patient:  [name], [name]     Account Number:  [id number]     Admit date:  11/17/2013  Clinical Social Worker:  Norina Buzzard Intern  Date/Time:  11/18/2013 09:53 AM  Referred by:  Physician  Date Referred:  11/18/2013 Referred for  SNF Placement   Other Referral:   Interview type:  Family Other interview type:   Daughter - [name]    PSYCHOSOCIAL DATA Living Status:  ALONE Admitted from facility:   Level of care:   Primary support name:  [name] Primary support relationship to patient:  CHILD, ADULT Degree of support available:   Very supportive    CURRENT CONCERNS Current Concerns  Post-Acute Placement   Other Concerns:    SOCIAL WORK ASSESSMENT / PLAN Met with pt's daughter, [name]. Pt not oriented and has diagnosis of Alzheimer's. Per [name], pt was brought to the hospital due to a fall at home. Pt tripped over a rug. She has 24hr care. Pt's daughter, two sons, and caregiver Brandy Hale are her best support. Pt's daughter reports that pt was ambulatory before this incident. Per [name] she is able to walk on her own without any assistive devices and sometimes help her by holding her hand. [name] reports pt dances every day. Daughter explains she's worried that pt will not do well in rehab due to Alzheimer's diagnosis. Daughter emotional regarding surgery and rehab process. Provided support. Discussed SNF with daughter who feels this will most likely be necessary. Explained insurance process. Interested in [facility] and [facility]. Pt to have surgery today.SNF list left in room. Will follow up with bed offers when available.   Assessment/plan status:  Psychosocial Support/Ongoing Assessment of Needs Other assessment/ plan:   Information/referral to community resources:   SNF List    PATIENT'S/FAMILY'S RESPONSE TO PLAN OF CARE: Pt not oriented. Daughter, [name] agreeable to bed search at [facility] and [facility]. CSW will follow up with  bed offers when available.      Delfina Redwood BSW Intern

## 2013-11-19 LAB — CBC WITH DIFFERENTIAL/PLATELET
Basophils Absolute: 0 10*3/uL (ref 0.0–0.1)
Basophils Relative: 0 % (ref 0–1)
EOS ABS: 0.3 10*3/uL (ref 0.0–0.7)
EOS PCT: 4 % (ref 0–5)
HEMATOCRIT: 27.2 % — AB (ref 36.0–46.0)
HEMOGLOBIN: 8.7 g/dL — AB (ref 12.0–15.0)
LYMPHS ABS: 1.2 10*3/uL (ref 0.7–4.0)
LYMPHS PCT: 14 % (ref 12–46)
MCH: 28.6 pg (ref 26.0–34.0)
MCHC: 32 g/dL (ref 30.0–36.0)
MCV: 89.5 fL (ref 78.0–100.0)
MONOS PCT: 9 % (ref 3–12)
Monocytes Absolute: 0.8 10*3/uL (ref 0.1–1.0)
Neutro Abs: 6.2 10*3/uL (ref 1.7–7.7)
Neutrophils Relative %: 73 % (ref 43–77)
PLATELETS: 99 10*3/uL — AB (ref 150–400)
RBC: 3.04 MIL/uL — ABNORMAL LOW (ref 3.87–5.11)
RDW: 15.1 % (ref 11.5–15.5)
WBC: 8.5 10*3/uL (ref 4.0–10.5)

## 2013-11-19 LAB — BASIC METABOLIC PANEL
BUN: 35 mg/dL — AB (ref 6–23)
CALCIUM: 8.1 mg/dL — AB (ref 8.4–10.5)
CO2: 23 mEq/L (ref 19–32)
Chloride: 114 mEq/L — ABNORMAL HIGH (ref 96–112)
Creatinine, Ser: 2.06 mg/dL — ABNORMAL HIGH (ref 0.50–1.10)
GFR calc Af Amer: 24 mL/min — ABNORMAL LOW (ref >90)
GFR calc non Af Amer: 21 mL/min — ABNORMAL LOW (ref >90)
GLUCOSE: 143 mg/dL — AB (ref 70–99)
Potassium: 5 mEq/L (ref 3.7–5.3)
SODIUM: 147 meq/L (ref 137–147)

## 2013-11-19 MED ORDER — METOPROLOL TARTRATE 1 MG/ML IV SOLN
5.0000 mg | Freq: Once | INTRAVENOUS | Status: AC
  Administered 2013-11-19: 5 mg via INTRAVENOUS
  Filled 2013-11-19: qty 5

## 2013-11-19 NOTE — Progress Notes (Signed)
Subjective: 1 Day Post-Op Procedure(s) (LRB): ARTHROPLASTY BIPOLAR HIP (Left) Patient reports pain as moderate.    Objective: Vital signs in last 24 hours: Temp:  [97.9 F (36.6 C)-98.4 F (36.9 C)] 98.1 F (36.7 C) (03/21 0630) Pulse Rate:  [32-151] 118 (03/21 1101) Resp:  [11-29] 20 (03/21 0800) BP: (113-186)/(41-94) 154/84 mmHg (03/21 1101) SpO2:  [87 %-100 %] 96 % (03/21 0800) Weight:  [83.915 kg (185 lb)] 83.915 kg (185 lb) (03/20 1129)  Intake/Output from previous day: 03/20 0701 - 03/21 0700 In: 2350 [I.V.:2000; Blood:350] Out: 815 [Urine:550; Drains:65; Blood:200] Intake/Output this shift:     Recent Labs  11/17/13 1921 11/18/13 0550 11/19/13 0914  HGB 11.8* 10.1* 8.7*    Recent Labs  11/18/13 0550 11/19/13 0914  WBC 9.6 8.5  RBC 3.52* 3.04*  HCT 31.7* 27.2*  PLT 132* 99*    Recent Labs  11/18/13 0550 11/19/13 0914  NA 146 147  K 4.9 5.0  CL 112 114*  CO2 22 23  BUN 43* 35*  CREATININE 2.19* 2.06*  GLUCOSE 117* 143*  CALCIUM 8.6 8.1*    Recent Labs  11/17/13 1921  INR 0.95    Neurologically intact Neurovascular intact Sensation intact distally Intact pulses distally Dorsiflexion/Plantar flexion intact  Daughter reports that her mother was very confused after surgery yesterday.  Patient was given some sedative and has slept since around 7pm last night and is still sleeping.  Therapy has not been done.  Her mother has mittens on as she was trying to pull out IVs.    Pain is controlled now.  HGB is 8.7 secondary to blood loss from surgery and rehydration.  I will transfuse two units of blood slowly today.  Hopefully she will awaken to have therapy later today.  Assessment/Plan: 1 Day Post-Op Procedure(s) (LRB): ARTHROPLASTY BIPOLAR HIP (Left) Up with therapy if able.  Sricharan Lacomb 11/19/2013, 11:14 AM

## 2013-11-19 NOTE — Progress Notes (Signed)
Patient not in restraints at this time.[name] is asleep with family at the bedside.Vital signs stable.

## 2013-11-19 NOTE — Progress Notes (Signed)
Subjective: The patient had a restless night according to her daughter. Her pain was controlled with morphine. She had a fall at home and fractured the left femoral neck. She was taken to surgery yesterday by orthopedist and tolerated surgery well.  Objective: Vital signs in last 24 hours: Temp:  [97.9 F (36.6 C)-99.3 F (37.4 C)] 98.1 F (36.7 C) (03/21 0630) Pulse Rate:  [32-151] 84 (03/21 0630) Resp:  [11-29] 20 (03/21 0630) BP: (113-203)/(41-95) 128/74 mmHg (03/21 0630) SpO2:  [87 %-100 %] 96 % (03/21 0630) Weight:  [83.915 kg (185 lb)] 83.915 kg (185 lb) (03/20 1129) Weight change:  Last BM Date: 11/16/13  Intake/Output from previous day: 03/20 0701 - 03/21 0700 In: 2350 [I.V.:2000; Blood:350] Out: 815 [Urine:550; Drains:65; Blood:200] Intake/Output this shift:    Physical Exam: HEENT negative with exception of hematoma left for head  Neck supple no JVD or thyroid abnormalities heart regular rhythm no murmurs lungs clear to P&A.  Abdomen the palpable organs or masses  Patient is tender over the left leg the incisions   Recent Labs  11/17/13 1921 11/18/13 0550  WBC 6.4 9.6  HGB 11.8* 10.1*  HCT 37.0 31.7*  PLT 160 132*   BMET  Recent Labs  11/17/13 1921 11/18/13 0550  NA 144 146  K 5.7* 4.9  CL 108 112  CO2 23 22  GLUCOSE 105* 117*  BUN 47* 43*  CREATININE 2.36* 2.19*  CALCIUM 9.2 8.6    Studies/Results: Dg Chest 1 View  11/17/2013   CLINICAL DATA:  Severe left hip pain, fell today  EXAM: CHEST - 1 VIEW  COMPARISON:  06/13/2011  FINDINGS: Enlargement of cardiac silhouette.  Mediastinal contours and pulmonary vascularity normal.  Bronchitic changes without infiltrate, pleural effusion or pneumothorax.  No acute osseous findings.  Bones demineralized.  IMPRESSION: Enlargement of cardiac silhouette.  Minimal bronchitic changes without infiltrate.   Electronically Signed   By: Lavonia Dana M.D.   On: 11/17/2013 20:26   Dg Hip Complete Left  11/17/2013    CLINICAL DATA:  Left hip pain post fall today  EXAM: LEFT HIP - COMPLETE 2+ VIEW  COMPARISON:  None  FINDINGS: Osseous demineralization.  Displaced left femoral neck fracture, subcapital position.  No dislocation.  Mild narrowing of bilateral hip joints.  SI joints symmetric.  Degenerative disc disease changes lower lumbar spine.  No acute fracture, dislocation, or bone destruction.  IMPRESSION: Displaced subcapital fracture of left femoral neck.  Osseous demineralization.   Electronically Signed   By: Lavonia Dana M.D.   On: 11/17/2013 20:25   Ct Head Wo Contrast  11/17/2013   CLINICAL DATA:  Fall, left frontal hematoma, history hypertension, Alzheimer's  EXAM: CT HEAD WITHOUT CONTRAST  CT CERVICAL SPINE WITHOUT CONTRAST  TECHNIQUE: Multidetector CT imaging of the head and cervical spine was performed following the standard protocol without intravenous contrast. Multiplanar CT image reconstructions of the cervical spine were also generated.  COMPARISON:  CT head 08/15/2005  FINDINGS: CT HEAD FINDINGS  Generalized atrophy.  Normal ventricular morphology.  No midline shift or mass effect.  Question beam hardening artifacts at brainstem/pons.  No intracranial hemorrhage, mass lesion or evidence acute infarction.  No extra-axial fluid collections.  Scattered dural calcifications.  Hyperostosis frontalis interna.  Left frontoparietal scalp hematoma.  No acute osseous findings.  Paranasal sinuses clear.  CT CERVICAL SPINE FINDINGS  Visualized skullbase intact.  Osseous demineralization.  Lung apices clear.  Prevertebral soft tissues normal thickness.  Scattered facet degenerative changes.  Minimal disc space narrowing C5-C6 and C6-C7.  No acute fracture, subluxation or bone destruction.  IMPRESSION: Generalized atrophy.  Left frontoparietal scalp hematoma.  No acute intracranial abnormalities.  Mild degenerative disc and facet disease changes of the cervical spine.  No acute cervical spine abnormalities.    Electronically Signed   By: Lavonia Dana M.D.   On: 11/17/2013 20:53   Ct Cervical Spine Wo Contrast  11/17/2013   CLINICAL DATA:  Fall, left frontal hematoma, history hypertension, Alzheimer's  EXAM: CT HEAD WITHOUT CONTRAST  CT CERVICAL SPINE WITHOUT CONTRAST  TECHNIQUE: Multidetector CT imaging of the head and cervical spine was performed following the standard protocol without intravenous contrast. Multiplanar CT image reconstructions of the cervical spine were also generated.  COMPARISON:  CT head 08/15/2005  FINDINGS: CT HEAD FINDINGS  Generalized atrophy.  Normal ventricular morphology.  No midline shift or mass effect.  Question beam hardening artifacts at brainstem/pons.  No intracranial hemorrhage, mass lesion or evidence acute infarction.  No extra-axial fluid collections.  Scattered dural calcifications.  Hyperostosis frontalis interna.  Left frontoparietal scalp hematoma.  No acute osseous findings.  Paranasal sinuses clear.  CT CERVICAL SPINE FINDINGS  Visualized skullbase intact.  Osseous demineralization.  Lung apices clear.  Prevertebral soft tissues normal thickness.  Scattered facet degenerative changes.  Minimal disc space narrowing C5-C6 and C6-C7.  No acute fracture, subluxation or bone destruction.  IMPRESSION: Generalized atrophy.  Left frontoparietal scalp hematoma.  No acute intracranial abnormalities.  Mild degenerative disc and facet disease changes of the cervical spine.  No acute cervical spine abnormalities.   Electronically Signed   By: Lavonia Dana M.D.   On: 11/17/2013 20:53   Dg Hip Portable 1 View Left  11/18/2013   CLINICAL DATA:  Fracture Left Hip  EXAM: PORTABLE LEFT HIP - 1 VIEW  COMPARISON:  DG HIP COMPLETE*L* dated 11/17/2013  FINDINGS: Patient is status post left hip arthroplasty. Hardware appears intact without evidence of loosening or failure. Native osseous structures are grossly unremarkable.  IMPRESSION: Patient is status post total left hip arthroplasty.    Electronically Signed   By: Margaree Mackintosh M.D.   On: 11/18/2013 14:12    Medications:  . albuterol  2.5 mg Nebulization Q6H  . donepezil  10 mg Oral QHS  . enoxaparin (LOVENOX) injection  40 mg Subcutaneous Q24H  . ezetimibe  10 mg Oral QHS   And  . simvastatin  40 mg Oral QHS  . LORazepam  1 mg Intravenous Q4H  . metoprolol tartrate  25 mg Oral BID    . sodium chloride 75 mL/hr at 11/19/13 0242     Assessment/Plan: 1. Left femoral neck fracture plan to continue supportive measures continue IV fluids morphine if needed. She will need rehabilitation   LOS: 2 days   Alexas Basulto G 11/19/2013, 7:01 AM

## 2013-11-19 NOTE — Progress Notes (Signed)
Patient has on safety hand mitt bilaterally, no restraints used.

## 2013-11-19 NOTE — Progress Notes (Signed)
Utilization review Completed Degan Hanser RN BSN   

## 2013-11-19 NOTE — Progress Notes (Signed)
Patient continues with bilateral hand mitts, no wrist restraints used.

## 2013-11-19 NOTE — Progress Notes (Addendum)
Patient continues with bilateral hand mitts, no restraints used.

## 2013-11-19 NOTE — Progress Notes (Signed)
Patient sedated this am,I spoke with Dr Legrand Rams regarding patient's metoprolol tartrate that's given by mouth,patient unable to take by mouth this am,order received to give medication times one IV,have pharmacy to adjust the transition from po to IV for dose.Thank you.

## 2013-11-20 LAB — CBC WITH DIFFERENTIAL/PLATELET
BASOS PCT: 0 % (ref 0–1)
Basophils Absolute: 0 10*3/uL (ref 0.0–0.1)
EOS ABS: 0.3 10*3/uL (ref 0.0–0.7)
EOS PCT: 4 % (ref 0–5)
HCT: 32.1 % — ABNORMAL LOW (ref 36.0–46.0)
HEMOGLOBIN: 10.8 g/dL — AB (ref 12.0–15.0)
LYMPHS ABS: 1.2 10*3/uL (ref 0.7–4.0)
Lymphocytes Relative: 13 % (ref 12–46)
MCH: 29.3 pg (ref 26.0–34.0)
MCHC: 33.6 g/dL (ref 30.0–36.0)
MCV: 87.2 fL (ref 78.0–100.0)
MONOS PCT: 10 % (ref 3–12)
Monocytes Absolute: 0.9 10*3/uL (ref 0.1–1.0)
Neutro Abs: 6.6 10*3/uL (ref 1.7–7.7)
Neutrophils Relative %: 73 % (ref 43–77)
PLATELETS: 89 10*3/uL — AB (ref 150–400)
RBC: 3.68 MIL/uL — AB (ref 3.87–5.11)
RDW: 16.1 % — ABNORMAL HIGH (ref 11.5–15.5)
WBC: 9 10*3/uL (ref 4.0–10.5)

## 2013-11-20 LAB — TYPE AND SCREEN
ABO/RH(D): A POS
ANTIBODY SCREEN: NEGATIVE
UNIT DIVISION: 0
UNIT DIVISION: 0
Unit division: 0

## 2013-11-20 LAB — BASIC METABOLIC PANEL
BUN: 32 mg/dL — ABNORMAL HIGH (ref 6–23)
CALCIUM: 8.3 mg/dL — AB (ref 8.4–10.5)
CO2: 21 mEq/L (ref 19–32)
Chloride: 115 mEq/L — ABNORMAL HIGH (ref 96–112)
Creatinine, Ser: 1.99 mg/dL — ABNORMAL HIGH (ref 0.50–1.10)
GFR calc Af Amer: 25 mL/min — ABNORMAL LOW (ref >90)
GFR, EST NON AFRICAN AMERICAN: 21 mL/min — AB (ref >90)
GLUCOSE: 131 mg/dL — AB (ref 70–99)
Potassium: 4.5 mEq/L (ref 3.7–5.3)
Sodium: 147 mEq/L (ref 137–147)

## 2013-11-20 LAB — URINE CULTURE: Colony Count: >=100000

## 2013-11-20 NOTE — Progress Notes (Signed)
Subjective: Patient is more awake today. Her po intake remained poor.  Objective: Vital signs in last 24 hours: Temp:  [98.6 F (37 C)-100.1 F (37.8 C)] 99.2 F (37.3 C) (03/22 0431) Pulse Rate:  [83-127] 108 (03/22 0431) Resp:  [18-20] 20 (03/22 0431) BP: (112-178)/(68-84) 178/77 mmHg (03/22 0431) SpO2:  [77 %-100 %] 100 % (03/22 0431) Weight change:  Last BM Date: 11/17/13  Intake/Output from previous day: 03/21 0701 - 03/22 0700 In: 3206.7 [I.V.:2902.5; Blood:304.2] Out: 1300 [Urine:1300]  PHYSICAL EXAM General appearance: no distress and slowed mentation Resp: clear to auscultation bilaterally Cardio: S1, S2 normal GI: soft, non-tender; bowel sounds normal; no masses,  no organomegaly Extremities: left hip surgical site is dressed and clean  Lab Results:    @labtest @ [facility] No results found for this basename: PHART, PCO2, PO2ART, TCO2, HCO3,  in the last 72 hours CULTURES Recent Results (from the past 240 hour(s))  URINE CULTURE     Status: None   Collection Time    11/17/13  8:42 PM      Result Value Ref Range Status   Specimen Description URINE, CATHETERIZED   Final   Special Requests NONE   Final   Culture  Setup Time     Final   Value: 11/17/2013 21:20     Performed at [facility]     Final   Value: >=100,000 COLONIES/ML     Performed at [facility]   Culture     Final   Value: ESCHERICHIA COLI     Performed at [facility]   Report Status 11/20/2013 FINAL   Final   Organism ID, Bacteria ESCHERICHIA COLI   Final  SURGICAL PCR SCREEN     Status: None   Collection Time    11/18/13 10:10 AM      Result Value Ref Range Status   MRSA, PCR NEGATIVE  NEGATIVE Final   Staphylococcus aureus NEGATIVE  NEGATIVE Final   Comment:            The Xpert SA Assay (FDA     approved for NASAL specimens     in patients over 89 years of age),     is one component of     a comprehensive surveillance     program.  Test  performance has     been validated by [employer] for patients greater     than or equal to 62 year old.     It is not intended     to diagnose infection nor to     guide or monitor treatment.  WOUND CULTURE     Status: None   Collection Time    11/18/13  1:25 PM      Result Value Ref Range Status   Specimen Description HIP LEFT CAPSULE   Final   Special Requests ANCEF 2gm   Final   Gram Stain     Final   Value: NO WBC SEEN     NO SQUAMOUS EPITHELIAL CELLS SEEN     NO ORGANISMS SEEN     Performed at [facility]   Culture     Final   Value: NO GROWTH 1 DAY     Performed at [facility]   Report Status PENDING   Incomplete   Studies/Results: Dg Hip Portable 1 View Left  11/18/2013   CLINICAL DATA:  Fracture Left Hip  EXAM: PORTABLE LEFT HIP -  1 VIEW  COMPARISON:  DG HIP COMPLETE*L* dated 11/17/2013  FINDINGS: Patient is status post left hip arthroplasty. Hardware appears intact without evidence of loosening or failure. Native osseous structures are grossly unremarkable.  IMPRESSION: Patient is status post total left hip arthroplasty.   Electronically Signed   By: Margaree Mackintosh M.D.   On: 11/18/2013 14:12    Medications: I have reviewed the patient's current medications.  Assesment: Principal Problem:   Femur fracture Active Problems:   Hip fracture   Dementia   HTN (hypertension)   CKD (chronic kidney disease), stage IV    Plan: Medications reviewed Continue current treatment Assist with her ADL.    LOS: 3 days   Burel Kahre 11/20/2013, 9:41 AM

## 2013-11-20 NOTE — Progress Notes (Signed)
Subjective: 2 Days Post-Op Procedure(s) (LRB): ARTHROPLASTY BIPOLAR HIP (Left) Patient reports pain as mild.    Objective: Vital signs in last 24 hours: Temp:  [98.6 F (37 C)-100.1 F (37.8 C)] 99.2 F (37.3 C) (03/22 0431) Pulse Rate:  [83-127] 108 (03/22 0431) Resp:  [18-20] 20 (03/22 0431) BP: (112-178)/(68-84) 178/77 mmHg (03/22 0431) SpO2:  [77 %-100 %] 100 % (03/22 0431)  Intake/Output from previous day: 03/21 0701 - 03/22 0700 In: 3206.7 [I.V.:2902.5; Blood:304.2] Out: 1300 [Urine:1300] Intake/Output this shift:     Recent Labs  11/17/13 1921 11/18/13 0550 11/19/13 0914 11/20/13 0619  HGB 11.8* 10.1* 8.7* 10.8*    Recent Labs  11/19/13 0914 11/20/13 0619  WBC 8.5 9.0  RBC 3.04* 3.68*  HCT 27.2* 32.1*  PLT 99* 89*    Recent Labs  11/19/13 0914 11/20/13 0619  NA 147 147  K 5.0 4.5  CL 114* 115*  CO2 23 21  BUN 35* 32*  CREATININE 2.06* 1.99*  GLUCOSE 143* 131*  CALCIUM 8.1* 8.3*    Recent Labs  11/17/13 1921  INR 0.95    Neurovascular intact Sensation intact distally Intact pulses distally Dorsiflexion/Plantar flexion intact Incision: scant drainage  She is confused, usual state.  Hemovac removed.  Foley removed.  She has not been out of bed secondary to lethargy.  She is more awake today. Will try to get out of bed.  Labs improved, HGB 10.9 after transfusions.  Wound OK.  Assessment/Plan: 2 Days Post-Op Procedure(s) (LRB): ARTHROPLASTY BIPOLAR HIP (Left) Up with therapy  Schon Zeiders 11/20/2013, 10:08 AM

## 2013-11-20 NOTE — Addendum Note (Signed)
Addendum created 11/20/13 0352 by Vista Deck, CRNA   Modules edited: Notes Section   Notes Section:  File: [id number]

## 2013-11-20 NOTE — Anesthesia Postprocedure Evaluation (Signed)
  Anesthesia Post-op Note  Patient: [name]  Procedure(s) Performed: Procedure(s): ARTHROPLASTY BIPOLAR HIP (Left)  Patient Location: Nursing Unit  Anesthesia Type:Spinal  Level of Consciousness: sleeping chart reviewed Patient remains sleepy,but restless from sedation medications. Received 2 units RBC's post operatively in addition to the one received in PACU.  Airway and Oxygen Therapy: Patient Spontanous Breathing and Patient connected to nasal cannula oxygen  Post-op Pain: none  Post-op Assessment: Post-op Vital signs reviewed, Patient's Cardiovascular Status Stable, Respiratory Function Stable, Patent Airway, No signs of Nausea or vomiting and Pain level controlled  Post-op Vital Signs: Reviewed and stable  Complications: No apparent anesthesia complications

## 2013-11-21 ENCOUNTER — Encounter (HOSPITAL_COMMUNITY): Payer: Self-pay | Admitting: Orthopaedic Surgery

## 2013-11-21 LAB — CBC WITH DIFFERENTIAL/PLATELET
BASOS ABS: 0 10*3/uL (ref 0.0–0.1)
Basophils Relative: 0 % (ref 0–1)
EOS ABS: 0.3 10*3/uL (ref 0.0–0.7)
EOS PCT: 4 % (ref 0–5)
HCT: 32.9 % — ABNORMAL LOW (ref 36.0–46.0)
Hemoglobin: 10.8 g/dL — ABNORMAL LOW (ref 12.0–15.0)
LYMPHS ABS: 1.3 10*3/uL (ref 0.7–4.0)
Lymphocytes Relative: 16 % (ref 12–46)
MCH: 29 pg (ref 26.0–34.0)
MCHC: 32.8 g/dL (ref 30.0–36.0)
MCV: 88.4 fL (ref 78.0–100.0)
Monocytes Absolute: 0.7 10*3/uL (ref 0.1–1.0)
Monocytes Relative: 8 % (ref 3–12)
Neutro Abs: 6.2 10*3/uL (ref 1.7–7.7)
Neutrophils Relative %: 73 % (ref 43–77)
PLATELETS: 88 10*3/uL — AB (ref 150–400)
RBC: 3.72 MIL/uL — ABNORMAL LOW (ref 3.87–5.11)
RDW: 16.2 % — AB (ref 11.5–15.5)
WBC: 8.4 10*3/uL (ref 4.0–10.5)

## 2013-11-21 LAB — BASIC METABOLIC PANEL
BUN: 29 mg/dL — ABNORMAL HIGH (ref 6–23)
CALCIUM: 8.7 mg/dL (ref 8.4–10.5)
CO2: 22 mEq/L (ref 19–32)
CREATININE: 1.75 mg/dL — AB (ref 0.50–1.10)
Chloride: 114 mEq/L — ABNORMAL HIGH (ref 96–112)
GFR calc Af Amer: 29 mL/min — ABNORMAL LOW (ref >90)
GFR calc non Af Amer: 25 mL/min — ABNORMAL LOW (ref >90)
GLUCOSE: 123 mg/dL — AB (ref 70–99)
Potassium: 4.7 mEq/L (ref 3.7–5.3)
Sodium: 146 mEq/L (ref 137–147)

## 2013-11-21 LAB — WOUND CULTURE
Culture: NO GROWTH
GRAM STAIN: NONE SEEN

## 2013-11-21 MED ORDER — HYDROCODONE-ACETAMINOPHEN 5-325 MG PO TABS: 1.0000 | ORAL_TABLET | Freq: Four times a day (QID) | ORAL | Status: DC | PRN

## 2013-11-21 MED ORDER — MORPHINE SULFATE 2 MG/ML IJ SOLN
1.0000 mg | INTRAMUSCULAR | Status: DC | PRN
  Administered 2013-11-21 – 2013-11-22 (×4): 1 mg via INTRAVENOUS
  Filled 2013-11-21 (×4): qty 1

## 2013-11-21 MED ORDER — ENOXAPARIN SODIUM 30 MG/0.3ML ~~LOC~~ SOLN
30.0000 mg | SUBCUTANEOUS | Status: DC
  Administered 2013-11-22: 30 mg via SUBCUTANEOUS
  Filled 2013-11-21: qty 0.3

## 2013-11-21 MED ORDER — CIPROFLOXACIN HCL 250 MG PO TABS
250.0000 mg | ORAL_TABLET | Freq: Two times a day (BID) | ORAL | Status: DC
  Administered 2013-11-21 – 2013-11-22 (×3): 250 mg via ORAL
  Filled 2013-11-21 (×3): qty 1

## 2013-11-21 MED ORDER — LORAZEPAM 2 MG/ML IJ SOLN
1.0000 mg | INTRAMUSCULAR | Status: DC
  Administered 2013-11-21 – 2013-11-22 (×3): 1 mg via INTRAVENOUS
  Filled 2013-11-21 (×3): qty 1

## 2013-11-21 MED ORDER — DOCUSATE SODIUM 100 MG PO CAPS
100.0000 mg | ORAL_CAPSULE | Freq: Two times a day (BID) | ORAL | Status: DC
  Administered 2013-11-21 – 2013-11-22 (×3): 100 mg via ORAL
  Filled 2013-11-21 (×3): qty 1

## 2013-11-21 NOTE — Progress Notes (Signed)
Subjective: 3 Days Post-Op Procedure(s) (LRB): ARTHROPLASTY BIPOLAR HIP (Left) Patient reports pain as mild.    Objective: Vital signs in last 24 hours: Temp:  [97.7 F (36.5 C)-100.4 F (38 C)] 97.7 F (36.5 C) (03/23 0605) Pulse Rate:  [93-104] 93 (03/23 0605) Resp:  [17-100] 17 (03/23 0605) BP: (137-155)/(78-89) 155/89 mmHg (03/23 0605) SpO2:  [18 %-100 %] 100 % (03/23 0605)  Intake/Output from previous day: 03/22 0701 - 03/23 0700 In: 2892.5 [I.V.:2892.5] Out: 500 [Urine:500] Intake/Output this shift:     Recent Labs  11/19/13 0914 11/20/13 0619 11/21/13 0643  HGB 8.7* 10.8* 10.8*    Recent Labs  11/20/13 0619 11/21/13 0643  WBC 9.0 8.4  RBC 3.68* 3.72*  HCT 32.1* 32.9*  PLT 89* 88*    Recent Labs  11/20/13 0619 11/21/13 0643  NA 147 146  K 4.5 4.7  CL 115* 114*  CO2 21 22  BUN 32* 29*  CREATININE 1.99* 1.75*  GLUCOSE 131* 123*  CALCIUM 8.3* 8.7   No results found for this basename: LABPT, INR,  in the last 72 hours  Neurovascular intact Sensation intact distally Intact pulses distally Dorsiflexion/Plantar flexion intact Incision: scant drainage  She remains lethargic at times.  I have stopped the q4h Ativan.  I have changed her pain medicine.  I have asked for physical therapy to see her as they have not seen her yet.  She got out of bed four hours yesterday.  Labs look good.  She does not like to have anyone touch her.  She remains confused but not as lethargic as she has been.  She should be able to go to SNF tomorrow perhaps depending on how she does with PT today and early tomorrow.  She will need to continue her enoxaparin for one month.  She will need PT, weight bear as tolerated, left hip.  She will need to continue abduction pillow in bed.  She will need to have sutures removed March 31st, steristrip wound.  I will need to see her in my office in one month post discharge.  X-rays of the left hip prior to my  appointment.  Assessment/Plan: 3 Days Post-Op Procedure(s) (LRB): ARTHROPLASTY BIPOLAR HIP (Left) Up with therapy  [name] 11/21/2013, 7:27 AM

## 2013-11-21 NOTE — Evaluation (Signed)
Physical Therapy Evaluation Patient Details Name: [name] MRN: [id number] DOB: [birth year] Today's Date: 11/21/2013 Time: 0850-1001 PT Time Calculation (min): 71 min  PT Assessment / Plan / Recommendation History of Present Illness  Pt had a fall at home and sustained a hip fx.  She had a hip replacement of the femoral component done on 11-18-13.  Pt has a hx of advanced dementia.  She lived alone with 24 hour assist of CG and ambulated with HHA.  Clinical Impression  Pt was seen for evaluation/tx.  She was crying at the time of my arrival and she was at the time frame of needing pain med.  RN was alerted and pt was medicated for pain.  I discussed the PT protocol with pt's daughter and explained that pt progress may be slower because of dementia.  Two person assist was needed to transfer pt supine to sit and once there, pt tolerated this well.  She refused to try to stand or pivot to a chair.  We ultimately used a Maxi Move lift which pt tolerated very well.  She was left up in a recliner, pt appearing relaxed and comfortable.  An ice pack was applied to the left hip.  Nursing service will use the lift to assist pt back to bed after lunch.    PT Assessment  Patient needs continued PT services    Follow Up Recommendations  SNF               Equipment Recommendations  None recommended by PT         Frequency Min 6X/week    Precautions / Restrictions Precautions Precautions: Fall;Anterior Hip Precaution Booklet Issued: No Precaution Comments: pt with advanced dementia Restrictions Weight Bearing Restrictions: No Other Position/Activity Restrictions: WBAT left     Mobility  Bed Mobility Overal bed mobility: +2 for physical assistance General bed mobility comments: 2 person assist to bring pt to sitting at EOB...she was able to sit independently but refused to try to pivot bed to chair Transfers Overall transfer level: Needs assistance General transfer comment: pt  transferred bed to chair with Maxi Move lift...this was tolerated well         PT Diagnosis: Difficulty walking;Acute pain;Generalized weakness  PT Problem List: Decreased strength;Decreased range of motion;Decreased activity tolerance;Decreased mobility;Decreased cognition;Decreased knowledge of use of DME;Decreased safety awareness;Decreased knowledge of precautions;Pain PT Treatment Interventions: Functional mobility training;Therapeutic activities     PT Goals(Current goals can be found in the care plan section) Acute Rehab PT Goals Patient Stated Goal: daughter hopes that pt will be able to walk again PT Goal Formulation: With family Time For Goal Achievement: 12/05/13 Potential to Achieve Goals: Fair  Visit Information  Last PT Received On: 11/21/13 History of Present Illness: Pt had a fall at home and sustained a hip fx.  She had a hip replacement of the femoral component done on 11-18-13.  Pt has a hx of advanced dementia.  She lived alone with 24 hour assist of CG and ambulated with HHA.       Prior [place] expects to be discharged to:: Skilled nursing facility Prior Function Level of Independence: Needs assistance Gait / Transfers Assistance Needed: HHA with transfers and gait ADL's / Homemaking Assistance Needed: assist with bathing and dressing, able to self feed Communication Communication: No difficulties    Cognition  Cognition Arousal/Alertness: Awake/alert Behavior During Therapy: Anxious Overall Cognitive Status: History of cognitive impairments - at baseline  Extremity/Trunk Assessment Lower Extremity Assessment Lower Extremity Assessment: Difficult to assess due to impaired cognition   Balance Balance Overall balance assessment: Modified Independent  End of Session PT - End of Session Equipment Utilized During Treatment: Gait belt;Oxygen Activity Tolerance: Treatment limited secondary to agitation Patient left: in  chair;with call bell/phone within reach;with family/visitor present Nurse Communication: Mobility status;Need for lift equipment  GP     [name] 11/21/2013, 10:08 AM

## 2013-11-21 NOTE — Plan of Care (Signed)
Problem: Phase I Progression Outcomes Goal: OOB as tolerated unless otherwise ordered OOB this morning with Maximove per PT.  Problem: Phase II Progression Outcomes Goal: Return of bowel function (flatus, BM) IF ABDOMINAL SURGERY:  Outcome: Progressing Positive Flatus

## 2013-11-21 NOTE — Clinical Social Work Note (Signed)
CSW spoke with pt's daughter on phone as she had just left to go get some rest. [name] remains concerned about BCBS secondary coverage. She reports she has left voicemail for Kerri at [facility] regarding this. [name] also asked about private room and will discuss with Marianna Fuss as well when she receives return call. Awaiting stability for d/c. Updated PNC on pt.   Benay Pike, [place]

## 2013-11-21 NOTE — Progress Notes (Signed)
Subjective: The patient is 4 days postop. She had surgery for fractured left femoral neck. And remains in satisfactory condition. She was up some in the chair yesterday but still has quite a bit of pain.  Objective: Vital signs in last 24 hours: Temp:  [97.7 F (36.5 C)-100.4 F (38 C)] 97.7 F (36.5 C) (03/23 0605) Pulse Rate:  [93-104] 93 (03/23 0605) Resp:  [17-100] 17 (03/23 0605) BP: (137-155)/(78-89) 155/89 mmHg (03/23 0605) SpO2:  [18 %-100 %] 100 % (03/23 0605) Weight change:  Last BM Date: 11/17/13  Intake/Output from previous day: 03/22 0701 - 03/23 0700 In: 2892.5 [I.V.:2892.5] Out: 500 [Urine:500] Intake/Output this shift: Total I/O In: 902.5 [I.V.:902.5] Out: -   Physical Exam: Lungs clear to P&A  Heart regular rhythm no murmurs  Abdomen no palpable organs or masses  Surgical site remains dressed and clean she has UTI Cipro will be started by mouth  Recent Labs  11/19/13 0914 11/20/13 0619  WBC 8.5 9.0  HGB 8.7* 10.8*  HCT 27.2* 32.1*  PLT 99* 89*   BMET  Recent Labs  11/19/13 0914 11/20/13 0619  NA 147 147  K 5.0 4.5  CL 114* 115*  CO2 23 21  GLUCOSE 143* 131*  BUN 35* 32*  CREATININE 2.06* 1.99*  CALCIUM 8.1* 8.3*    Studies/Results: No results found.  Medications:  . albuterol  2.5 mg Nebulization Q6H  . ciprofloxacin  250 mg Oral BID  . docusate sodium  100 mg Oral BID  . donepezil  10 mg Oral QHS  . enoxaparin (LOVENOX) injection  40 mg Subcutaneous Q24H  . ezetimibe  10 mg Oral QHS   And  . simvastatin  40 mg Oral QHS  . LORazepam  1 mg Intravenous Q4H  . metoprolol tartrate  25 mg Oral BID    . sodium chloride 75 mL/hr at 11/20/13 1826     Assessment/Plan:    LOS: 4 days   Devin Foskey G 11/21/2013, 6:29 AM

## 2013-11-21 NOTE — Clinical Documentation Improvement (Signed)
11/21/13  Please clarify "blood loss". Thank you.  Possible Clinical Conditions?    Expected Acute Blood Loss Anemia  Acute Blood Loss Anemia  Acute on chronic blood loss anemia  Chronic blood loss anemia  Precipitous drop in Hematocrit  Other Condition________________  Cannot Clinically Determine   Supporting Information: Risk Factors: (recent surgery, pre op anemia, EBL in OR) S/P Bipolar hip replacement on the left hip EBL 300 Large hemovac drain  Diagnostics: H&H 3/19 = 11.8/37.0 3/20 = 10.1/31.7 3/21 = 8.7/27.2 3/22 = 10.8/32.1 3/23 = 10.8/32.9   Treatments: Transfusion: 1 unit PRBC in PACU, then 2 units PRBC  IV fluids: NS@75ml /h H&H monitoring  Thank You, Estella Husk ,RN Clinical Documentation Specialist:  [facility] [employer]

## 2013-11-21 NOTE — Plan of Care (Signed)
Problem: Phase II Progression Outcomes Goal: Discharge plan established Outcome: Completed/Met Date Met:  11/21/13 D/C to SNF for Rehab

## 2013-11-22 ENCOUNTER — Telehealth: Payer: Self-pay | Admitting: Orthopedic Surgery

## 2013-11-22 ENCOUNTER — Inpatient Hospital Stay
Admission: RE | Admit: 2013-11-22 | Discharge: 2016-08-03 | Disposition: A | Discharge status: Other Acute Inpatient Hospital | Payer: BLUE CROSS/BLUE SHIELD | Source: Ambulatory Visit | Attending: Family Medicine | Admitting: Family Medicine

## 2013-11-22 DIAGNOSIS — W19XXXA Unspecified fall, initial encounter: Secondary | ICD-10-CM

## 2013-11-22 DIAGNOSIS — R509 Fever, unspecified: Secondary | ICD-10-CM

## 2013-11-22 DIAGNOSIS — R05 Cough: Secondary | ICD-10-CM

## 2013-11-22 DIAGNOSIS — S72012D Unspecified intracapsular fracture of left femur, subsequent encounter for closed fracture with routine healing: Secondary | ICD-10-CM

## 2013-11-22 DIAGNOSIS — R0989 Other specified symptoms and signs involving the circulatory and respiratory systems: Principal | ICD-10-CM

## 2013-11-22 DIAGNOSIS — R059 Cough, unspecified: Secondary | ICD-10-CM

## 2013-11-22 DIAGNOSIS — T148XXA Other injury of unspecified body region, initial encounter: Secondary | ICD-10-CM

## 2013-11-22 MED ORDER — HYDROCODONE-ACETAMINOPHEN 5-325 MG PO TABS: 1.0000 | ORAL_TABLET | Freq: Four times a day (QID) | ORAL | Status: DC | PRN

## 2013-11-22 MED ORDER — CIPROFLOXACIN HCL 250 MG PO TABS: 250.0000 mg | ORAL_TABLET | Freq: Two times a day (BID) | ORAL | Status: DC

## 2013-11-22 MED ORDER — HYDRALAZINE HCL 25 MG PO TABS: 25.0000 mg | ORAL_TABLET | Freq: Three times a day (TID) | ORAL | Status: DC

## 2013-11-22 NOTE — Care Management Note (Signed)
    Page 1 of 1   11/22/2013     1:34:18 PM   CARE MANAGEMENT NOTE 11/22/2013  Patient:  [name], [name]   Account Number:  [id number]  Date Initiated:  11/22/2013  Documentation initiated by:  Claretha Cooper  Subjective/Objective Assessment:   Pt [place] to [facility] today     Action/Plan:   Anticipated DC Date:  11/22/2013   Anticipated DC Plan:  SKILLED NURSING FACILITY  In-house referral  Clinical Social Worker      DC Planning Services  CM consult      Choice offered to / List presented to:             Status of service:  Completed, signed off Medicare Important Message given?  YES (If response is "NO", the following Medicare IM given date fields will be blank) Date Medicare IM given:  11/22/2013 Date Additional Medicare IM given:    Discharge Disposition:  [place]  Per UR Regulation:    If discussed at Long Length of Stay Meetings, dates discussed:    Comments:  11/22/13 Claretha Cooper RN BSN CM

## 2013-11-22 NOTE — Discharge Summary (Signed)
Physician Discharge Summary  [name] [id number] DOB: [birth year] DOA: 11/17/76  PCP: Lanette Hampshire, MD  Admit date: 11/17/2013 Discharge date: 11/22/76     Discharge Diagnoses:  1. Subcapital fracture of left femoral neck displaced 2. Anemia secondary to blood loss 3. Dementia 4. Hypertension essential 5 chronic kidney disease stage IV 6. Urinary tract infection   .     Discharge Condition:Disposition: The patient will be placed in nursing facility   Diet recommendation: Low-sodium heart healthy diet renal diet  Filed Weights   11/18/13 0823 11/18/13 1129  Weight: 83.915 kg (185 lb) 83.915 kg (185 lb)    History of present illness:  The patient apparently slipped on the right at home fell hitting her head but with no loss of consciousness. She was found to have by x-ray a displaced subcapital fracture of left femoral neck. She was very anxious complaining of hip pain on admission. She was admitted to the [place] floor.  Hospital Course:  After stabilization and IV fluids and the patient was taken to surgery by orthopedist under spinal anesthesia she had arthroplasty bipolar of left hip. This was done successfully. Postoperatively she did have considerable pain and required blood transfusion because of anemia her hemoglobin was 8.7 but returned at 10.8 after 4 days it was felt she could be transferred to nursing facility for rehabilitation.   Discharge Instructions The patient was instructed to continue the following medications. She will start rehabilitation and nursing facility and continue on these medications. She does have urinary tract infection due to Escherichia coli and will remain on ciprofloxacin 250 mg daily. Urinalysis will be checked again in one week.    Medication List         aspirin 325 MG tablet  Take 325 mg by mouth daily.     ciprofloxacin 250 MG tablet  Commonly known as:  CIPRO  Take 1 tablet (250 mg total) by mouth 2 (two) times  daily.     donepezil 10 MG tablet  Commonly known as:  ARICEPT  Take 10 mg by mouth at bedtime as needed.     ezetimibe-simvastatin 10-20 MG per tablet  Commonly known as:  VYTORIN  Take 1 tablet by mouth at bedtime.     hydrALAZINE 25 MG tablet  Commonly known as:  APRESOLINE  Take 1 tablet (25 mg total) by mouth 3 (three) times daily.     HYDROcodone-acetaminophen 5-325 MG per tablet  Commonly known as:  NORCO/VICODIN  Take 1 tablet by mouth every 6 (six) hours as needed for moderate pain.     metoprolol tartrate 25 MG tablet  Commonly known as:  LOPRESSOR  Take 25 mg by mouth 2 (two) times daily.       Allergies  Allergen Reactions  . Sulfa Antibiotics Rash    The results of significant diagnostics from this hospitalization (including imaging, microbiology, ancillary and laboratory) are listed below for reference.    Significant Diagnostic Studies: Dg Chest 1 View  11/17/2013   CLINICAL DATA:  Severe left hip pain, fell today  EXAM: CHEST - 1 VIEW  COMPARISON:  06/13/2011  FINDINGS: Enlargement of cardiac silhouette.  Mediastinal contours and pulmonary vascularity normal.  Bronchitic changes without infiltrate, pleural effusion or pneumothorax.  No acute osseous findings.  Bones demineralized.  IMPRESSION: Enlargement of cardiac silhouette.  Minimal bronchitic changes without infiltrate.   Electronically Signed   By: Lavonia Dana M.D.   On: 11/17/2013 20:26   Dg Hip Complete Left  11/17/2013   CLINICAL DATA:  Left hip pain post fall today  EXAM: LEFT HIP - COMPLETE 2+ VIEW  COMPARISON:  None  FINDINGS: Osseous demineralization.  Displaced left femoral neck fracture, subcapital position.  No dislocation.  Mild narrowing of bilateral hip joints.  SI joints symmetric.  Degenerative disc disease changes lower lumbar spine.  No acute fracture, dislocation, or bone destruction.  IMPRESSION: Displaced subcapital fracture of left femoral neck.  Osseous demineralization.   Electronically  Signed   By: Lavonia Dana M.D.   On: 11/17/2013 20:25   Ct Head Wo Contrast  11/17/2013   CLINICAL DATA:  Fall, left frontal hematoma, history hypertension, Alzheimer's  EXAM: CT HEAD WITHOUT CONTRAST  CT CERVICAL SPINE WITHOUT CONTRAST  TECHNIQUE: Multidetector CT imaging of the head and cervical spine was performed following the standard protocol without intravenous contrast. Multiplanar CT image reconstructions of the cervical spine were also generated.  COMPARISON:  CT head 08/15/2005  FINDINGS: CT HEAD FINDINGS  Generalized atrophy.  Normal ventricular morphology.  No midline shift or mass effect.  Question beam hardening artifacts at brainstem/pons.  No intracranial hemorrhage, mass lesion or evidence acute infarction.  No extra-axial fluid collections.  Scattered dural calcifications.  Hyperostosis frontalis interna.  Left frontoparietal scalp hematoma.  No acute osseous findings.  Paranasal sinuses clear.  CT CERVICAL SPINE FINDINGS  Visualized skullbase intact.  Osseous demineralization.  Lung apices clear.  Prevertebral soft tissues normal thickness.  Scattered facet degenerative changes.  Minimal disc space narrowing C5-C6 and C6-C7.  No acute fracture, subluxation or bone destruction.  IMPRESSION: Generalized atrophy.  Left frontoparietal scalp hematoma.  No acute intracranial abnormalities.  Mild degenerative disc and facet disease changes of the cervical spine.  No acute cervical spine abnormalities.   Electronically Signed   By: Lavonia Dana M.D.   On: 11/17/2013 20:53   Ct Cervical Spine Wo Contrast  11/17/2013   CLINICAL DATA:  Fall, left frontal hematoma, history hypertension, Alzheimer's  EXAM: CT HEAD WITHOUT CONTRAST  CT CERVICAL SPINE WITHOUT CONTRAST  TECHNIQUE: Multidetector CT imaging of the head and cervical spine was performed following the standard protocol without intravenous contrast. Multiplanar CT image reconstructions of the cervical spine were also generated.  COMPARISON:  CT  head 08/15/2005  FINDINGS: CT HEAD FINDINGS  Generalized atrophy.  Normal ventricular morphology.  No midline shift or mass effect.  Question beam hardening artifacts at brainstem/pons.  No intracranial hemorrhage, mass lesion or evidence acute infarction.  No extra-axial fluid collections.  Scattered dural calcifications.  Hyperostosis frontalis interna.  Left frontoparietal scalp hematoma.  No acute osseous findings.  Paranasal sinuses clear.  CT CERVICAL SPINE FINDINGS  Visualized skullbase intact.  Osseous demineralization.  Lung apices clear.  Prevertebral soft tissues normal thickness.  Scattered facet degenerative changes.  Minimal disc space narrowing C5-C6 and C6-C7.  No acute fracture, subluxation or bone destruction.  IMPRESSION: Generalized atrophy.  Left frontoparietal scalp hematoma.  No acute intracranial abnormalities.  Mild degenerative disc and facet disease changes of the cervical spine.  No acute cervical spine abnormalities.   Electronically Signed   By: Lavonia Dana M.D.   On: 11/17/2013 20:53   Dg Hip Portable 1 View Left  11/18/2013   CLINICAL DATA:  Fracture Left Hip  EXAM: PORTABLE LEFT HIP - 1 VIEW  COMPARISON:  DG HIP COMPLETE*L* dated 11/17/2013  FINDINGS: Patient is status post left hip arthroplasty. Hardware appears intact without evidence of loosening or failure. Native osseous structures are grossly  unremarkable.  IMPRESSION: Patient is status post total left hip arthroplasty.   Electronically Signed   By: Margaree Mackintosh M.D.   On: 11/18/2013 14:12    Microbiology: Recent Results (from the past 240 hour(s))  URINE CULTURE     Status: None   Collection Time    11/17/13  8:42 PM      Result Value Ref Range Status   Specimen Description URINE, CATHETERIZED   Final   Special Requests NONE   Final   Culture  Setup Time     Final   Value: 11/17/2013 21:20     Performed at [place]     Final   Value: >=100,000 COLONIES/ML     Performed at [facility]   Culture     Final   Value: ESCHERICHIA COLI     Performed at [facility]   Report Status 11/20/2013 FINAL   Final   Organism ID, Bacteria ESCHERICHIA COLI   Final  SURGICAL PCR SCREEN     Status: None   Collection Time    11/18/13 10:10 AM      Result Value Ref Range Status   MRSA, PCR NEGATIVE  NEGATIVE Final   Staphylococcus aureus NEGATIVE  NEGATIVE Final   Comment:            The Xpert SA Assay (FDA     approved for NASAL specimens     in patients over 67 years of age),     is one component of     a comprehensive surveillance     program.  Test performance has     been validated by [employer] for patients greater     than or equal to 79 year old.     It is not intended     to diagnose infection nor to     guide or monitor treatment.  WOUND CULTURE     Status: None   Collection Time    11/18/13  1:25 PM      Result Value Ref Range Status   Specimen Description HIP LEFT CAPSULE   Final   Special Requests ANCEF 2gm   Final   Gram Stain     Final   Value: NO WBC SEEN     NO SQUAMOUS EPITHELIAL CELLS SEEN     NO ORGANISMS SEEN     Performed at [facility]   Culture     Final   Value: NO GROWTH 2 DAYS     Performed at [facility]   Report Status 11/21/2013 FINAL   Final     Labs: Basic Metabolic Panel:  Recent Labs Lab 11/17/13 1921 11/18/13 0550 11/19/13 0914 11/20/13 0619 11/21/13 0643  NA 144 146 147 147 146  K 5.7* 4.9 5.0 4.5 4.7  CL 108 112 114* 115* 114*  CO2 23 22 23 21 22   GLUCOSE 105* 117* 143* 131* 123*  BUN 47* 43* 35* 32* 29*  CREATININE 2.36* 2.19* 2.06* 1.99* 1.75*  CALCIUM 9.2 8.6 8.1* 8.3* 8.7   Liver Function Tests:  Recent Labs Lab 11/17/13 1921  AST 17  ALT 12  ALKPHOS 96  BILITOT 0.2*  PROT 7.7  ALBUMIN 3.9   No results found for this basename: LIPASE, AMYLASE,  in the last 168 hours No results found for this basename: AMMONIA,  in the last 168 hours CBC:  Recent Labs Lab  11/17/13  1921 11/18/13 0550 11/19/13 0914 11/20/13 0619 11/21/13 0643  WBC 6.4 9.6 8.5 9.0 8.4  NEUTROABS 3.6  --  6.2 6.6 6.2  HGB 11.8* 10.1* 8.7* 10.8* 10.8*  HCT 37.0 31.7* 27.2* 32.1* 32.9*  MCV 91.1 90.1 89.5 87.2 88.4  PLT 160 132* 99* 89* 88*   Cardiac Enzymes: No results found for this basename: CKTOTAL, CKMB, CKMBINDEX, TROPONINI,  in the last 168 hours BNP: BNP (last 3 results) No results found for this basename: PROBNP,  in the last 8760 hours CBG: No results found for this basename: GLUCAP,  in the last 168 hours  Principal Problem:   Femur fracture Active Problems:   Hip fracture   Dementia   HTN (hypertension)   CKD (chronic kidney disease), stage IV   Time coordinating discharge: 30 minutes  Signed:  Marjean Donna, MD 11/22/2013, 6:36 AM

## 2013-11-22 NOTE — Clinical Social Work Placement (Signed)
Clinical Social Work Department CLINICAL SOCIAL WORK PLACEMENT NOTE 11/22/2013  Patient:  [name], [name]  Account Number:  [id number] Admit date:  11/17/2013  Clinical Social Worker:  Raquel Sarna SZPYRKA, CLINICAL SOCIAL WORKER  Date/time:  11/18/2013 10:42 AM  Clinical Social Work is seeking post-discharge placement for this patient at the following level of care:   [place]   (*CSW will update this form in Epic as items are completed)   11/18/2013  Patient/family provided with [place] Department of Clinical Social Work's list of facilities offering this level of care within the geographic area requested by the patient (or if unable, by the patient's family).  11/18/2013  Patient/family informed of their freedom to choose among providers that offer the needed level of care, that participate in Medicare, Medicaid or managed care program needed by the patient, have an available bed and are willing to accept the patient.  11/18/2013  Patient/family informed of [facility]' ownership interest in [facility], as well as of the fact that they are under no obligation to receive care at this facility.  PASARR submitted to EDS on 11/18/2013 PASARR number received from EDS on 11/18/2013  FL2 transmitted to all facilities in geographic area requested by pt/family on  11/18/2013 FL2 transmitted to all facilities within larger geographic area on 11/18/2013  Patient informed that his/her managed care company has contracts with or will negotiate with  certain facilities, including the following:     Patient/family informed of bed offers received:  11/18/2013 Patient chooses bed at [facility] Physician recommends and patient chooses bed at  [facility]  Patient to be transferred to [facility] on  11/22/2013 Patient to be transferred to facility by RN  The following physician request were entered in Epic:   Additional Comments:  Benay Pike,  [place]

## 2013-11-22 NOTE — Clinical Social Work Note (Addendum)
Pt d/c today to [facility]. Pt's daughter and facility aware and agreeable. D/C summary faxed. Pt to transfer with RN. [facility] will get script from MD office.   [name], [place]

## 2013-11-22 NOTE — Progress Notes (Signed)
Subjective: 4 Days Post-Op Procedure(s) (LRB): ARTHROPLASTY BIPOLAR HIP (Left) Patient reports pain as mild.    Objective: Vital signs in last 24 hours: Temp:  [97.2 F (36.2 C)-100.5 F (38.1 C)] 98.9 F (37.2 C) (03/24 0023) Pulse Rate:  [85-127] 86 (03/24 0604) Resp:  [16-28] 16 (03/24 0400) BP: (147-190)/(56-120) 161/82 mmHg (03/24 0604) SpO2:  [95 %-100 %] 99 % (03/24 0604)  Intake/Output from previous day: 03/23 0701 - 03/24 0700 In: 1300 [P.O.:360; I.V.:940] Out: -  Intake/Output this shift:     Recent Labs  11/19/13 0914 11/20/13 0619 11/21/13 0643  HGB 8.7* 10.8* 10.8*    Recent Labs  11/20/13 0619 11/21/13 0643  WBC 9.0 8.4  RBC 3.68* 3.72*  HCT 32.1* 32.9*  PLT 89* 88*    Recent Labs  11/20/13 0619 11/21/13 0643  NA 147 146  K 4.5 4.7  CL 115* 114*  CO2 21 22  BUN 32* 29*  CREATININE 1.99* 1.75*  GLUCOSE 131* 123*  CALCIUM 8.3* 8.7   No results found for this basename: LABPT, INR,  in the last 72 hours  Neurovascular intact Sensation intact distally Intact pulses distally Dorsiflexion/Plantar flexion intact Incision: no drainage  She is alert this morning and talking.  She is disoriented (usual status).  She required maximum assistance to be out of bed with PT.  She will be discharged today.  I will see her in the office in one month.   Assessment/Plan: 4 Days Post-Op Procedure(s) (LRB): ARTHROPLASTY BIPOLAR HIP (Left) Discharge to SNF  [facility] 11/22/2013, 7:53 AM

## 2013-11-22 NOTE — Telephone Encounter (Addendum)
°   Per phone call from [facility], disregard the question about anti-cogulant.  She found Dr. Brooke Bonito discharge summary and the instructions were addresed in discharge summary. Santiago Glad pager [phone]

## 2013-11-22 NOTE — Progress Notes (Signed)
Patient being transferred to [facility].  Report called and given to [facility].  IVs removed.  Family present and aware of DC plans.  Patient dressed in transfer gown.  Family gathered patient belongings.  No questions at this time. Stable to DC.  Will transport in bed.  Dose of MOM also given this AM d/t patient not having BM recently - staff at [facility] made aware.

## 2013-12-20 ENCOUNTER — Ambulatory Visit (HOSPITAL_COMMUNITY): Payer: No Typology Code available for payment source | Attending: Internal Medicine

## 2013-12-20 DIAGNOSIS — R0989 Other specified symptoms and signs involving the circulatory and respiratory systems: Secondary | ICD-10-CM | POA: Diagnosis not present

## 2013-12-20 DIAGNOSIS — G309 Alzheimer's disease, unspecified: Secondary | ICD-10-CM | POA: Diagnosis not present

## 2013-12-20 DIAGNOSIS — I10 Essential (primary) hypertension: Secondary | ICD-10-CM | POA: Diagnosis not present

## 2013-12-20 DIAGNOSIS — R059 Cough, unspecified: Secondary | ICD-10-CM | POA: Insufficient documentation

## 2013-12-20 DIAGNOSIS — R05 Cough: Secondary | ICD-10-CM | POA: Diagnosis not present

## 2013-12-20 DIAGNOSIS — F0280 Dementia in other diseases classified elsewhere without behavioral disturbance: Secondary | ICD-10-CM | POA: Insufficient documentation

## 2013-12-22 ENCOUNTER — Ambulatory Visit (HOSPITAL_COMMUNITY): Payer: No Typology Code available for payment source | Attending: Orthopaedic Surgery

## 2013-12-22 DIAGNOSIS — X58XXXA Exposure to other specified factors, initial encounter: Secondary | ICD-10-CM | POA: Diagnosis not present

## 2013-12-22 DIAGNOSIS — S72009A Fracture of unspecified part of neck of unspecified femur, initial encounter for closed fracture: Secondary | ICD-10-CM | POA: Insufficient documentation

## 2014-02-01 ENCOUNTER — Ambulatory Visit (HOSPITAL_COMMUNITY): Payer: Medicare Other | Attending: Internal Medicine

## 2014-02-01 DIAGNOSIS — Z4789 Encounter for other orthopedic aftercare: Secondary | ICD-10-CM | POA: Insufficient documentation

## 2014-05-02 ENCOUNTER — Ambulatory Visit (HOSPITAL_COMMUNITY): Payer: Medicare Other | Attending: Orthopaedic Surgery

## 2014-05-02 DIAGNOSIS — Z9889 Other specified postprocedural states: Secondary | ICD-10-CM | POA: Insufficient documentation

## 2014-06-06 LAB — GLUCOSE, CAPILLARY: Glucose-Capillary: 164 mg/dL — ABNORMAL HIGH (ref 70–99)

## 2014-07-19 ENCOUNTER — Non-Acute Institutional Stay (SKILLED_NURSING_FACILITY): Payer: Medicare Other | Admitting: Internal Medicine

## 2014-07-19 DIAGNOSIS — N183 Chronic kidney disease, stage 3 (moderate): Secondary | ICD-10-CM

## 2014-07-19 DIAGNOSIS — F03918 Unspecified dementia, unspecified severity, with other behavioral disturbance: Secondary | ICD-10-CM

## 2014-07-19 DIAGNOSIS — I129 Hypertensive chronic kidney disease with stage 1 through stage 4 chronic kidney disease, or unspecified chronic kidney disease: Secondary | ICD-10-CM

## 2014-07-19 DIAGNOSIS — F0391 Unspecified dementia with behavioral disturbance: Secondary | ICD-10-CM

## 2014-07-19 DIAGNOSIS — N182 Chronic kidney disease, stage 2 (mild): Secondary | ICD-10-CM

## 2014-07-19 DIAGNOSIS — N189 Chronic kidney disease, unspecified: Secondary | ICD-10-CM

## 2014-07-19 DIAGNOSIS — N184 Chronic kidney disease, stage 4 (severe): Secondary | ICD-10-CM

## 2014-07-19 DIAGNOSIS — N181 Chronic kidney disease, stage 1: Secondary | ICD-10-CM

## 2014-07-19 DIAGNOSIS — N185 Chronic kidney disease, stage 5: Secondary | ICD-10-CM

## 2014-07-22 NOTE — Progress Notes (Addendum)
Patient ID: [name], female   DOB: 03-Mar-1924, 80 y.o.   MRN: [id number]               HISTORY & PHYSICAL  DATE:  07/19/2014    FACILITY: [place]    LEVEL OF CARE:   SNF   CHIEF COMPLAINT:  Transfer to our service.    HISTORY OF PRESENT ILLNESS:  [name] is an 80 year-old woman with severe dementia.  I believe she was admitted to the building after a stay at [facility] from 11/17/2013 through 11/22/2013.   She had a subcapital fracture of the left femoral neck.  She had a left hip arthroplasty.     PAST MEDICAL HISTORY/PROBLEM LIST:    Left hip fracture.  Status post left bipolar arthroplasty.    Anemia.    Severe dementia.    Hypertension.    Chronic renal failure stage IV.    History of a UTI.    CURRENT MEDICATIONS:  Medication list is reviewed.    Aricept 10 mg q.d.    Metoprolol 25 b.i.d.    Vytorin 10/20 q.h.s.    Norco 7.5/325 q.4 p.r.n.    Hydralazine 25 b.i.d.    Namenda XR 28 mg daily.    Milk of Magnesia 30 mL daily p.r.n.    ASA 81 q.d.    Colace 100 b.i.d.    Ativan 0.5  t.i.d. p.r.n. for anxiety.    Depakote Sprinkles 125, 2 capsules/250 mg twice a day at 12 p.m. and 6 p.m.     LABORATORY DATA:  Recent lab work on 07/05/2014 showed a white count of 6, hemoglobin 10.4, platelet count 137.    On 05/17/2014:  Sodium was 145, BUN 43, creatinine 2.85.    Liver function tests normal.    Her albumin was 2.9.    PHYSICAL EXAMINATION:   GENERAL APPEARANCE:  The patient is a woman with severe dementia.  She gets very restless with any attempt at examination.   HEENT:   MOUTH/THROAT:   She has few remaining teeth in the upper and lower jaws.   CHEST/RESPIRATORY:  Clear air entry bilaterally.   CARDIOVASCULAR:  CARDIAC:   Heart sounds are normal.  She does not appear to be overtly dehydrated.   GASTROINTESTINAL:  ABDOMEN:   Distended.  No masses.  No tenderness.     ASSESSMENT/PLAN:    Severe dementia.  She is on  Namenda.  I wonder about the effectiveness here.    Behavioral issues related to #1.  She is on Depakote.  Very difficult to examine this lady meaningfully.  I do not see a Depakote level; but without getting a sense of what the staff is witnessing, I do not think one is actually necessary.  She did have a slightly low platelet count, however, which could be attributable to Depakote.    Chronic renal failure, which is stage IV. Presumably this is related to hypertension.  Anemia of chronic disease.  I do not see a reason to monitor this so excessively.  The patient is a DNR with advanced dementia.    I am going to check a repeat BUN and creatinine.  I would like to check with the staff about behavioral issues that they are seeing before I address any changes to the Depakote.

## 2014-08-25 ENCOUNTER — Non-Acute Institutional Stay (SKILLED_NURSING_FACILITY): Payer: Medicare Other | Admitting: Internal Medicine

## 2014-08-25 ENCOUNTER — Encounter: Payer: Self-pay | Admitting: Internal Medicine

## 2014-08-25 DIAGNOSIS — N184 Chronic kidney disease, stage 4 (severe): Secondary | ICD-10-CM

## 2014-08-25 DIAGNOSIS — I10 Essential (primary) hypertension: Secondary | ICD-10-CM

## 2014-08-25 DIAGNOSIS — S72009D Fracture of unspecified part of neck of unspecified femur, subsequent encounter for closed fracture with routine healing: Secondary | ICD-10-CM

## 2014-08-25 DIAGNOSIS — F0391 Unspecified dementia with behavioral disturbance: Secondary | ICD-10-CM

## 2014-08-26 ENCOUNTER — Inpatient Hospital Stay (HOSPITAL_COMMUNITY): Payer: Medicare Other | Attending: Internal Medicine

## 2014-08-26 NOTE — Progress Notes (Signed)
Patient ID: [name], female   DOB: 05-10-1924, 80 y.o.   MRN: [id number]                 FACILITY: [facility]    LEVEL OF CARE:   SNF   CHIEF COMPLAINT:  Acute visit status post fall  --follow-up of chronic medical issues including severe dementia with behaviors-chronic renal insufficiency-hypertension  HISTORY OF PRESENT ILLNESS:  [name] is an 80 year-old woman with severe dementia.  I believe she was admitted to the building after a stay at [facility] for a subcapital fracture of the left femoral neck.  She had a left hip arthroplasty.--She has significant dementia and has a sitter during the day with her area  She continues on Aricept as well as Namenda--also on Depakote for behaviors.  She has been relatively stable however this evening apparently she was found lying on the floor apparently on her side-she was not complaining of any pain-she was assisted up by nursing staff and at this point appears to be at her baseline-     PAST MEDICAL HISTORY/PROBLEM LIST:    Left hip fracture.  Status post left bipolar arthroplasty.    Anemia.    Severe dementia.    Hypertension.    Chronic renal failure stage IV.    History of a UTI.    CURRENT MEDICATIONS:  Medication list is reviewed.    Aricept 10 mg q.d.    Metoprolol 25 b.i.d.    Vytorin 10/20 q.h.s.    Norco 7.5/325 q.4 p.r.n.    Hydralazine 25 b.i.d.    Namenda XR 28 mg daily.    Milk of Magnesia 30 mL daily p.r.n.    ASA 81 q.d.    Colace 100 b.i.d.    Ativan 0.5  t.i.d. p.r.n. for anxiety.    Depakote Sprinkles 125, 2 capsules/250 mg twice a day at 12 p.m. and 6 p.m.     LABORATORY DATA:  08/09/2014.  WBC 6.6 hemoglobin 10.5.  Sodium 143 potassium 4 BUN 41 creatinine 2.67.  Liver function tests within normal limits except albumin of 2.9.    Recent lab work on 07/05/2014 showed a white count of 6, hemoglobin 10.4, platelet count 137.    On 05/17/2014:  Sodium was 145, BUN  43, creatinine 2.85.    Liver function tests normal.    Her albumin was 2.9.     Review of systems-essentially unattainable from patient-however according to nursing staff she is not complaining of any pain-no shortness of breath cough-or headache-appears to be at her baseline-other than occasional agitation and wandering nursing staff has not really noted any issues  PHYSICAL EXAMINATION: Afebrile pulse 68 respirations 18 blood pressure 158/74   GENERAL APPEARANCE:  The patient is a woman with severe dementia. Appears to be at baseline she is somewhat agitated with exam which is not new.   HEENT:[place]  Pharynx is clear mucous membranes appear fairly moist Pulse appear reactive to light sclerae and conjunctivae are clear-somewhat difficult exam since  patient closing her eyes with any attempt at  examination MOUTH/THROAT:   She has few remaining teeth in the upper and lower jaws.   CHEST/RESPIRATORY:  Clear air entry bilaterally.   CARDIOVASCULAR:  CARDIAC:   Heart sounds are normal. .   GASTROINTESTINAL:  ABDOMEN:   Distended.  No masses.  No tenderness to bowel sounds.  Musculoskeletal-appears to be at baseline and there is no pain with flexion or extension at her hips  bilaterally-I do not note anything increased from baseline edema-moves all her extremities at baseline it appears with out any pain or discomfort-I did not note any deformities hematomas or bleeding.  She is able to stand without pain in fact when I initially evaluated hsaw she was in the bathroom with a nursing tech and was standing holding the railing--without any apparent discomfort.   Her logic is grossly intact cranial nerves intact speech is clear although she is quite confused which is not new    ASSESSMENT/PLAN:  Status post fall-gait abnormality-patient appears stable with a fairly benign physical exam-however per chart review appears she does have a previous history of a left hip fracture with repair-will update a  hip x-ray to ensure there has not been any changes---continue to monitor closely with facility fall protocol    Severe dementia.  She is on Namenda.and Aricept-at some point one would wonder about the effectiveness of these  in lieu of severe dementia  Behavioral issues related to #1.  She is on Depakote.  We'll try to obtain a Depakote level-also will check her platelets-according the most recent lab the was not a platelet count but said it appeared to be adequate-platelets were minimally reduced at 137 last month-again this would warrant update    .    Chronic renal failure, which is stage IV. Presumably this is related to hypertension.--Appears to be at baseline  Anemia of chronic disease.  I do not see a reason to monitor this so excessively.  The patient is a DNR with advanced dementia    Hypertension-she continues on hydralazine as well as metoprolol-I note systolic is somewhat elevated status post fall but I suspect this is due to theexcitement  after the fall-at this point will monitor blood pressures she is going to get vital signs per fall protocol would like to see what these do before making any [id number]  .     Marland Kitchen

## 2014-09-29 ENCOUNTER — Non-Acute Institutional Stay (SKILLED_NURSING_FACILITY): Payer: Medicare Other | Admitting: Internal Medicine

## 2014-09-29 DIAGNOSIS — F0391 Unspecified dementia with behavioral disturbance: Secondary | ICD-10-CM

## 2014-09-29 DIAGNOSIS — N184 Chronic kidney disease, stage 4 (severe): Secondary | ICD-10-CM

## 2014-09-29 DIAGNOSIS — R55 Syncope and collapse: Secondary | ICD-10-CM

## 2014-09-29 DIAGNOSIS — I10 Essential (primary) hypertension: Secondary | ICD-10-CM

## 2014-09-29 NOTE — Progress Notes (Signed)
Patient ID: MAHROSH POVEROMO, female   DOB: 1923-09-16, 81 y.o.   MRN: [id number]   ACILITY: [facility]    LEVEL OF CARE:   SNF  This is an acute visit   CHIEF COMPLAINT:  Acute visit secondary to unresponsive episode-syncope    HISTORY OF PRESENT ILLNESS:  Mrs. Liegel is an 81 year-old woman with severe dementia.  I believe she was admitted to the building after a stay at [facility] for a subcapital fracture of the left femoral neck.  She had a left hip arthroplasty.--She has significant dementia and has a sitter during the day with her   She continues on Aricept as well as Namenda--also on Depakote for behaviors.  She has been relatively stable however this afternoon while attempting a bowel movement apparently became unresponsive eyes rolled back in her head--and she became limp- I was called to the room and when I initially saw her she was unresponsive lying back on her wheelchair she was breathing and had a pulse-however shortly thereafter her eyes opened up and within a matter of minutes was essentially back at her baseline--quite agitated was attempted any invasive maneuver.  Her vital signs were stable-.  Per discussion with her sitter who was there as well as later with her daughter via phone-this is not unusual in her past although it's been a while since she's had a syncopal-type episode-her daughter states at times they have called EMS  But she has refused to go to the hospital because she was back at her baseline--.  Her daughter is comfortable just monitoring this and does not wish an aggressive workup ER evaluation or extensive invasive testing.  An EKG would be virtually impossible at this point with patient's agitation.  Patient is a poor historian but again appears to be at her baseline.  I did assess patient later in the evening and she continued to be at her baseline-- was resting comfortably in bed later in the evening and again became agitated with a temp and  exam which is baseline for her       PAST MEDICAL HISTORY/PROBLEM LIST:    Left hip fracture.  Status post left bipolar arthroplasty.    Anemia.    Severe dementia.    Hypertension.    Chronic renal failure stage IV.    History of a UTI.    CURRENT MEDICATIONS:  Medication list is reviewed.    Aricept 10 mg q.d.    Metoprolol 25 b.i.d.    Vytorin 10/20 q.h.s.    Norco 7.5/325 q.4 p.r.n.    Hydralazine 25 b.i.d.    Namenda XR 28 mg daily.    Milk of Magnesia 30 mL daily p.r.n.    ASA 81 q.d.    Colace 100 b.i.d.    Ativan 0.5  t.i.d. p.r.n. for anxiety.    Depakote Sprinkles 125, 2 capsules/250 mg twice a day at 12 p.m. and 6 p.m.     LABORATORY DATA  08/28/2014.  WBC 5.9 hemoglobin 10.4 platelets 138.  :  08/09/2014.  WBC 6.6 hemoglobin 10.5.  Sodium 143 potassium 4 BUN 41 creatinine 2.67.  Liver function tests within normal limits except albumin of 2.9.    Recent lab work on 07/05/2014 showed a white count of 6, hemoglobin 10.4, platelet count 137.    On 05/17/2014:  Sodium was 145, BUN 43, creatinine 2.85.    Liver function tests normal.    Her albumin was 2.9.     Review  of systems-essentially unattainable from patient-however according to nursing staff she is not complaining of any pain-no shortness of breath cough-or headache-appears to be back at her baseline   PHYSICAL EXAMINATION: She is afebrile pulse 78 respirations 20 blood pressure 135/61   GENERAL APPEARANCE:  The patient is a woman with severe dementia. Appears to be at baseline she is somewhat agitated with exam which is not new-- as noted on initial evaluation she was unresponsive lying back and wheelchair but quickly became responsive.   HEENT: Oropharynx  is clear mucous membranes appear fairly moist Pulse appear reactive to light sclerae and conjunctivae are clear-  MOUTH/THROAT:   She has few remaining teeth in the upper and lower jaws.   CHEST/RESPIRATORY:  Clear air  entry bilaterally.   CARDIOVASCULAR:  CARDIAC:   Heart sounds are normal. as was the case initially when I examined her in the bathroom and later in the evening as well --although later exam was somewhat limited secondary to patient agitation  .   GASTROINTESTINAL:  ABDOMEN:   Distended.  No masses.  No tenderness to bowel sounds.  Musculoskeletal-appears to be at baseline--initially she was somewhat limp  but quickly began moving and agitated with baseline strength in all extremities--which with her agitation actually made subsequent exam difficult .  Marland Kitchen   Neuro--c is grossly intact cranial nerves intact speech is clear although she is quite confused which is not new    ASSESSMENT/PLAN:   Suspected syncopal episode-as noted above patient remains at her baseline now--this  was quite transitory-I did discuss this with her daughter via phone does not want an aggressive workup-we did order labs which have returned unremarkable sodium 139 potassium 4 BUN 43 creatinine 2.78 which is baseline albumin 3.2 otherwise liver function tests within normal limits-a CBC Depakote level is still pending but I suspect these will be unremarkable     Severe dementia.  She is on Namenda.and Aricept-at some point one would wonder about the effectiveness of these  in lieu of severe dementia  Behavioral issues related to #1.  She is on Depakote.      .    Chronic renal failure, which is stage IV. Presumably this is related to hypertension.--Appears to be at baseline  Anemia of chronic disease. Hemoglobin has been stable in the past                                                                       Hypertension-she continues on hydralazine as well as metoprolol-this appears to be stable as well I    [id number]-of note greater than 40 minutes spent assessing patient-re-assessing patient-discussing her status with nursing staff as well as with her daughter via phone-and coordinating and formulating a plan  of care-of note greater than 50% of time spent coordinating plan of care with family input  .     Marland Kitchen

## 2014-10-02 DIAGNOSIS — R55 Syncope and collapse: Secondary | ICD-10-CM | POA: Insufficient documentation

## 2014-10-27 ENCOUNTER — Other Ambulatory Visit: Payer: Self-pay | Admitting: *Deleted

## 2014-10-27 MED ORDER — ALPRAZOLAM 0.25 MG PO TABS: ORAL_TABLET | ORAL | Status: DC

## 2014-10-27 NOTE — Telephone Encounter (Signed)
[facility] 

## 2014-11-01 ENCOUNTER — Encounter (HOSPITAL_COMMUNITY)
Admission: RE | Admit: 2014-11-01 | Discharge: 2014-11-01 | Disposition: A | Discharge status: Home / Self Care | Payer: Medicare Other | Source: Skilled Nursing Facility | Attending: Internal Medicine | Admitting: Internal Medicine

## 2014-11-01 DIAGNOSIS — I10 Essential (primary) hypertension: Secondary | ICD-10-CM | POA: Insufficient documentation

## 2014-11-01 DIAGNOSIS — N184 Chronic kidney disease, stage 4 (severe): Secondary | ICD-10-CM | POA: Insufficient documentation

## 2014-11-01 LAB — CBC WITH DIFFERENTIAL/PLATELET
BASOS ABS: 0 10*3/uL (ref 0.0–0.1)
Basophils Relative: 1 % (ref 0–1)
Eosinophils Absolute: 0.4 10*3/uL (ref 0.0–0.7)
Eosinophils Relative: 4 % (ref 0–5)
HEMATOCRIT: 33.2 % — AB (ref 36.0–46.0)
Hemoglobin: 10.5 g/dL — ABNORMAL LOW (ref 12.0–15.0)
LYMPHS PCT: 44 % (ref 12–46)
Lymphs Abs: 3.5 10*3/uL (ref 0.7–4.0)
MCH: 29.3 pg (ref 26.0–34.0)
MCHC: 31.6 g/dL (ref 30.0–36.0)
MCV: 92.7 fL (ref 78.0–100.0)
MONO ABS: 0.5 10*3/uL (ref 0.1–1.0)
Monocytes Relative: 7 % (ref 3–12)
Neutro Abs: 3.6 10*3/uL (ref 1.7–7.7)
Neutrophils Relative %: 44 % (ref 43–77)
Platelets: 156 10*3/uL (ref 150–400)
RBC: 3.58 MIL/uL — AB (ref 3.87–5.11)
RDW: 13.1 % (ref 11.5–15.5)
WBC: 8 10*3/uL (ref 4.0–10.5)

## 2014-11-01 LAB — COMPREHENSIVE METABOLIC PANEL
ALT: 9 U/L (ref 0–35)
AST: 12 U/L (ref 0–37)
Albumin: 3 g/dL — ABNORMAL LOW (ref 3.5–5.2)
Alkaline Phosphatase: 75 U/L (ref 39–117)
Anion gap: 8 (ref 5–15)
BUN: 49 mg/dL — ABNORMAL HIGH (ref 6–23)
CALCIUM: 8.6 mg/dL (ref 8.4–10.5)
CHLORIDE: 109 mmol/L (ref 96–112)
CO2: 26 mmol/L (ref 19–32)
Creatinine, Ser: 2.78 mg/dL — ABNORMAL HIGH (ref 0.50–1.10)
GFR calc Af Amer: 16 mL/min — ABNORMAL LOW (ref >90)
GFR calc non Af Amer: 14 mL/min — ABNORMAL LOW (ref >90)
Glucose, Bld: 85 mg/dL (ref 70–99)
Potassium: 4.6 mmol/L (ref 3.5–5.1)
SODIUM: 143 mmol/L (ref 135–145)
TOTAL PROTEIN: 6.1 g/dL (ref 6.0–8.3)
Total Bilirubin: 0.3 mg/dL (ref 0.3–1.2)

## 2014-11-07 ENCOUNTER — Encounter: Payer: Self-pay | Admitting: Internal Medicine

## 2014-11-07 ENCOUNTER — Non-Acute Institutional Stay (SKILLED_NURSING_FACILITY): Payer: Medicare Other | Admitting: Internal Medicine

## 2014-11-07 DIAGNOSIS — F0391 Unspecified dementia with behavioral disturbance: Secondary | ICD-10-CM | POA: Diagnosis not present

## 2014-11-07 DIAGNOSIS — N184 Chronic kidney disease, stage 4 (severe): Secondary | ICD-10-CM | POA: Diagnosis not present

## 2014-11-07 DIAGNOSIS — I10 Essential (primary) hypertension: Secondary | ICD-10-CM

## 2014-11-07 NOTE — Progress Notes (Signed)
Patient ID: [name], female   DOB: 10/15/1923, 81 y.o.   MRN: [id number]   ACILITY: [facility]    LEVEL OF CARE:   SNF  This is a routine visit   CHIEF COMPLAINT:      HISTORY OF PRESENT ILLNESS:  [name] is an 81 year-old woman with severe dementia.  I believe she was admitted to the building after a stay at [facility] for a subcapital fracture of the left femoral neck.  She had a left hip arthroplasty.--She has significant dementia and has a sitter during the day with her   She continues on Aricept as well as Namenda--also on Depakote for behaviors.  Most acute issue recently was a syncopal episode several weeks ago where she became unresponsive after having a bowel movement-however she quickly returned her baseline vital signs are stable-per discussion with her sitter family she does occasionally have syncopal episodes although this has not occurred for some time  She has had some increased agitation and anxiety--she was seen by psychiatric services today-her Depakote Ariceptb and Namenda were discontinued apparently she was not taking these much to begin with she was started on Xanax 0.25 mg twice a day routine and additiona  onel time a day as needed for anxiety  Labs were recently ordered she does have a history of baseline chronic renal insufficiency appears her creatinine has gone up a bit to 2.78 but appears over the past year she has run in the high ones to low twos-BUN this 49 sodium 143 potassium 4.6.  Per her sitters and nursing staff she still drinks fairly well although spotty at times I suspect secondary to agitation            PAST MEDICAL HISTORY/PROBLEM LIST:    Left hip fracture.  Status post left bipolar arthroplasty.    Anemia.    Severe dementia.    Hypertension.    Chronic renal failure stage IV.    History of a UTI.    CURRENT MEDICATIONS:  Medication list is reviewed.  .    Metoprolol 25 b.i.d.    Vytorin 10/20 q.h.s.     Norco 7.5/325 q.4 p.r.n.    Hydralazine 25 b.i.d.      Milk of Magnesia 30 mL daily p.r.n.    ASA 81 q.d.    Colace 100 b.i.d.    Xanax 0.25 mg twice a day routine-and one additional time a day as needed for anxiety .      LABORATORY DATA  11/01/2014.  Sodium 143 potassium 4.6 BUN 49 creatinine 2.78.--- Liver function tests within normal limits except albumin of 3.0  WBC 8.0 hemoglobin 10.5 platelets 156  08/28/2014.  WBC 5.9 hemoglobin 10.4 platelets 138.  :  08/09/2014.  WBC 6.6 hemoglobin 10.5.  Sodium 143 potassium 4 BUN 41 creatinine 2.67.  Liver function tests within normal limits except albumin of 2.9.    Recent lab work on 07/05/2014 showed a white count of 6, hemoglobin 10.4, platelet count 137.    On 05/17/2014:  Sodium was 145, BUN 43, creatinine 2.85.    Liver function tests normal.    Her albumin was 2.9.     Review of systems-essentially unattainable from patient-however according to nursing staff she is not complaining of any pain-no shortness of breath cough-or headache-   PHYSICAL EXAMINATION: To 97.0 pulse 60 respirations 20 blood pressure 144/82-again blood pressure readings and vital signs are limited secondary patient being frequently agitated and refusing  GENERAL  APPEARANCE:  The patient is a woman with severe dementia. Appears to be at baseline she is s agitated with exam which is not new-- a    HEENT: Oropharynx  is clear mucous membranes appear fairly moist She has prescription lenses visual acuity appears grossly intact-  MOUTH/THROAT:   She has few remaining teeth in the upper and lower jaws.   CHEST/RESPIRATORY:  Could not   assess secondary to patient agitation  CARDIOVASCULAR:  CARDIAC:    He would not let me auscultate her chest however her radial pulse appear to have a regular rate  .   GASTROINTESTINAL:  ABDOMEN:   Do not really assess secondary to agitation  Musculoskeletal-appears to be at baseline- Her strength  certainly appears to be intact moves all extremities at baseline .  Marland Kitchen   Neuro-- is grossly intact cranial nerves intact speech is clear     ASSESSMENT/PLAN:     Severe dementia.--This appears to be baseline again she has been seen by psychiatric services with changes made as noted above she essentially is now on Xanax    Behavioral issues related to #1. As noted above      .    Chronic renal failure, which is stage IV. Presumably this is related to hypertension.--Creatinine appears to be rising somewhat--however starting an IV would be virtually impossible-at this point will monitor and update a metabolic panel next lab day--continue to encourage fluids strongly  Anemia of chronic disease. Hemoglobin has been stablepher most recent lab                                                                       Hypertension-she continues on hydralazine as well as metoprolol-this appears to be stable--again monitoring this has been an issue as a suspect this will continue to be a challenge but I do not see greatly elevated systolics and a suspect when they are taken somewhat elevated because of her agitation    [phone]

## 2014-11-08 ENCOUNTER — Other Ambulatory Visit (HOSPITAL_COMMUNITY)
Admission: AD | Admit: 2014-11-08 | Discharge: 2014-11-08 | Disposition: A | Discharge status: Home / Self Care | Payer: Medicare Other | Source: Skilled Nursing Facility | Attending: Family Medicine | Admitting: Family Medicine

## 2014-12-12 ENCOUNTER — Non-Acute Institutional Stay (SKILLED_NURSING_FACILITY): Payer: Medicare Other | Admitting: Internal Medicine

## 2014-12-12 ENCOUNTER — Encounter (HOSPITAL_COMMUNITY)
Admission: RE | Admit: 2014-12-12 | Discharge: 2014-12-12 | Disposition: A | Discharge status: Home / Self Care | Payer: Medicare Other | Source: Ambulatory Visit | Attending: Family Medicine | Admitting: Family Medicine

## 2014-12-12 DIAGNOSIS — N184 Chronic kidney disease, stage 4 (severe): Secondary | ICD-10-CM

## 2014-12-12 DIAGNOSIS — I10 Essential (primary) hypertension: Secondary | ICD-10-CM

## 2014-12-12 DIAGNOSIS — R55 Syncope and collapse: Secondary | ICD-10-CM

## 2014-12-12 DIAGNOSIS — F0391 Unspecified dementia with behavioral disturbance: Secondary | ICD-10-CM

## 2014-12-12 LAB — URINALYSIS, ROUTINE W REFLEX MICROSCOPIC
Bilirubin Urine: NEGATIVE
Glucose, UA: 100 mg/dL — AB
Hgb urine dipstick: NEGATIVE
KETONES UR: NEGATIVE mg/dL
LEUKOCYTES UA: NEGATIVE
Nitrite: NEGATIVE
PROTEIN: 30 mg/dL — AB
Specific Gravity, Urine: 1.02 (ref 1.005–1.030)
Urobilinogen, UA: 0.2 mg/dL (ref 0.0–1.0)
pH: 6 (ref 5.0–8.0)

## 2014-12-12 LAB — URINE MICROSCOPIC-ADD ON

## 2014-12-12 NOTE — Progress Notes (Signed)
Patient ID: NANAYAA LEDET, female   DOB: 12-19-1923, 81 y.o.   MRN: [id number]      ACILITY: [facility]    LEVEL OF CARE:   SNF  This is an acute visit   CHIEF COMPLAINT: Acute visit secondary to unresponsive episode     HISTORY OF PRESENT ILLNESS:  Mrs. Lamprecht is an 81 year-old woman with severe dementia.   she was admitted to the building after a stay at [facility] for a subcapital fracture of the left femoral neck.  She had a left hip arthroplasty.--She has significant dementia and has a sitter during the day with her  She continues on senna 0.25 L twice a day and daily when necessary also on Seroquel 50 mg in the morning 20 mg at night this is followed by psychiatric services   she has a history of syncopal-type episodes recently several weeks ago she had one during a bowel movement in quickly returned to baseline.  Today she apparently had another episode while sitting in the wheelchair-she became unresponsive apparently for approximately 10 minutes she was put back to bed and returned back to her baseline shortly thereafter-by the time I arrived she was responsive somewhat lethargic but returning to her baseline and on recheck she had essentially back at her norm--alert responsive actually resting comfortably.  Initially her blood pressure was noted to be 80/56 believe this was done by machine and on  manual recheck by nurse it was 101/68.---  I do note she is on hydralazine 25 mg twice a day as well as metoprolol 25 mg twice a day.       Labs were recently ordered she does have a history of baseline chronic renal insufficiency appears her creatinine has gone up a bit to 2.78 but appears over the past year she has run in the high ones to low twos-BUN this 49 sodium 143 potassium 4.6.  Per her sitters and nursing staff she still drinks fairly well although spotty at times I suspect secondary to agitation            PAST MEDICAL HISTORY/PROBLEM LIST:    Left  hip fracture.  Status post left bipolar arthroplasty.    Anemia.    Severe dementia.    Hypertension.    Chronic renal failure stage IV.    History of a UTI.    CURRENT MEDICATIONS:  Medication list is reviewed.  .    Metoprolol 25 b.i.d.    Vytorin 10/20 q.h.s.    Norco 7.5/325 q.4 p.r.n.    Hydralazine 25 b.i.d.      Milk of Magnesia 30 mL daily p.r.n.    ASA 81 q.d.    Colace 100 b.i.d.    Xanax 0.25 mg twice a day routine-and one additional time a day as needed for anxiety .      LABORATORY DATA  11/01/2014.  Sodium 143 potassium 4.6 BUN 49 creatinine 2.78.--- Liver function tests within normal limits except albumin of 3.0  WBC 8.0 hemoglobin 10.5 platelets 156  08/28/2014.  WBC 5.9 hemoglobin 10.4 platelets 138.  :  08/09/2014.  WBC 6.6 hemoglobin 10.5.  Sodium 143 potassium 4 BUN 41 creatinine 2.67.  Liver function tests within normal limits except albumin of 2.9.    Recent lab work on 07/05/2014 showed a white count of 6, hemoglobin 10.4, platelet count 137.    On 05/17/2014:  Sodium was 145, BUN 43, creatinine 2.85.    Liver function tests normal.  Her albumin was 2.9.     Review of systems-essentially unattainable from patient-see history of present illness   PHYSICAL EXAMINATION:  She is afebrile pulses 70 respirations 18 blood pressure  101/68 subsequent blood pressures difficult to obtain secondary to patient agitation    GENERAL APPEARANCE:  Patient is resting comfortably in bed --isnot really agitated this point but was later in the day    HEENT: Oropharynx  is clear mucous membranes appear fairly moist She has prescription lenses visual acuity appears grossly intact-  MOUTH/THROAT:   She has few remaining teeth in the upper and lower jaws.   CHEST/RESPIRATORY: There to auscultation with poor respiratory effort  CARDIOVASCULAR:  CARDIAC:    Rate and rhythm with occasional irregular beats-1+ lower extremity edema appears to be  relatively baseline  .   GASTROINTESTINAL:  ABDOMEN:   Soft nontender with positive bowel sounds  Musculoskeletal-- Her strength certainly appears to be intact moves all extremities at baseline .  Marland Kitchen   Neuro-- is grossly intact cranial nerves intact speech is clear --but not talking a whole lot initially  Psych-again does have severe dementia      ASSESSMENT/PLAN:   Unresponsive episode-patient appears to have return to baseline per exams-vital signs somewhat difficult to obtain secondary to agitation-however does appear her blood pressure is somewhat low per recent readings-will discontinue hydralazine and reduce metoprolol to 12.5 mg twice a day-monitor vital signs every shift when patient will allow I suspect this will continue to be a challenge.  Also will obtain a UA C&S.      Severe dementia.--This appears to be baseline again she has been seen by psychiatric services    Behavioral issues related to #1. As noted above      .    Chronic renal failure, which is stage IV. Presumably this is related to hypertension.--Creatinine appears to be rising somewhat--however starting an IV would be virtually impossible-this was discussed with her daughter via phone-she does express understanding of challenging situation here-and is comfortable with just monitoring her I suspect obtaining labs should be extremely difficult at this point secondary to patient agitation-  Anemia of chronic disease. Hemoglobin has been stablepher most recent lab  [id number]-of note greater than 35 minutes spent assessing patient-discussing her status with nursing staff-as well as with her daughter via phone-and coordinating a plan of care with nursing and family input.  Of note greater than 50% of time spent coordinating plan of care

## 2014-12-14 LAB — URINE CULTURE: Colony Count: 30000

## 2014-12-17 ENCOUNTER — Encounter: Payer: Self-pay | Admitting: Internal Medicine

## 2015-01-17 ENCOUNTER — Non-Acute Institutional Stay (SKILLED_NURSING_FACILITY): Payer: Medicare Other | Admitting: Internal Medicine

## 2015-01-17 ENCOUNTER — Encounter: Payer: Self-pay | Admitting: Internal Medicine

## 2015-01-17 DIAGNOSIS — R55 Syncope and collapse: Secondary | ICD-10-CM | POA: Diagnosis not present

## 2015-01-17 DIAGNOSIS — F0391 Unspecified dementia with behavioral disturbance: Secondary | ICD-10-CM

## 2015-01-17 DIAGNOSIS — I10 Essential (primary) hypertension: Secondary | ICD-10-CM

## 2015-01-17 DIAGNOSIS — N184 Chronic kidney disease, stage 4 (severe): Secondary | ICD-10-CM | POA: Diagnosis not present

## 2015-01-17 NOTE — Progress Notes (Signed)
Patient ID: [name], female   DOB: 08-Jun-1924, 81 y.o.   MRN: [id number]       [place]    LEVEL OF CARE:   SNF  This is a [place] visit   CHIEF COMPLAINT  --medical management of chronic medical issues including dementia with behaviors-chronic renal insufficiency-hypertension-   HISTORY OF PRESENT ILLNESS:  [name] is an 81 year-old woman with severe dementia.   she was admitted to the building after a stay at [facility] for a subcapital fracture of the left femoral neck.  She had a left hip arthroplasty.--She has significant dementia and has a sitter during the day with her  She is on Seroquel 50 mg in the morning 25 mg at night this is followed by psychiatric services--she also is on Xanax 0.25 mg twice a day-she is followed closely by psychiatric services.  Her behaviors continue to be an issue often combative with staff which makes giving care quite difficult for staff members.  She drinks and drinks apparently fairly well although there are ups and downs here-.  She does have a history of renal insufficiency creatinine runs mainly in the twos most recently 2.78 back in [place] has been extremely difficult to get any updated labs secondary to patient agitation and family is aware of this and understands  Obtaining and maintaining an IV in the resident  would be virtually impossible   she has a history of syncopal-type episodes but has not had one for several weeks ago--the last one she had was back in April-she quickly returned to baseline-she apparently has been doing this for years.  She does have a history of hypertension she is on Lopressor 12.5 mg twice a day Most recent blood pressure 127/62-obtaining blood pressures again is quite difficult secondary to patient agitation.    Her most acute issue continues to be her agitated behaviors-she is followed again closely by psychiatric services and we will need to have them take a look at her  again. This has been a challenging situation Numerous medications have been discontinued secondary to patient noncompliance this includes Namenda Aricept Depakote-most recently she has been started on Seroquel she is also receiving Ativan gel twice a day when necessary              PAST MEDICAL HISTORY/PROBLEM LIST:    Left hip fracture.  Status post left bipolar arthroplasty.    Anemia.    Severe dementia.    Hypertension.    Chronic renal failure stage IV.    History of a UTI.    CURRENT MEDICATIONS:  Medication list is reviewed.  .    Metoprolol 25 b.i.d.    Vytorin 10/20 q.h.s.    Norco 7.5/325 q.4 p.r.n. .      Milk of Magnesia 30 mL daily p.r.n.    ASA 81 q.d.    Colace 100 b.i.d.    Xanax 0.25 mg twice a day routine   Seroquel 50 mg every morning-25 mg every afternoon.      LABORATORY DATA  11/01/2014.  Sodium 143 potassium 4.6 BUN 49 creatinine 2.78.--- Liver function tests within normal limits except albumin of 3.0  WBC 8.0 hemoglobin 10.5 platelets 156  08/28/2014.  WBC 5.9 hemoglobin 10.4 platelets 138.  :  08/09/2014.  WBC 6.6 hemoglobin 10.5.  Sodium 143 potassium 4 BUN 41 creatinine 2.67.  Liver function tests within normal limits except albumin of 2.9.    Recent lab work on 07/05/2014 showed  a white count of 6, hemoglobin 10.4, platelet count 137.    On 05/17/2014:  Sodium was 145, BUN 43, creatinine 2.85.    Liver function tests normal.    Her albumin was 2.9.     Review of systems-essentially unattainable from patient-see history of present illness   PHYSICAL EXAMINATION:   Temperature 98.0 pulse 68 respirations 20 blood pressure 127/62 weight is 166.4    GENERAL APPEARANCE: Patient is sitting comfortably in her wheelchair however she gets agitated with any attempted examination which limited the examination    HEENT: Oropharynx limited exam but for moderate control mucous membranes are moist She has prescription  lenses visual acuity appears grossly intact-  MOUTH/THROAT:   She has few remaining teeth in the upper and lower jaws.   CHEST/RESPIRATORY: Clear to auscultation with poor respiratory effort -- CARDIOVASCULAR:  CARDIAC:    Regular rhythm slightly tachycardic at approximately 110 during exam - she was agitated -1+ lower extremity edema appears to be relatively baseline  .   GASTROINTESTINAL:  ABDOMEN:   Soft nontender with positive bowel sounds  Musculoskeletal-- Her strength certainly appears to be intact moves all extremities at baseline--can ambulate down the hallway in her wheelchair .  Marland Kitchen   Neuro-- is grossly intact cranial nerves intact speech is clear --but not talking a whole lot initially  Psych-again does have severe dementia      ASSESSMENT/PLAN:          Severe dementia.--With behaviors as noted above-this continues to be challenging will have psychiatric services follow-up on this--          .    Chronic renal failure, which is stage IV. Presumably this is related to hypertension.--Creatinine appears to be rising somewhat--however starting an IV would be virtually impossible-  This has been discussed with the family previously-if at some point patient will allow labs certainly would like to obtain updated laboratory values-  Anemia of chronic disease. Hemoglobin has been stablepher most recent lab  Hypertension-she is on low dose Lopressor appears to be tolerating this well blood pressures are difficult to obtain but recent one appears to be stable  History of syncopal episodes-apparently this has occurred over a period of years-has not been 1 here for approximately the past month-again she seems to recover quickly and return to her baseline after these episodes-at this point continue to monitor  [phone]

## 2015-01-18 ENCOUNTER — Encounter (HOSPITAL_COMMUNITY)
Admission: AD | Admit: 2015-01-18 | Discharge: 2015-01-18 | Disposition: A | Discharge status: Home / Self Care | Payer: Medicare Other | Source: Skilled Nursing Facility | Attending: Family Medicine | Admitting: Family Medicine

## 2015-01-18 DIAGNOSIS — D649 Anemia, unspecified: Secondary | ICD-10-CM | POA: Diagnosis not present

## 2015-01-18 DIAGNOSIS — I129 Hypertensive chronic kidney disease with stage 1 through stage 4 chronic kidney disease, or unspecified chronic kidney disease: Secondary | ICD-10-CM | POA: Diagnosis present

## 2015-01-18 DIAGNOSIS — N184 Chronic kidney disease, stage 4 (severe): Secondary | ICD-10-CM | POA: Insufficient documentation

## 2015-01-18 DIAGNOSIS — Z79899 Other long term (current) drug therapy: Secondary | ICD-10-CM | POA: Insufficient documentation

## 2015-01-18 LAB — COMPREHENSIVE METABOLIC PANEL
ALBUMIN: 3.3 g/dL — AB (ref 3.5–5.0)
ALK PHOS: 105 U/L (ref 38–126)
ALT: 11 U/L — ABNORMAL LOW (ref 14–54)
AST: 13 U/L — ABNORMAL LOW (ref 15–41)
Anion gap: 9 (ref 5–15)
BILIRUBIN TOTAL: 0.6 mg/dL (ref 0.3–1.2)
BUN: 37 mg/dL — AB (ref 6–20)
CHLORIDE: 111 mmol/L (ref 101–111)
CO2: 23 mmol/L (ref 22–32)
Calcium: 9 mg/dL (ref 8.9–10.3)
Creatinine, Ser: 2.42 mg/dL — ABNORMAL HIGH (ref 0.44–1.00)
GFR calc Af Amer: 19 mL/min — ABNORMAL LOW (ref >60)
GFR calc non Af Amer: 17 mL/min — ABNORMAL LOW (ref >60)
Glucose, Bld: 85 mg/dL (ref 65–99)
POTASSIUM: 3.6 mmol/L (ref 3.5–5.1)
Sodium: 143 mmol/L (ref 135–145)
Total Protein: 6.5 g/dL (ref 6.5–8.1)

## 2015-01-18 LAB — CBC WITH DIFFERENTIAL/PLATELET
Basophils Absolute: 0 10*3/uL (ref 0.0–0.1)
Basophils Relative: 1 % (ref 0–1)
EOS ABS: 0.4 10*3/uL (ref 0.0–0.7)
Eosinophils Relative: 5 % (ref 0–5)
HCT: 32.7 % — ABNORMAL LOW (ref 36.0–46.0)
HEMOGLOBIN: 10.4 g/dL — AB (ref 12.0–15.0)
Lymphocytes Relative: 44 % (ref 12–46)
Lymphs Abs: 3 10*3/uL (ref 0.7–4.0)
MCH: 28.5 pg (ref 26.0–34.0)
MCHC: 31.8 g/dL (ref 30.0–36.0)
MCV: 89.6 fL (ref 78.0–100.0)
MONO ABS: 0.4 10*3/uL (ref 0.1–1.0)
MONOS PCT: 6 % (ref 3–12)
NEUTROS PCT: 44 % (ref 43–77)
Neutro Abs: 3 10*3/uL (ref 1.7–7.7)
Platelets: 193 10*3/uL (ref 150–400)
RBC: 3.65 MIL/uL — ABNORMAL LOW (ref 3.87–5.11)
RDW: 12.7 % (ref 11.5–15.5)
WBC: 6.8 10*3/uL (ref 4.0–10.5)

## 2015-01-18 LAB — TSH: TSH: 3.769 u[IU]/mL (ref 0.350–4.500)

## 2015-01-26 ENCOUNTER — Non-Acute Institutional Stay (SKILLED_NURSING_FACILITY): Payer: Medicare Other | Admitting: Internal Medicine

## 2015-01-26 ENCOUNTER — Encounter: Payer: Self-pay | Admitting: Internal Medicine

## 2015-01-26 DIAGNOSIS — R55 Syncope and collapse: Secondary | ICD-10-CM

## 2015-01-26 DIAGNOSIS — R7309 Other abnormal glucose: Secondary | ICD-10-CM | POA: Diagnosis not present

## 2015-01-26 NOTE — Progress Notes (Signed)
Patient ID: [name], female   DOB: 1924/05/13, 81 y.o.   MRN: [id number]       [place]    LEVEL OF CARE:   SNF  This is an acute visit   CHIEF COMPLAINT  -- Acute visit follow-up unresponsive episode-suspect syncopal-   HISTORY OF PRESENT ILLNESS:  [name] is an 81 year-old woman with severe dementia.   she was admitted to the building after a stay at [facility] for a subcapital fracture of the left femoral neck.  She had a left hip arthroplasty.--She has significant dementia and has a sitter during the day with her   Patient appears to have had yet another syncopal episode today-this was a very short duration fact by the time I entered the room she was starting to open her eyes and within a matter of a couple minutes was back to being her baseline agitated making the exam difficult.  She also had a vomiting episode this appeared to be undigested food-according to her sitter this is not unusual either when she has these episodes.  Her vital signs were stable-blood sugar actually was taken which was elevated at 225-she does not have a listed history of diabetes.  Per review of recent metabolic panel her glucose was 85.  She has has significant behaviors recently medications have been made by psychiatric services currently she is on Seroquel 75 mg twice a day and Exelon patch also was started according to her nurse this evening this appears actually to be helping some although her agitation continues to be quite a challenge-.  Obtaining again labs vital signs and doing any sort of aggressive treatment has been quite challenging-her family is aware of this and are comfortable with conservative follow-up     PAST MEDICAL HISTORY/PROBLEM LIST:    Left hip fracture.  Status post left bipolar arthroplasty.    Anemia.    Severe dementia.    Hypertension.    Chronic renal failure stage IV.    History of a UTI.    CURRENT MEDICATIONS:  Medication  list is reviewed.  .    Metoprolol 25 b.i.d.    Vytorin 10/20 q.h.s.    Norco 7.5/325 q.4 p.r.n. .      Milk of Magnesia 30 mL daily p.r.n.    ASA 81 q.d.    Colace 100 b.i.d.    Xanax 0.25 mg twice a day routine   Seroquel 50 mg every morning-25 mg every afternoon.      LABORATORY DATA  01/18/2015.  Sodium 143 potassium 3.6 BUN 37 creatinine 2.42.  WBC 6.8 hemoglobin 10.4 platelets 193  11/01/2014.  Sodium 143 potassium 4.6 BUN 49 creatinine 2.78.--- Liver function tests within normal limits except albumin of 3.0  WBC 8.0 hemoglobin 10.5 platelets 156  08/28/2014.  WBC 5.9 hemoglobin 10.4 platelets 138.  :  08/09/2014.  WBC 6.6 hemoglobin 10.5.  Sodium 143 potassium 4 BUN 41 creatinine 2.67.  Liver function tests within normal limits except albumin of 2.9.    Recent lab work on 07/05/2014 showed a white count of 6, hemoglobin 10.4, platelet count 137.    On 05/17/2014:  Sodium was 145, BUN 43, creatinine 2.85.    Liver function tests normal.    Her albumin was 2.9.     Review of systems-essentially unattainable from patient-see history of present illness   PHYSICAL EXAMINATION:   Temperature 98.0 pulse 76 respirations 18 blood pressure 125/78 CBG 225  GENERAL APPEARANCE: Patient  is sitting comfortably in her wheelchair--initially she was lethargic she was opening her eyes when I entered the room-however within a matter of a minute her eyes were opened she was moving talking and in a couple minutes actually was quite agitated making exam difficult    HEENT: Oropharynx limited exam Since patient did not really open her mouth- .   CHEST/RESPIRATORY: Clear to auscultation with poor respiratory effort -- CARDIOVASCULAR:  CARDIAC:    Regular rhythm  rate of 72 -- she has baseline lower extremity edema  .   GASTROINTESTINAL:  ABDOMEN:   Soft nontender with positive bowel sounds  Musculoskeletal-- Her strength certainly appears to be intact moves  all extremities at baseline--she appears to have significant strength in her arms again is limited exam secondary to her agitation which is not unusual .  .   Neuro-- is grossly intact cranial nerves intact speech is clear   Psych-again does have severe dementia   Labs.  01/18/2015.  Sodium 143 potassium 3.6 BUN 37 creatinine 2.42.  Albumen 3.3-AST 13-ALT 11.  WBC 6.8 hemoglobin 10.4 platelets 193     ASSESSMENT/PLAN:     Syncopal episode-very short duration-suspect recurrent syncopal-again workup here is been quite conservative secondary to patient's severe dementia-she is now back at her baseline.  In regards to vomiting apparently this is not unusual with these episodes-will have her monitored with vital signs pulse ox every shift-again obtaining these may be difficult since patient is back to her baseline and quite agitated with any invasive maneuver.  In regards to the elevated glucose's appears to be a new finding-again monitoring this could be quite difficult with patient's agitation.  This was discussed with Dr. Dellia Nims via [place] patient will allow labs would like to get updated hemoglobin A1c and also an updated metabolic panel and CBC.  Also will have nursing staff attempt to obtain CBGs fasting daily-I suspect this would not be possible some days but would like to get a baseline here     Severe dementia.--With behaviors as noted above-this continues to be challenging but apparently possibly improved some with addition an Exelon patch she is also on Seroquel 75 mg twice a day-          .    Chronic renal failure, which is stage IV.   Creatinine most recently 2.42 BUN 37 which appears to be relatively baseline-    -  Anemia of chronic disease. Hemoglobin has been stablepher most recent lab was 10.4        [id number]

## 2015-01-27 ENCOUNTER — Encounter (HOSPITAL_COMMUNITY)
Admission: RE | Admit: 2015-01-27 | Discharge: 2015-01-27 | Disposition: A | Discharge status: Home / Self Care | Payer: Medicare Other | Attending: Internal Medicine | Admitting: Internal Medicine

## 2015-01-27 DIAGNOSIS — I129 Hypertensive chronic kidney disease with stage 1 through stage 4 chronic kidney disease, or unspecified chronic kidney disease: Secondary | ICD-10-CM | POA: Diagnosis not present

## 2015-01-27 LAB — COMPREHENSIVE METABOLIC PANEL
ALBUMIN: 3.3 g/dL — AB (ref 3.5–5.0)
ALK PHOS: 99 U/L (ref 38–126)
ALT: 9 U/L — AB (ref 14–54)
AST: 13 U/L — ABNORMAL LOW (ref 15–41)
Anion gap: 9 (ref 5–15)
BILIRUBIN TOTAL: 0.4 mg/dL (ref 0.3–1.2)
BUN: 32 mg/dL — AB (ref 6–20)
CO2: 24 mmol/L (ref 22–32)
Calcium: 8.7 mg/dL — ABNORMAL LOW (ref 8.9–10.3)
Chloride: 107 mmol/L (ref 101–111)
Creatinine, Ser: 2.84 mg/dL — ABNORMAL HIGH (ref 0.44–1.00)
GFR calc Af Amer: 16 mL/min — ABNORMAL LOW (ref >60)
GFR calc non Af Amer: 14 mL/min — ABNORMAL LOW (ref >60)
Glucose, Bld: 88 mg/dL (ref 65–99)
Potassium: 3.7 mmol/L (ref 3.5–5.1)
Sodium: 140 mmol/L (ref 135–145)
Total Protein: 6.3 g/dL — ABNORMAL LOW (ref 6.5–8.1)

## 2015-01-27 LAB — CBC WITH DIFFERENTIAL/PLATELET
BASOS PCT: 0 % (ref 0–1)
Basophils Absolute: 0 10*3/uL (ref 0.0–0.1)
EOS ABS: 0.3 10*3/uL (ref 0.0–0.7)
EOS PCT: 4 % (ref 0–5)
HEMATOCRIT: 31.9 % — AB (ref 36.0–46.0)
HEMOGLOBIN: 10.2 g/dL — AB (ref 12.0–15.0)
Lymphocytes Relative: 47 % — ABNORMAL HIGH (ref 12–46)
Lymphs Abs: 3.3 10*3/uL (ref 0.7–4.0)
MCH: 28.7 pg (ref 26.0–34.0)
MCHC: 32 g/dL (ref 30.0–36.0)
MCV: 89.9 fL (ref 78.0–100.0)
Monocytes Absolute: 0.4 10*3/uL (ref 0.1–1.0)
Monocytes Relative: 5 % (ref 3–12)
NEUTROS ABS: 3.1 10*3/uL (ref 1.7–7.7)
Neutrophils Relative %: 44 % (ref 43–77)
Platelets: 196 10*3/uL (ref 150–400)
RBC: 3.55 MIL/uL — ABNORMAL LOW (ref 3.87–5.11)
RDW: 12.8 % (ref 11.5–15.5)
WBC: 7.1 10*3/uL (ref 4.0–10.5)

## 2015-01-30 LAB — HEMOGLOBIN A1C
Hgb A1c MFr Bld: 5.3 % (ref 4.8–5.6)
MEAN PLASMA GLUCOSE: 105 mg/dL

## 2015-02-12 ENCOUNTER — Other Ambulatory Visit: Payer: Self-pay | Admitting: *Deleted

## 2015-02-12 MED ORDER — LORAZEPAM 2 MG/ML PO CONC: ORAL | Status: DC

## 2015-02-12 NOTE — Telephone Encounter (Signed)
[facility] 

## 2015-02-13 ENCOUNTER — Encounter: Payer: Self-pay | Admitting: Internal Medicine

## 2015-02-13 ENCOUNTER — Non-Acute Institutional Stay (SKILLED_NURSING_FACILITY): Payer: Medicare Other | Admitting: Internal Medicine

## 2015-02-13 DIAGNOSIS — R55 Syncope and collapse: Secondary | ICD-10-CM

## 2015-02-13 DIAGNOSIS — F0391 Unspecified dementia with behavioral disturbance: Secondary | ICD-10-CM | POA: Diagnosis not present

## 2015-02-13 NOTE — Progress Notes (Signed)
Patient ID: [name], female   DOB: 1923-11-08, 81 y.o.   MRN: [id number]        [place]    LEVEL OF CARE:   SNF  This is an acute visit   CHIEF COMPLAINT  -- Acute visit follow-up unresponsive episode-s-   HISTORY OF PRESENT ILLNESS:  [name] is an 81 year-old woman with severe dementia.   she was admitted to the building after a stay at [facility] for a subcapital fracture of the left femoral neck.  She had a left hip arthroplasty.--She has significant dementia and has a sitter during the day with her   Patient appears to have had t another syncopal episode today-this was of longer duration  than previous episodes.. She apparently was in the dining room when she became unresponsive when I came her head was rolled back did not appear to be any distress but was not responding however after about 5-10 minutes she started to move her hands then gradually open her eyes and then attempted to verbalize over a matter of several additional minutes.  Blood pressure was 90's over 60s-pulse was in the 50s O2 saturation was in the 90s on room air her blood sugar was 140-she was put back to bed and began napping which is usually her presentation when she has these episodes  When I checked on her later she was sleeping soundly but easily arousable and becoming mildly agitated when I attempted to examine her.  Physical exam was essentially baseline during this episode she has some bradycardia but could not appreciate any overt congestion or diaphoresis for that matter  She was seen by psychiatric services yesterday for increased agitation-her Seroquel was discontinued and she was started on Risperdal 0.25 mg twice a day-she is also on Xanax 0.25 mg twice a day.  According in nursing staff she was somewhat more somnolent today than usual there is some concern possibly that the medication changes may have led to some somnolence.Apparently she ate well at dinner  however.  I did speak with her daughter via phone during this episode-was decided not to send her to the hospital and actually during our conversation patient began to respond        PAST MEDICAL HISTORY/PROBLEM LIST:    Left hip fracture.  Status post left bipolar arthroplasty.    Anemia.    Severe dementia.    Hypertension.    Chronic renal failure stage IV.    History of a UTI.    CURRENT MEDICATIONS:  Medication list is reviewed.  .    Metoprolol 25 b.i.d.    Vytorin 10/20 q.h.s.    Norco 7.5/325 q.4 p.r.n. .      Milk of Magnesia 30 mL daily p.r.n.    ASA 81 q.d.    Colace 100 b.i.d.    Xanax 0.25 mg twice a day routine   Risperdal 0.25 mg twice a day      LABORATORY DATA  01/27/2015.  WBC 7.1 hemoglobin 10.2 platelets 196.  Sodium 140 potassium 3.7 BUN 32 creatinine 2.85.  Albumin 3.3-AST 13--ALT 9-Dass alkaline phosphatase 99--bilirubin 0.4  01/18/2015.  Sodium 143 potassium 3.6 BUN 37 creatinine 2.42.  WBC 6.8 hemoglobin 10.4 platelets 193  11/01/2014.  Sodium 143 potassium 4.6 BUN 49 creatinine 2.78.--- Liver function tests within normal limits except albumin of 3.0  WBC 8.0 hemoglobin 10.5 platelets 156  08/28/2014.  WBC 5.9 hemoglobin 10.4 platelets 138.  :  08/09/2014.  WBC  6.6 hemoglobin 10.5.  Sodium 143 potassium 4 BUN 41 creatinine 2.67.  Liver function tests within normal limits except albumin of 2.9.    Recent lab work on 07/05/2014 showed a white count of 6, hemoglobin 10.4, platelet count 137.    On 05/17/2014:  Sodium was 145, BUN 43, creatinine 2.85.    Liver function tests normal.    Her albumin was 2.9.     Review of systems-essentially unattainable from patient-see history of present illness   PHYSICAL EXAMINATION:   She is afebrile pulse of 55 respirations now 16 blood pressure 92/63-CBG 140 O2 saturation in the 90s on room air  GENERAL APPEARANCE:   On initial exam patient was unresponsive with  head rolled back sitting in her wheelchair-again after a matter of 10 minutes she was responding moving her head opening her eyes moving her extremities and slightly agitated    HEENT: Oropharynx appeared to be clear-pupils appear reactive  .   CHEST/RESPIRATORY: Clear to auscultation with poor respiratory effort -- CARDIOVASCULAR:  CARDIAC:    Regular rhythm  rate of 55--heart sounds were somewhat distant-- -- she has baseline lower extremity edema  .   GASTROINTESTINAL:  ABDOMEN:   Soft nontender with positive bowel sounds  Musculoskeletal--  Initially limbs were limp but then she began moving her extremities and strength appears to be an intact over upper and lower extremities .  Marland Kitchen   Neuro-- again initially unresponsive but then neurologically appear to be at baseline moving her extremities with her baseline strength-speaking although somewhat incoherently which is her baseline-opening her eyes.  Again she was somewhat more sluggish for an extended period on reexam in bed she appeared to be napping but easily responsive mildly agitated with attempt at exam which is her baseline Doing a complete updated exam was essentially not possible secondary to patient agitation   Psych-again does have severe dementia   Labs.  01/27/2015.    01/18/2015.  Sodium 143 potassium 3.6 BUN 37 creatinine 2.42.  Albumen 3.3-AST 13-ALT 11.  WBC 6.8 hemoglobin 10.4 platelets 193     ASSESSMENT/PLAN:     Syncopal episode- Of somewhat longer duration this time-patient initially became responsive although a bit more sluggish than her baseline for a more extended period-now she appears to be at her baseline napping but responsive-we will attempt to obtain lab work when patient will allow including a CBC with differential CMP and TSH-again this could be a challenge in her family is aware of this they wish conservative treatment.  Also monitor with vital signs every 2 hours 2 every 4 hours 2  and then every shift along with neurologic checks.      We'll also hold her Risperdal and Xanax for now-this will need to be reevaluated in the morning    Addendum-per patient's responsible party they are hesitant about doing further labs which is certainly understandable with their concerns for wanting conservative noninvasive care-will discontinue the order for labs and continue to monitor when patient will allow vital signs    [id number]-of note greater than 40 minutes spent assessing patient-reassessing patient-discussing her status with her responsible party via phone-as well as with nursing staff-and coordinating anbd formulating a plan of care-of note greater than 50% of time spent coordinating plan of care          -          .    Chronic renal failure, which is stage IV.   Creatinine most recently 2.84 BUN 32  which appears to be at the upper limits of her recent baseline-    -  Anemia of chronic disease. Hemoglobin has been stablepher most recent lab was 10.2        [id number]--

## 2015-03-13 ENCOUNTER — Encounter: Payer: Self-pay | Admitting: Internal Medicine

## 2015-03-13 ENCOUNTER — Ambulatory Visit (HOSPITAL_COMMUNITY): Payer: Medicare Other | Attending: Internal Medicine

## 2015-03-13 ENCOUNTER — Non-Acute Institutional Stay (SKILLED_NURSING_FACILITY): Payer: Medicare Other | Admitting: Internal Medicine

## 2015-03-13 DIAGNOSIS — R5081 Fever presenting with conditions classified elsewhere: Secondary | ICD-10-CM | POA: Diagnosis not present

## 2015-03-13 DIAGNOSIS — R05 Cough: Secondary | ICD-10-CM | POA: Insufficient documentation

## 2015-03-13 DIAGNOSIS — I10 Essential (primary) hypertension: Secondary | ICD-10-CM | POA: Diagnosis not present

## 2015-03-13 DIAGNOSIS — F0391 Unspecified dementia with behavioral disturbance: Secondary | ICD-10-CM | POA: Diagnosis not present

## 2015-03-13 DIAGNOSIS — N184 Chronic kidney disease, stage 4 (severe): Secondary | ICD-10-CM

## 2015-03-13 NOTE — Progress Notes (Signed)
Patient ID: [name], female   DOB: [birth year], 81 y.o.   MRN: [id number]   This is an acute visit.  Level care skilled.  Facility [facility].  Chief complaint-acute visit secondary to fever of unknown origin.  History of present illness.  Patient is an 81 year old female with severe dementia-she was admitted to building after stay at the hospital for a fracture of the left femoral neck status post arthroplasty.  She continues to have a sitter with her throughout most of the day.  Today nursing staff noted a cough and obtained a temperature which was elevated at 101.5  Other vital signs were quite difficult to obtain secondary to patient's history of severe dementia. With agitation.  Patient previously was seen for syncopal episodes which are somewhat recurrent of unknown etiology nonetheless she appears to quickly recover from these most of the time.  She is also seen by psychiatric services currently is on Risperdal 0.25 mg twice a day-initially there was some concerns this was causing sedation-but she appears to have tolerated current dosing well.  Previous medical history.  Left hip fracture status post repair.  Anemia.  Severe dementia.  Hypertension.  Chronic renal failure stage IV.  History of UTI.  Medications been reviewed per [facility] includes   aspirin 81 mg daily.  Colace.  Lopressor 12.5 mg twice a day.  Xanax 0.25 mg twice a day.  Risperdal 0.25 mg twice a day.  Norco 7. 01/02/2024 milligrams every 4 hours when necessary  Family medical social history as been reviewed recently progress note on 02/13/2015.  Review of systems-this is essentially impossible secondary to patient's severe dementia please see history of present illness apparently according to staff she ate well today did not really note anything different than apparently some cough possible congestion and now the temperature.  Physical exam.  Again vital signs are limited temperature  is 101.5 oral I got a pulse of 90 respirations approximately 18 most recent blood pressure 137/64.  In general this is an elderly female who is quite agitated with exam which makes exam difficult this appears to be her baseline with attempts at invasive maneuvers.  Her skin is warm and dry possibly slightly increased warmth to touch it is not diaphoretic.  Eyes pupils appear reactive to light-oropharynx from what I can tell is clear again this is limited since patient closes her mouth frequently.  Chest I do note some expiratory congestion and wheezing this is more so the anterior lung fields there is no labored breathing.  Heart is regular rate and rhythm without murmur gallop or rub she does not appear to have increased lower extremity edema from baseline.  Her abdomen is soft does not appear to be tender bowel sounds are positive  GU again I could not really appreciate any overt suprapubic tenderness.  Musculoskeletal-moves all her studies at baseline again she has significant strength which is quite evident during her agitated episodes I do not see any different from baseline.  Neurologic again severe dementia cranial nerves appear intact her strength certainly appears to be intact again no lateralizing findings.      Psych again severe dementia agitation as noted previously  Labs.  01/27/2015.  Sodium 140 potassium 3.7 BUN 32 creatinine 2.84.  Albumen 3.3 otherwise liver function tests within normal limits.  WBC 7.1 hemoglobin 10.2 platelets 196.  01/18/2015.  TSH 3.769.  Assessment and plan.  #1 fever of unknown origin-from history of would appear this is most likely respiratory-workup of  this may prove quite challenging-will treat empirically with Rocephin 1 g IM daily for 7 days-patient. in take her meds again is quite questionable at this point-also will attempt to obtain a CBC with differential and metabolic panel when patient will allow-again this is been quite  difficult in the past as well.  I feel she would benefit from nebulizers will attempt Atrovent every 6 hours-when patient will allow I have ordered a chest x-ray and apparently this will be done per nursing they feel they can accomplish this despite the patient's agitation.  She does have Robitussin when necessary for cough again she stated spit this out earlier today I suspect this will continue to be a challenge.  Also will obtain a UA C&S again when patient will allow  Also will monitor vital signs with pulse ox every shift 72 hours  I did discuss the plan of care with her daughter via phone who is in agreement with this and expressed understanding of possible limited capabilities to treat this.   #2 history of severe dementia-again she appears to be at baseline apparently the Risperdal is helping some I do not note any recent sedation concerns she is followed closely by psychiatric services she's continues on Xanax twice a day as well.-She also continues on an Exelon patch  #3 chronic kidney disease this appears to be relatively baseline with a creatinine of 2.84 she appears to run in the mid to higher 2 range again we'll see what update lab tells [place] when patient will allow  #4-anemia of chronic disease this appears to be relatively stable with a hemoglobin of 10.2 on lab done in late May again will await updated lab when patient will allow.  #5 history of hypertension again we have limited reading secondary to patient agitation but this appears to be relatively well controlled she is on Lopressor most recent blood pressure 137/64  I did reevaluate patient later in the evening she appeared to be resting comfortably still had some mild wheezing--physical exam was relatively unchanged although she was calmer--there was no labored breathing or sign of distress.  [id number] note greater than 35 minutes spent assessing patient-discussing her status with nursing staff-discussing her status  with her daughter-and coordinating and formulating a plan of care-of note greater than 50% of time spent coordinating plan of care with family input

## 2015-03-14 ENCOUNTER — Encounter (HOSPITAL_COMMUNITY)
Admission: AD | Admit: 2015-03-14 | Discharge: 2015-03-14 | Disposition: A | Discharge status: Home / Self Care | Payer: Medicare Other | Source: Skilled Nursing Facility | Attending: Family Medicine | Admitting: Family Medicine

## 2015-03-14 DIAGNOSIS — R509 Fever, unspecified: Secondary | ICD-10-CM | POA: Diagnosis present

## 2015-03-14 LAB — URINE MICROSCOPIC-ADD ON

## 2015-03-14 LAB — COMPREHENSIVE METABOLIC PANEL
ALK PHOS: 95 U/L (ref 38–126)
ALT: 9 U/L — ABNORMAL LOW (ref 14–54)
ANION GAP: 6 (ref 5–15)
AST: 14 U/L — ABNORMAL LOW (ref 15–41)
Albumin: 3 g/dL — ABNORMAL LOW (ref 3.5–5.0)
BILIRUBIN TOTAL: 0.4 mg/dL (ref 0.3–1.2)
BUN: 40 mg/dL — ABNORMAL HIGH (ref 6–20)
CALCIUM: 8.2 mg/dL — AB (ref 8.9–10.3)
CO2: 21 mmol/L — ABNORMAL LOW (ref 22–32)
CREATININE: 2.4 mg/dL — AB (ref 0.44–1.00)
Chloride: 111 mmol/L (ref 101–111)
GFR calc Af Amer: 20 mL/min — ABNORMAL LOW (ref >60)
GFR, EST NON AFRICAN AMERICAN: 17 mL/min — AB (ref >60)
GLUCOSE: 91 mg/dL (ref 65–99)
Potassium: 3.8 mmol/L (ref 3.5–5.1)
Sodium: 138 mmol/L (ref 135–145)
Total Protein: 6.2 g/dL — ABNORMAL LOW (ref 6.5–8.1)

## 2015-03-14 LAB — CBC
HCT: 31.8 % — ABNORMAL LOW (ref 36.0–46.0)
Hemoglobin: 10.2 g/dL — ABNORMAL LOW (ref 12.0–15.0)
MCH: 28.2 pg (ref 26.0–34.0)
MCHC: 32.1 g/dL (ref 30.0–36.0)
MCV: 87.8 fL (ref 78.0–100.0)
Platelets: 144 10*3/uL — ABNORMAL LOW (ref 150–400)
RBC: 3.62 MIL/uL — ABNORMAL LOW (ref 3.87–5.11)
RDW: 13.7 % (ref 11.5–15.5)
WBC: 4.6 10*3/uL (ref 4.0–10.5)

## 2015-03-14 LAB — URINALYSIS, ROUTINE W REFLEX MICROSCOPIC
Bilirubin Urine: NEGATIVE
GLUCOSE, UA: NEGATIVE mg/dL
KETONES UR: NEGATIVE mg/dL
Leukocytes, UA: NEGATIVE
NITRITE: NEGATIVE
PH: 6 (ref 5.0–8.0)
Protein, ur: 30 mg/dL — AB
Specific Gravity, Urine: 1.015 (ref 1.005–1.030)
Urobilinogen, UA: 0.2 mg/dL (ref 0.0–1.0)

## 2015-03-16 LAB — URINE CULTURE: Culture: NO GROWTH

## 2015-03-27 ENCOUNTER — Non-Acute Institutional Stay (SKILLED_NURSING_FACILITY): Payer: Medicare Other | Admitting: Internal Medicine

## 2015-03-27 ENCOUNTER — Encounter: Payer: Self-pay | Admitting: Internal Medicine

## 2015-03-27 DIAGNOSIS — R4182 Altered mental status, unspecified: Secondary | ICD-10-CM | POA: Diagnosis not present

## 2015-03-27 DIAGNOSIS — F0391 Unspecified dementia with behavioral disturbance: Secondary | ICD-10-CM | POA: Diagnosis not present

## 2015-03-27 NOTE — Progress Notes (Signed)
Patient ID: [name], female   DOB: 02/10/1924, 81 y.o.   MRN: [id number]     This is an acute visit.  Level care skilled.  Facility [facility].  Chief complaint-acute visit secondary altered mental status.  History of present illness.  Patient is an 81 year old female with severe dementia-she was admitted to building after stay at the hospital for a fracture of the left femoral neck status post arthroplasty.  She continues to have a sitter with her throughout most of the day.  Around noon today patient apparently was starting to talk "gibberish" which despite her dementia is unlike her-her speech actually was clear but again was incongruent unintelligible.  Her vital signs were stable CBG was 140 O2 saturation in the 90s on room air-her strength appeared to be intact neurologically could not see any differences-her sitter thought she had a lid right eye lid lag but by the time I got there could not really appreciate this.  She continued to be somewhat combative with the physical exam which is not new-again she did have this somewhat gibberish type speaking which was the main change.  I also later evaluated her about a half hour later and apparently there was no change in status although she still was talking somewhat gibberish apparently when her sitter try to feed her mostly food just, dribbled out of her mouth.  I did speak with her daughter via phone and discussed options include sending to the ER for possible workup lab work and suspect the CT scan-at this point daughter would prefer she does stay in the facility and be monitored- Family has essentially desired conservative measures in the past as well-secondary to patient's severe dementia     .  Patient previously was seen for syncopal episodes which are somewhat recurrent of unknown etiology nonetheless she appears to quickly recover from these most of the time.  She is also seen by psychiatric services currently is  on Risperdal 0.25 mg twice a day-initially there was some concerns this was causing sedation-but she appears to have tolerated current dosing well.  \Do note she earlier this month completed course of Rocephin for suspected URI-apparently her cough improved significantly-at one point she been febrile but this is not the case today  Previous medical history.  Left hip fracture status post repair.  Anemia.  Severe dementia.  Hypertension.  Chronic renal failure stage IV.  History of UTI.  Medications been reviewed per [facility] includes   aspirin 81 mg daily.  Colace.  Lopressor 12.5 mg twice a day.  Xanax 0.25 mg twice a day.  Risperdal 0.25 mg twice a day.  Norco 0.5-325 mg 1 By mouth every 4 hours when necessary  Family medical social history has been reviewed recently progress note on 02/13/2015 and 03/13/2015.  Review of systems- Essentially unobtainable secondary to severe dementia please see history of present illness.  Physical exam.   Temperature is 97.8 pulse 60 respirations 20 blood pressure 132/75 O2 saturation 94% on room air CBG is 140.  In general -elderly female who is alert responsive talking however her speech is somewhat gibberish    Her skin is warm and dry-- is not diaphoretic.  Eyes pupils appear reactive to light sclera and conjunctivae are clear I did not really appreciate significant lid lag-she does close her eyes quite tightly at times  -oropharynx from what I can tell is clear again this is limited since patient closes her mouth frequently.  Chest Clear to auscultation with  poor respiratory effort again somewhat difficult exam secondary patient agitation she does not follow verbal commands.  Heart is regular rate and rhythm without murmur gallop or rub she does not appear to have increased lower extremity edema from baseline.  Her abdomen is soft does not appear to be  Acutely  tender bowel sounds are positive  GU again I could not really  appreciate any overt suprapubic tenderness.  Musculoskeletal-moves all herextremities at baseline again she has significant strength which is quite evident during her agitated episodes I do not see any different from baseline.  Neurologic again severe dementia cranial nerves appear intact her strength certainly appears to be intact again no lateralizing findings--again her speech is somewhat gibberish.  Psych she does have severe dementia as noted above.       Labs.  03/14/2015.  Sodium 138 potassium 3.8 CO2 21 BUN 40 creatinine 2.4 which is baseline.  Liver function tests showed albumen of 3 AST 14 ALT of 9 otherwise liver function tests within normal limits.  WBC 4.6 hemoglobin 10.2 platelets 144  01/27/2015.  Sodium 140 potassium 3.7 BUN 32 creatinine 2.84.  Albumen 3.3 otherwise liver function tests within normal limits.  WBC 7.1 hemoglobin 10.2 platelets 196.  01/18/2015.  TSH 3.769.  Assessment and plan.  #1--history of somewhat altered mental status is speaking "gibberish"-neurologically she appears to be at baseline-apparently she was not really eating food this noon or really drinking much.  However by this evening things had normalized and she was eating drinking speaking clearly although again with severe dementia this appears to be transitory episode --again family desires conservative follow-up no aggressive interventions at this point will monitor but she appears to be back at her baseline  Of note she does continue on aspirin for anticoagulation .     #2 chronic kidney disease this appears to be relatively baseline with a creatinine of 2.4 she appears to run in the mid to higher 2 range   #3-anemia of chronic disease this appears to be relatively stable with a hemoglobin of 10.2 on lab done earlier this month  [id number]-of note greater than 35 minutes spent assessing patient-discussing her status extensively with nursing staff-as well as with her  daughter via phone-and reassessing patient-and coordinating plan of care---of note greater than 50% of time spent coordinating plan a clear with family input as well as discussion with nursing

## 2015-06-05 ENCOUNTER — Encounter: Payer: Self-pay | Admitting: Internal Medicine

## 2015-06-05 ENCOUNTER — Non-Acute Institutional Stay (SKILLED_NURSING_FACILITY): Payer: Medicare Other | Admitting: Internal Medicine

## 2015-06-05 DIAGNOSIS — N184 Chronic kidney disease, stage 4 (severe): Secondary | ICD-10-CM

## 2015-06-05 DIAGNOSIS — R059 Cough, unspecified: Secondary | ICD-10-CM | POA: Insufficient documentation

## 2015-06-05 DIAGNOSIS — F0391 Unspecified dementia with behavioral disturbance: Secondary | ICD-10-CM | POA: Diagnosis not present

## 2015-06-05 DIAGNOSIS — R05 Cough: Secondary | ICD-10-CM

## 2015-06-05 DIAGNOSIS — I10 Essential (primary) hypertension: Secondary | ICD-10-CM

## 2015-06-05 NOTE — Progress Notes (Signed)
Patient ID: [name], female   DOB: [birth year], 81 y.o.   MRN: [id number]     This is  routine visit.  Level care skilled.  Facility [facility].  Chief complaint--medical management of chronic medical conditions including dementia with behaviors-hypertension-chronic renal insufficiency-acute visit secondary to cough  History of present illness.  Patient is an 81 year old female with severe dementia-she was admitted to building after stay at the hospital for a fracture of the left femoral neck status post arthroplasty.  She continues to have a sitter with her throughout most of the day.   .  She is  seen by psychiatric services currently is on Risperdal 0.25 mg twice a day-initially there was some concerns this was causing sedation-but she appears to have tolerated current dosing well--and per nursing behaviors have improved there are fewer outbursts although this still continues to be a challenge at times.  In regards to other issues she appears to be relatively stable she has baseline renal insufficiency with creatinine in the twos most recently 2.4 we will update this.  Apparently she is eating and drinking fairly well weight appears to be fairly stable.  She has developed cough she is afebrile-apparently this cough is intermittent her sitter has noticed it at times-she does not appear to be febrile appears to be essentially at her baseline today.  .  Previous medical history.  Left hip fracture status post repair.  Anemia.  Severe dementia.  Hypertension.  Chronic renal failure stage IV.  History of UTI.  Medications been reviewed per [facility] includes   aspirin 81 mg daily.  Colace.  Lopressor 12.5 mg twice a day.  Xanax 0.25 mg twice a day.  Risperdal 0.25 mg twice a day.  Norco 7. 01/02/2024 milligrams every 4 hours when necessary aseen reviewe Family medical social history ashd recently progress note on 03/27/2015.  Review of systems-this is  essentially impossible secondary to patient's severe dementia please see history of present illness apparently according to staff she ate well today did not really note anything different than apparently some cough-there's been no temperature elevation  Physical exam.   Temperature 98.3 pulse 100 respirations 14 blood pressure 174/85 baseline appears to be lower than that I see 112/55   previouslyhowever she is agitated which I suspect accounts for the higher systolic reading as well as slightly elevated pulse  In general this is an elderly female who is quite agitated with exam which makes exam difficult this appears to be her baseline with attempts at invasive maneuvers.  Her skin is warm and dry---- does not appear to have increased warmth to touch  Eyes pupils appear reactive to light-oropharynx from what I can tell is clear again this is limited since patient closes her mouth frequently.  Chest I do not note any congestion or increased respiratory effort or labored breathing-she does not really follow verbal commands but I could not really appreciate any abnormalities .  Heart is regular rate and rhythm without murmur gallop or rub she does not appear to have increased lower extremity edema from baseline.  Her abdomen is soft does not appear to be tender bowel sounds are positive  GU again I could not really appreciate any overt suprapubic tenderness.  Musculoskeletal-moves all her studies at baseline again she has significant strength which is quite evident during her agitated episodes I do not see any different from baseline.  Neurologic again severe dementia cranial nerves appear intact her strength certainly appears to be intact again  no lateralizing findings.      Psych again severe dementia agitation as noted previously  Labs.  03/14/2015.  Sodium 138 potassium 3.8 BUN 40 creatinine 2.4.  Albumin 3.0-AST 14-ALT 9 otherwise liver function tests within normal  limits.  CBC 4.6 hemoglobin 10.2 platelets 144  01/27/2015.  Sodium 140 potassium 3.7 BUN 32 creatinine 2.84.  Albumen 3.3 otherwise liver function tests within normal limits.  WBC 7.1 hemoglobin 10.2 platelets 196.  01/18/2015.  TSH 3.769.  Assessment and plan.   .   1history of severe dementia-again she appears to be at baseline apparently the Risperdal is helping some I do not note any recent sedation concerns she is followed closely by psychiatric services she's continues on Xanax twice a day as well.-She also continues on an Exelon patch--will check an hemoglobin A1c secondary to history ofhigh risk meds  #2 chronic kidney disease this appears to be relatively baseline with a creatinine of 2.4 she appears to run in the mid to higher 2 range again we'll see what update lab tells [place] when patient will allow  #3-anemia of chronic disease this appears to be relatively stable with a hemoglobin of 10.2 on lab done in late May again will await updated lab when patient will allow.  #4 history of hypertension again we have limited reading secondary to patient agitation but this appears to be relatively well controlled  Most of the time--- she is on Lopressor-heart rate is borderline tachycardic and systolic is elevated today but she is quite agitated--at this point continue to monitor   #5-cough-clinically she appears stable I do not see any sign of sepsis-at this point will update lab work including CBC-she does have Atrovent nebulizers as needed will add Robitussin routinely every 6 hours for 72 hours and then when necessary-also monitor with vital signs every shift her pulse ox for 72 hours-notify provider of any increased cough congestion  [phone]

## 2015-06-06 ENCOUNTER — Encounter (HOSPITAL_COMMUNITY)
Admission: AD | Admit: 2015-06-06 | Discharge: 2015-06-06 | Disposition: A | Discharge status: Home / Self Care | Payer: Medicare Other | Source: Skilled Nursing Facility | Attending: Internal Medicine | Admitting: Internal Medicine

## 2015-06-06 DIAGNOSIS — I129 Hypertensive chronic kidney disease with stage 1 through stage 4 chronic kidney disease, or unspecified chronic kidney disease: Secondary | ICD-10-CM | POA: Insufficient documentation

## 2015-06-06 DIAGNOSIS — R7309 Other abnormal glucose: Secondary | ICD-10-CM | POA: Insufficient documentation

## 2015-06-06 DIAGNOSIS — N184 Chronic kidney disease, stage 4 (severe): Secondary | ICD-10-CM | POA: Insufficient documentation

## 2015-06-06 LAB — CBC WITH DIFFERENTIAL/PLATELET
BASOS ABS: 0 10*3/uL (ref 0.0–0.1)
Basophils Relative: 1 %
Eosinophils Absolute: 0.4 10*3/uL (ref 0.0–0.7)
Eosinophils Relative: 5 %
HEMATOCRIT: 33.6 % — AB (ref 36.0–46.0)
Hemoglobin: 10.8 g/dL — ABNORMAL LOW (ref 12.0–15.0)
Lymphocytes Relative: 38 %
Lymphs Abs: 2.6 10*3/uL (ref 0.7–4.0)
MCH: 28.1 pg (ref 26.0–34.0)
MCHC: 32.1 g/dL (ref 30.0–36.0)
MCV: 87.5 fL (ref 78.0–100.0)
Monocytes Absolute: 0.4 10*3/uL (ref 0.1–1.0)
Monocytes Relative: 6 %
NEUTROS ABS: 3.5 10*3/uL (ref 1.7–7.7)
Neutrophils Relative %: 50 %
Platelets: 169 10*3/uL (ref 150–400)
RBC: 3.84 MIL/uL — ABNORMAL LOW (ref 3.87–5.11)
RDW: 14.4 % (ref 11.5–15.5)
WBC: 6.9 10*3/uL (ref 4.0–10.5)

## 2015-06-06 LAB — COMPREHENSIVE METABOLIC PANEL
ALT: 9 U/L — ABNORMAL LOW (ref 14–54)
ANION GAP: 6 (ref 5–15)
AST: 13 U/L — ABNORMAL LOW (ref 15–41)
Albumin: 3.2 g/dL — ABNORMAL LOW (ref 3.5–5.0)
Alkaline Phosphatase: 78 U/L (ref 38–126)
BILIRUBIN TOTAL: 0.4 mg/dL (ref 0.3–1.2)
BUN: 51 mg/dL — ABNORMAL HIGH (ref 6–20)
CO2: 23 mmol/L (ref 22–32)
Calcium: 8.1 mg/dL — ABNORMAL LOW (ref 8.9–10.3)
Chloride: 111 mmol/L (ref 101–111)
Creatinine, Ser: 2.18 mg/dL — ABNORMAL HIGH (ref 0.44–1.00)
GFR, EST AFRICAN AMERICAN: 22 mL/min — AB (ref >60)
GFR, EST NON AFRICAN AMERICAN: 19 mL/min — AB (ref >60)
Glucose, Bld: 80 mg/dL (ref 65–99)
POTASSIUM: 4.3 mmol/L (ref 3.5–5.1)
Sodium: 140 mmol/L (ref 135–145)
TOTAL PROTEIN: 6.2 g/dL — AB (ref 6.5–8.1)

## 2015-06-07 LAB — HEMOGLOBIN A1C
HEMOGLOBIN A1C: 5.6 % (ref 4.8–5.6)
Mean Plasma Glucose: 114 mg/dL

## 2015-09-05 ENCOUNTER — Non-Acute Institutional Stay (SKILLED_NURSING_FACILITY): Payer: Medicare Other | Admitting: Internal Medicine

## 2015-09-05 ENCOUNTER — Encounter: Payer: Self-pay | Admitting: Internal Medicine

## 2015-09-05 DIAGNOSIS — N184 Chronic kidney disease, stage 4 (severe): Secondary | ICD-10-CM | POA: Diagnosis not present

## 2015-09-05 DIAGNOSIS — R55 Syncope and collapse: Secondary | ICD-10-CM | POA: Diagnosis not present

## 2015-09-05 DIAGNOSIS — F0391 Unspecified dementia with behavioral disturbance: Secondary | ICD-10-CM

## 2015-09-05 DIAGNOSIS — I10 Essential (primary) hypertension: Secondary | ICD-10-CM | POA: Diagnosis not present

## 2015-09-05 NOTE — Progress Notes (Signed)
Patient ID: [name], female   DOB: 22-May-1924, 82 y.o.   MRN: [id number]      This is  routine visit.  Level care skilled.  Facility [facility].  Chief complaint--medical management of chronic medical conditions including dementia with behaviors-hypertension-chronic renal insufficiency-  History of present illness.  Patient is an 82 year old female with severe dementia-she was admitted to building after stay at the hospital for a fracture of the left femoral neck status post arthroplasty.  She continues to have a sitter with her throughout most of the day.   .  She is  seen by psychiatric services currently is on Risperdal 0.25 mg the morning and 0.5 mg at at bedtime-this appears to be helping with her behaviors there are occasional outburst but apparently this has improved--- initially there was some concern about Risperdal causing oversedation but this does not appear to be the case currently  .  In regards to other issues she appears to be relatively stable she has baseline renal insufficiency with creatinine in the twos most recently 2.18 we will update this.  Apparently she is eating and drinking fairly well weight appears to be fairly stable.  When I saw her October she did have a cough this responded well it appears to Robitussin  Patient does have a history what appears to be syncopal episodes were she'll becomes unresponsive for short period and then fairly quickly returned to her baseline-family does not desire  aggressive workup of this-and actually has been quite a while since she's had one-    .  Previous medical history.  Left hip fracture status post repair.  Anemia.  Severe dementia.  Hypertension.  Chronic renal failure stage IV.  History of UTI.  Medications been reviewed per [facility] includes   aspirin 81 mg daily.  Colace.  Lopressor 12.5 mg twice a day.  Xanax 0.25 mg twice a day.  Risperdal 0.25 mg every morning and 0.5 mg daily at  bedtime.  Norco 7.5/325 milligrams every 4 hours when necessary  Family medical social history reviewed including progress note 06/05/2015.  Review of systems-this is essentially impossible secondary to patient's severe dementia please see history of present illness   Physical exam.   She is afebrile pulse is 64 respirations 20 blood pressure 137/66 weight is 162.2  In general this is an elderly female who actually is allowing a short exam today often she is agitated and that limits the exam   Her skin is warm and dry---- does not appear to have increased warmth to touch--he does have numerous seborrheic keratosis  Eyes pupils appear reactive to light--she has prescription lenses -oropharynx from what I can tell is clear again this is limited since patient closes her mouth frequently.  Chest I do not note any congestion or increased respiratory effort or labored breathing-she does not really follow verbal commands but I could not really appreciate any abnormalities .  Heart is regular rate and rhythm without murmur gallop or rub she does not appear to have increased lower extremity edema from baseline.  Her abdomen is soft does not appear to be tender bowel sounds are positive  GU again I could not really appreciate any overt suprapubic tenderness.  Musculoskeletal-moves all her studies at baseline again she has significant strength which is quite evident during her agitated episodes I do not see any different from baseline.  Neurologic again severe dementia cranial nerves appear intact her strength certainly appears to be intact again no lateralizing findings.  Psych severe dementia but she actually did allow some examination today which is not always the cas  Labs.   06/06/2015.  Sodium 140 potassium 4.3 BUN 51 creatinine 2.18.  Albumin 3.2-AST 13 ALT 9 otherwise liver function tests within normal limits.  WBC 6.9 hemoglobin 10.8 platelets 169  Hemoglobin  A1c-5.6   03/14/2015.  Sodium 138 potassium 3.8 BUN 40 creatinine 2.4.  Albumin 3.0-AST 14-ALT 9 otherwise liver function tests within normal limits.  CBC 4.6 hemoglobin 10.2 platelets 144  01/27/2015.  Sodium 140 potassium 3.7 BUN 32 creatinine 2.84.  Albumen 3.3 otherwise liver function tests within normal limits.  WBC 7.1 hemoglobin 10.2 platelets 196.  01/18/2015.  TSH 3.769.  Assessment and plan.   .   1history of severe dementia-again she appears to be at baseline apparently the Risperdal is helping some I do not note any recent sedation concerns she is followed closely by psychiatric services she's continues on Xanax twice a day as well.-She also continues on an Exelon patch-- Recent hemoglobin A1c 5.6 back in October which is satisfactory we will update  #2 chronic kidney disease this appears to be relatively baseline with a creatinine of 2.18 she appears to run in the mid to higher 2 range again we'll see what update lab tells [place] when patient will allow  #3-anemia of chronic disease this appears to be relatively stable with a hemoglobin of 10.8 on lab done in October again will await updated lab when patient will allow.  #4 history of hypertension again we have limited reading secondary to patient agitation but this appears to be relatively well controlled  Most of the time---   #5-history appear syncopal episodes-at this point will monitor apparently this has occurred on and off for years-- family does not wish aggressive workup       [phone]

## 2015-09-07 ENCOUNTER — Encounter (HOSPITAL_COMMUNITY)
Admission: AD | Admit: 2015-09-07 | Discharge: 2015-09-07 | Disposition: A | Discharge status: Home / Self Care | Payer: Medicare Other | Source: Skilled Nursing Facility | Attending: Internal Medicine | Admitting: Internal Medicine

## 2015-09-07 DIAGNOSIS — D649 Anemia, unspecified: Secondary | ICD-10-CM | POA: Insufficient documentation

## 2015-09-07 LAB — CBC WITH DIFFERENTIAL/PLATELET
Basophils Absolute: 0.1 10*3/uL (ref 0.0–0.1)
Basophils Relative: 1 %
EOS ABS: 0.4 10*3/uL (ref 0.0–0.7)
EOS PCT: 5 %
HCT: 31.8 % — ABNORMAL LOW (ref 36.0–46.0)
Hemoglobin: 10.2 g/dL — ABNORMAL LOW (ref 12.0–15.0)
LYMPHS ABS: 2.4 10*3/uL (ref 0.7–4.0)
Lymphocytes Relative: 33 %
MCH: 28.8 pg (ref 26.0–34.0)
MCHC: 32.1 g/dL (ref 30.0–36.0)
MCV: 89.8 fL (ref 78.0–100.0)
Monocytes Absolute: 0.4 10*3/uL (ref 0.1–1.0)
Monocytes Relative: 6 %
Neutro Abs: 4.1 10*3/uL (ref 1.7–7.7)
Neutrophils Relative %: 55 %
PLATELETS: 179 10*3/uL (ref 150–400)
RBC: 3.54 MIL/uL — AB (ref 3.87–5.11)
RDW: 13.1 % (ref 11.5–15.5)
WBC: 7.3 10*3/uL (ref 4.0–10.5)

## 2015-09-08 LAB — HEMOGLOBIN A1C
Hgb A1c MFr Bld: 5.5 % (ref 4.8–5.6)
MEAN PLASMA GLUCOSE: 111 mg/dL

## 2015-09-13 DIAGNOSIS — F0391 Unspecified dementia with behavioral disturbance: Secondary | ICD-10-CM | POA: Diagnosis not present

## 2015-09-13 DIAGNOSIS — F411 Generalized anxiety disorder: Secondary | ICD-10-CM | POA: Diagnosis not present

## 2015-09-13 DIAGNOSIS — F29 Unspecified psychosis not due to a substance or known physiological condition: Secondary | ICD-10-CM | POA: Diagnosis not present

## 2015-09-13 DIAGNOSIS — F39 Unspecified mood [affective] disorder: Secondary | ICD-10-CM | POA: Diagnosis not present

## 2015-09-21 ENCOUNTER — Other Ambulatory Visit (HOSPITAL_COMMUNITY)
Admission: AD | Admit: 2015-09-21 | Discharge: 2015-09-21 | Disposition: A | Discharge status: Home / Self Care | Payer: Medicare Other | Source: Skilled Nursing Facility | Attending: Internal Medicine | Admitting: Internal Medicine

## 2015-09-21 DIAGNOSIS — N390 Urinary tract infection, site not specified: Secondary | ICD-10-CM | POA: Diagnosis present

## 2015-09-22 LAB — URINE MICROSCOPIC-ADD ON

## 2015-09-22 LAB — URINALYSIS, ROUTINE W REFLEX MICROSCOPIC
BILIRUBIN URINE: NEGATIVE
GLUCOSE, UA: 100 mg/dL — AB
KETONES UR: NEGATIVE mg/dL
Nitrite: POSITIVE — AB
PROTEIN: 30 mg/dL — AB
Specific Gravity, Urine: 1.02 (ref 1.005–1.030)
pH: 6 (ref 5.0–8.0)

## 2015-09-24 DIAGNOSIS — M79672 Pain in left foot: Secondary | ICD-10-CM | POA: Diagnosis not present

## 2015-09-24 DIAGNOSIS — M79671 Pain in right foot: Secondary | ICD-10-CM | POA: Diagnosis not present

## 2015-09-24 DIAGNOSIS — B351 Tinea unguium: Secondary | ICD-10-CM | POA: Diagnosis not present

## 2015-09-25 LAB — URINE CULTURE: Culture: >=100000

## 2015-10-02 DIAGNOSIS — F29 Unspecified psychosis not due to a substance or known physiological condition: Secondary | ICD-10-CM | POA: Diagnosis not present

## 2015-10-02 DIAGNOSIS — F0391 Unspecified dementia with behavioral disturbance: Secondary | ICD-10-CM | POA: Diagnosis not present

## 2015-10-02 DIAGNOSIS — F411 Generalized anxiety disorder: Secondary | ICD-10-CM | POA: Diagnosis not present

## 2015-12-05 ENCOUNTER — Encounter: Payer: Self-pay | Admitting: Internal Medicine

## 2015-12-05 ENCOUNTER — Non-Acute Institutional Stay (SKILLED_NURSING_FACILITY): Payer: Medicare Other | Admitting: Internal Medicine

## 2015-12-05 DIAGNOSIS — R55 Syncope and collapse: Secondary | ICD-10-CM

## 2015-12-05 DIAGNOSIS — I10 Essential (primary) hypertension: Secondary | ICD-10-CM

## 2015-12-05 DIAGNOSIS — F0391 Unspecified dementia with behavioral disturbance: Secondary | ICD-10-CM

## 2015-12-05 NOTE — Progress Notes (Signed)
Patient ID: [name], female   DOB: [birth year], 82 y.o.   MRN: [id number]       This is  routine visit.  Level care skilled.  Facility [facility].  Chief complaint--medical management of chronic medical conditions including dementia with behaviors-hypertension-chronic renal insufficiency-  History of present illness.  Patient is an 82 year old female with severe dementia-she was admitted to building after stay at the hospital for a fracture of the left femoral neck status post arthroplasty.  She continues to have a sitter with her throughout most of the day.   .  She is  seen by psychiatric services currently is on Risperdal 0.5 -mg BID-this appears to be helping with her behaviors generally- there are occasional outburst but apparently this has improved--- initially there was some concern about Risperdal causing oversedation but this does not appear to be the case currently  . At one point she had been on an Exelon patch but apparently this was discontinued secondary to skin irritation with the patches  In regards to other issues she appears to be relatively stable she has baseline renal insufficiency with creatinine in the twos most recently 2.18 we this appears to be helping with her behaviors these have stabilized somewhat will update this  if. Patient will allow labs  Apparently she is eating and drinking fairly well appears she's gained about 8 pounds over the past couple months-although I suspect there is some scale variation here  When I saw her October she did have a cough this responded well it appears to Robitussin  Patient does have a history what appears to be syncopal episodes were she'll becomes unresponsive for short period and then fairly quickly returned to her baseline-family does not desire  aggressive workup of this-and actually has been quite a while since she's had one- Nursing does not report any issues today she allowed a limited exam today which is  the baseline does have significant agitation at times especially with invasive maneuvers    .  Previous medical history.  Left hip fracture status post repair.  Anemia.  Severe dementia.  Hypertension.  Chronic renal failure stage IV.  History of UTI.  Medications been reviewed per [facility] includes   aspirin 81 mg daily.  Colace.  Lopressor 12.5 mg twice a day.  Xanax 0.25 mg twice a day.  Risperdal 0.5 mg by mouth twice a day  Norco 7.5/325 milligrams every 4 hours when necessary  Aspirin 81 mg daily.  Colace 100 mg 3 times a day when necessary.    Family medical social history reviewed including progress note 06/05/2015.  Review of systems-this is essentially impossible secondary to patient's severe dementia please see history of present illness   Physical exam.   Temperature is 97.0 pulse 72 respirations 20 blood pressure 138/64 all these appear relatively baseline weight is 170 this appears again of about 8 pounds since January apparently she is eating fairly well  In general this is an elderly female who actually is allowing a short exam today often she is agitated and that limits the exam   Her skin is warm and dry---- does not appear to have increased warmth to touch--he does have numerous seborrheic keratosis  Eyes pupils appear reactive to light- -oropharynx from what I can tell is clear again this is limited since patient closes her mouth frequently.  Chest I do not note any congestion or increased respiratory effort or labored breathing-she does not really follow verbal commands but I could  not really appreciate any congestion .  Heart is regular rate and rhythm without murmur gallop or rub she does not appear to have increased lower extremity edema from baseline.  Her abdomen is soft does not appear to be tender bowel sounds are positive  GU again I could not really appreciate any overt suprapubic tenderness.  Musculoskeletal-moves all her  studies at baseline again she has significant strength which is quite evident during her agitated episodes I do not see any different from baseline.  Neurologic again severe dementia cranial nerves appear intact her strength certainly appears to be intact again no lateralizing findings.  Psych severe dementia but she actually did allow some examination today which is not always the case  Labs.  Sep 07 2015.  WBC 7.3 hemoglobin 10.2 platelets 179.  Hemoglobin A1c 5.5.     06/06/2015.  Sodium 140 potassium 4.3 BUN 51 creatinine 2.18.  Albumin 3.2-AST 13 ALT 9 otherwise liver function tests within normal limits.  WBC 6.9 hemoglobin 10.8 platelets 169  Hemoglobin A1c-5.6   03/14/2015.  Sodium 138 potassium 3.8 BUN 40 creatinine 2.4.  Albumin 3.0-AST 14-ALT 9 otherwise liver function tests within normal limits.  CBC 4.6 hemoglobin 10.2 platelets 144  01/27/2015.  Sodium 140 potassium 3.7 BUN 32 creatinine 2.84.  Albumen 3.3 otherwise liver function tests within normal limits.  WBC 7.1 hemoglobin 10.2 platelets 196.  01/18/2015.  TSH 3.769.  Assessment and plan.   .   1history of severe dementia-again she appears to be at baseline apparently the Risperdal is helping some I do not note any recent sedation concerns she is followed closely by psychiatric services she's continues on Xanax twice a day as well. sha patch been discontinued apparently secondary to skin issues- Recent hemoglobin A1c 5.5 back in January appear satisfactory  #2 chronic kidney disease this appears to be relatively baseline with a creatinine of 2.18 she appears to run in the mid to higher 2 range again we'll see what update lab tells [place] when patient will allow  #3-anemia of chronic disease this appears to be relatively stable with a hemoglobin of 10.2 on lab done in January will attempt update thiswhen patient will allow lab draw  #4 history of hypertension again we have limited reading  secondary to patient agitation but this appears to be relatively well controlled  Most of the time-recent readings 138/64-133/78--   #5-history appear syncopal episodes-at this point will monitor apparently this has occurred on and off for years-- family does not wish aggressive workup       [phone]

## 2015-12-06 ENCOUNTER — Encounter (HOSPITAL_COMMUNITY)
Admission: RE | Admit: 2015-12-06 | Discharge: 2015-12-06 | Disposition: A | Discharge status: Home / Self Care | Payer: Medicare Other | Source: Skilled Nursing Facility | Attending: Internal Medicine | Admitting: Internal Medicine

## 2015-12-06 DIAGNOSIS — N184 Chronic kidney disease, stage 4 (severe): Secondary | ICD-10-CM | POA: Insufficient documentation

## 2015-12-06 DIAGNOSIS — I129 Hypertensive chronic kidney disease with stage 1 through stage 4 chronic kidney disease, or unspecified chronic kidney disease: Secondary | ICD-10-CM | POA: Insufficient documentation

## 2015-12-06 DIAGNOSIS — F0391 Unspecified dementia with behavioral disturbance: Secondary | ICD-10-CM | POA: Diagnosis present

## 2015-12-06 DIAGNOSIS — E785 Hyperlipidemia, unspecified: Secondary | ICD-10-CM | POA: Diagnosis not present

## 2015-12-06 DIAGNOSIS — D649 Anemia, unspecified: Secondary | ICD-10-CM | POA: Diagnosis present

## 2015-12-06 LAB — CBC WITH DIFFERENTIAL/PLATELET
BASOS PCT: 1 %
Basophils Absolute: 0 10*3/uL (ref 0.0–0.1)
EOS ABS: 0.4 10*3/uL (ref 0.0–0.7)
EOS PCT: 6 %
HCT: 33.6 % — ABNORMAL LOW (ref 36.0–46.0)
Hemoglobin: 10.9 g/dL — ABNORMAL LOW (ref 12.0–15.0)
LYMPHS ABS: 2.7 10*3/uL (ref 0.7–4.0)
Lymphocytes Relative: 37 %
MCH: 28.8 pg (ref 26.0–34.0)
MCHC: 32.4 g/dL (ref 30.0–36.0)
MCV: 88.9 fL (ref 78.0–100.0)
MONOS PCT: 5 %
Monocytes Absolute: 0.3 10*3/uL (ref 0.1–1.0)
NEUTROS PCT: 51 %
Neutro Abs: 3.7 10*3/uL (ref 1.7–7.7)
PLATELETS: 190 10*3/uL (ref 150–400)
RBC: 3.78 MIL/uL — ABNORMAL LOW (ref 3.87–5.11)
RDW: 13.6 % (ref 11.5–15.5)
WBC: 7.1 10*3/uL (ref 4.0–10.5)

## 2015-12-06 LAB — COMPREHENSIVE METABOLIC PANEL
ALBUMIN: 3.3 g/dL — AB (ref 3.5–5.0)
ALT: 10 U/L — ABNORMAL LOW (ref 14–54)
AST: 12 U/L — ABNORMAL LOW (ref 15–41)
Alkaline Phosphatase: 69 U/L (ref 38–126)
Anion gap: 7 (ref 5–15)
BUN: 50 mg/dL — AB (ref 6–20)
CHLORIDE: 112 mmol/L — AB (ref 101–111)
CO2: 23 mmol/L (ref 22–32)
CREATININE: 1.99 mg/dL — AB (ref 0.44–1.00)
Calcium: 8.6 mg/dL — ABNORMAL LOW (ref 8.9–10.3)
GFR calc non Af Amer: 21 mL/min — ABNORMAL LOW (ref >60)
GFR, EST AFRICAN AMERICAN: 24 mL/min — AB (ref >60)
Glucose, Bld: 93 mg/dL (ref 65–99)
POTASSIUM: 4.8 mmol/L (ref 3.5–5.1)
SODIUM: 142 mmol/L (ref 135–145)
Total Bilirubin: 0.4 mg/dL (ref 0.3–1.2)
Total Protein: 6.5 g/dL (ref 6.5–8.1)

## 2015-12-07 LAB — HEMOGLOBIN A1C
HEMOGLOBIN A1C: 5.4 % (ref 4.8–5.6)
Mean Plasma Glucose: 108 mg/dL

## 2015-12-26 DIAGNOSIS — F0391 Unspecified dementia with behavioral disturbance: Secondary | ICD-10-CM | POA: Diagnosis not present

## 2015-12-26 DIAGNOSIS — F411 Generalized anxiety disorder: Secondary | ICD-10-CM | POA: Diagnosis not present

## 2015-12-26 DIAGNOSIS — F29 Unspecified psychosis not due to a substance or known physiological condition: Secondary | ICD-10-CM | POA: Diagnosis not present

## 2015-12-27 ENCOUNTER — Encounter (HOSPITAL_COMMUNITY)
Admission: RE | Admit: 2015-12-27 | Discharge: 2015-12-27 | Disposition: A | Discharge status: Home / Self Care | Payer: Medicare Other | Source: Skilled Nursing Facility | Attending: Internal Medicine | Admitting: Internal Medicine

## 2015-12-27 DIAGNOSIS — D649 Anemia, unspecified: Secondary | ICD-10-CM | POA: Diagnosis not present

## 2015-12-27 DIAGNOSIS — E785 Hyperlipidemia, unspecified: Secondary | ICD-10-CM | POA: Diagnosis not present

## 2015-12-27 DIAGNOSIS — I129 Hypertensive chronic kidney disease with stage 1 through stage 4 chronic kidney disease, or unspecified chronic kidney disease: Secondary | ICD-10-CM | POA: Diagnosis not present

## 2015-12-27 DIAGNOSIS — N184 Chronic kidney disease, stage 4 (severe): Secondary | ICD-10-CM | POA: Diagnosis not present

## 2015-12-27 DIAGNOSIS — F0391 Unspecified dementia with behavioral disturbance: Secondary | ICD-10-CM | POA: Diagnosis not present

## 2015-12-27 LAB — LIPID PANEL
CHOL/HDL RATIO: 3.8 ratio
CHOLESTEROL: 166 mg/dL (ref 0–200)
HDL: 44 mg/dL (ref >40)
LDL Cholesterol: 114 mg/dL — ABNORMAL HIGH (ref 0–99)
Triglycerides: 39 mg/dL (ref <150)
VLDL: 8 mg/dL (ref 0–40)

## 2016-01-10 ENCOUNTER — Encounter: Payer: Self-pay | Admitting: Internal Medicine

## 2016-01-10 ENCOUNTER — Non-Acute Institutional Stay (SKILLED_NURSING_FACILITY): Payer: Medicare Other | Admitting: Internal Medicine

## 2016-01-10 DIAGNOSIS — F0391 Unspecified dementia with behavioral disturbance: Secondary | ICD-10-CM

## 2016-01-10 DIAGNOSIS — N184 Chronic kidney disease, stage 4 (severe): Secondary | ICD-10-CM | POA: Diagnosis not present

## 2016-01-10 DIAGNOSIS — I10 Essential (primary) hypertension: Secondary | ICD-10-CM | POA: Diagnosis not present

## 2016-01-10 DIAGNOSIS — R7309 Other abnormal glucose: Secondary | ICD-10-CM

## 2016-01-10 NOTE — Assessment & Plan Note (Addendum)
Because of hypertension comorbidities and polypharmacy; BMET monitor every 4-6 weeks is appropriate

## 2016-01-10 NOTE — Assessment & Plan Note (Signed)
Blood pressure markedly variable but overall control appears to be adequate

## 2016-01-10 NOTE — Assessment & Plan Note (Signed)
A1c monitoring is not necessary

## 2016-01-10 NOTE — Assessment & Plan Note (Signed)
Increase alprazolam to every 8 hours if needed

## 2016-01-10 NOTE — Progress Notes (Signed)
   Facility Location: [place] Room Number: 106-D  This is a nursing facility follow up  of chronic medical diagnosis  Interim medical record and care since last [place] visit was updated with review of diagnostic studies and change in clinical status since last visit were documented.  HPI: The patient has been in [facility] since her left femoral neck fracture and left hip arthroplast in March 2015 . The patient has dementia; other diagnoses include chronic kidney disease; hypertension and hyperglycemia. She had extensive lab studies done 12/06/15 , slightly more than one month ago. Her creatinine was 1.99 down from a high of 2.4 last July. Random glucoses have ranged from 80-114. Her A1c has been nondiabetic with all values less than 5.6%. She has had chronic normochromic, normocytic anemia which has been stable. She is not on a statin appropriately. Her lipids were checked 4/27. They were excellent and there was no indication for statin initiation. Blood pressure has been variable;but overall control is adequate.  She has had systolic blood pressures as low as 91 and as high as 174. A component to the variable BPs is felt to be her altered mental status with agitation and combativeness.  She has been noted to strike out at individuals.    Comprehensive review of systems: Her severe dementia and declining to answer questions  prevented review of systems   Physical exam:  Pertinent or positive findings:Initially patient was resting but she became agitated when I began to ask questions. Repeatedly she tried to push my hands away as I attempted to examine her. Limited exam suggested good dental hygiene. There was no respiratory distress. Heart rhythm was regular but slightly rapid  She had no apparent abdominal tenderness. No edema was noted. The only palpable pulse appeared to be the left dorsalis pedis pulse.  Both LE were in a cushion support device.   General  appearance:Adequately nourished; no acute distress despite agitationLymphatic: No lymphadenopathy about the head, neck, axilla . Eyes: No conjunctival inflammation or lid edema is present. There is no scleral icterus. Ears:  External ear exam shows no significant lesions or deformities.   Nose:  External nasal examination shows no deformity or inflammation. Nasal mucosa are pink and moist without lesions ,exudates Neck:  No  masses, tenderness noted.    Heart:  No gallop, murmur, click, rub .  Lungs:Chest clear to auscultation without wheezes, rhonchi,rales , rubs. Abdomen:Bowel sounds are normal. Abdomen is soft and nontender with no organomegaly, hernias,masses. GU: deferred  Extremities:  No cyanosis, clubbing,edema  Neurologic exam : Strength good  in upper extremities Balance,Rhomberg,finger to nose testing could not be completed due to clinical state Skin: Warm & dry w/o tenting. No significant lesions or rash.    See summary under each active problem in the Problem List with associated updated therapeutic plan

## 2016-01-10 NOTE — Patient Instructions (Signed)
New orders for [facility] entry.  BMET  She does not need any monitor of lipids or A1c due to her advanced age and severe dementia. CBC is indicated should she have any evidence of bleeding. Alprazolam 0.25 every 8 hours as trial for agitation. Cut back to every 12 hours if she is excessively sedated on the every 8 hour dose

## 2016-01-11 ENCOUNTER — Encounter (HOSPITAL_COMMUNITY)
Admission: AD | Admit: 2016-01-11 | Discharge: 2016-01-11 | Disposition: A | Discharge status: Home / Self Care | Payer: Medicare Other | Source: Skilled Nursing Facility | Attending: Internal Medicine | Admitting: Internal Medicine

## 2016-01-11 DIAGNOSIS — N184 Chronic kidney disease, stage 4 (severe): Secondary | ICD-10-CM | POA: Insufficient documentation

## 2016-01-11 DIAGNOSIS — E785 Hyperlipidemia, unspecified: Secondary | ICD-10-CM | POA: Diagnosis not present

## 2016-01-11 DIAGNOSIS — I129 Hypertensive chronic kidney disease with stage 1 through stage 4 chronic kidney disease, or unspecified chronic kidney disease: Secondary | ICD-10-CM | POA: Diagnosis present

## 2016-01-11 DIAGNOSIS — F0391 Unspecified dementia with behavioral disturbance: Secondary | ICD-10-CM | POA: Diagnosis present

## 2016-01-11 DIAGNOSIS — D649 Anemia, unspecified: Secondary | ICD-10-CM | POA: Insufficient documentation

## 2016-01-11 LAB — BASIC METABOLIC PANEL
ANION GAP: 5 (ref 5–15)
BUN: 44 mg/dL — ABNORMAL HIGH (ref 6–20)
CO2: 23 mmol/L (ref 22–32)
Calcium: 8.7 mg/dL — ABNORMAL LOW (ref 8.9–10.3)
Chloride: 111 mmol/L (ref 101–111)
Creatinine, Ser: 2.29 mg/dL — ABNORMAL HIGH (ref 0.44–1.00)
GFR calc Af Amer: 21 mL/min — ABNORMAL LOW (ref >60)
GFR calc non Af Amer: 18 mL/min — ABNORMAL LOW (ref >60)
GLUCOSE: 87 mg/dL (ref 65–99)
POTASSIUM: 4.9 mmol/L (ref 3.5–5.1)
Sodium: 139 mmol/L (ref 135–145)

## 2016-01-22 ENCOUNTER — Non-Acute Institutional Stay (SKILLED_NURSING_FACILITY): Payer: Medicare Other | Admitting: Internal Medicine

## 2016-01-22 DIAGNOSIS — N184 Chronic kidney disease, stage 4 (severe): Secondary | ICD-10-CM | POA: Diagnosis not present

## 2016-01-22 DIAGNOSIS — F0391 Unspecified dementia with behavioral disturbance: Secondary | ICD-10-CM

## 2016-01-22 DIAGNOSIS — R55 Syncope and collapse: Secondary | ICD-10-CM

## 2016-01-22 NOTE — Progress Notes (Signed)
Patient ID: [name], female   DOB: Sep 18, 1923, 82 y.o.   MRN: [id number]        This is  An acute visit.  Level care skilled.  Facility [facility].  Chief complaint--acute visit follow-up syncopal episode yesterday  History of present illness.  Patient is an 82 year old female with severe dementia-she was admitted to building after stay at the hospital for a fracture of the left femoral neck status post arthroplasty.  She continues to have a sitter with her throughout most of the day. With a history of severe dementia.  She is on Risperdal dose 0.5 mg twice a day   She does have a history of renal disease most recent creatinine 2.29 appears to be relatively baseline for her.  Patient has had a series of syncopal episodes over the past couple years-she usually responds to going back to bed and having her legs elevated.  She is actually had an extended course here without any syncopal episodes but apparently had one yesterday.  Apparently she was eating lunch and became unresponsive-she was put to bed her head was lowered legs raised and in a short period time apparently returned to baseline awake alert and agitated with any attempt at exam.  Her vital signs taken at the time appear to be stable blood pressure was 126/72 pulse was 72 O2 saturation was 95% on room air.  Today she continues to be stable and herself was up sitting in the hallway earlier today in her wheelchair I assessed her later today in bed she was alert responsive again a limited exam secondary to agitation which is baseline for patient.  Nursing does not report any issues today --  We have discussed patient's syncopal episodes with her responsible party previously and have expressed desires for no aggressive workup of the syncopal episodes no hospitalization       .       Marland Kitchen  Previous medical history.  Left hip fracture status post repair.  Anemia.  Severe  dementia.  Hypertension.  Chronic renal failure stage IV.  History of UTI.  Medications been reviewed per [facility] includes   aspirin 81 mg daily.  Colace.  Lopressor 12.5 mg twice a day.  Xanax 0.25 mg twice a day.  Risperdal 0.5 mg by mouth twice a day  Norco 7.5/325 milligrams every 4 hours when necessary  Aspirin 81 mg daily.  Colace 100 mg 3 times a day when necessary.    Family medical social history reviewed including progress note 06/05/2015.  Review of systems-this is essentially impossible secondary to patient's severe dementia please see history of present illness   Physical exam.   Pulse is 94 respirations 16 blood pressure was difficult to obtain secondary to agitation  In general this is an elderly female who actually is allowing a short exam today often she is agitated and that limits the exam-she is resting in bed comfortably I do not see any signs of distress   Her skin is warm and dry---- does have numerous seborrheic keratosis  Eyes pupils appear reactive to light- -oropharynx from what I can tell is clear again this is limited since patient closes her mouth frequently.  Chest I do not note any congestion or increased respiratory effort or labored breathing-she does not really follow verbal commands but I could not really appreciate any congestion .  Heart is regular rate and rhythm without murmur gallop or rub she does not appear to have increased lower extremity edema  from baseline.  Her abdomen was difficult to assess secondary to patient agitation but cursory exam did not really reveal any notable abnormalities  GU again I could not really appreciate any overt suprapubic tenderness.  Musculoskeletal-moves all her studies at baseline again she has significant strength which is quite evident during her agitated episodes I do not see any different from baseline.  Neurologic again severe dementia cranial nerves appear intact her strength  appears  to be intact again no lateralizing findings.  Psych severe dementia limited the exam  Labs.  01/11/2016.  Sodium 139 potassium 4.9 urine 44 creatinine 2.29.  12/27/2015.  Cholesterol 166-triglycerides 39-HDL 44-LDL 114.  12/06/2015.  WBC 7.1 hemoglobin 10.9 platelets 190  Sep 07 2015.  WBC 7.3 hemoglobin 10.2 platelets 179.  Hemoglobin A1c 5.5.     06/06/2015.  Sodium 140 potassium 4.3 BUN 51 creatinine 2.18.  Albumin 3.2-AST 13 ALT 9 otherwise liver function tests within normal limits.  WBC 6.9 hemoglobin 10.8 platelets 169  Hemoglobin A1c-5.6   03/14/2015.  Sodium 138 potassium 3.8 BUN 40 creatinine 2.4.  Albumin 3.0-AST 14-ALT 9 otherwise liver function tests within normal limits.  CBC 4.6 hemoglobin 10.2 platelets 144  01/27/2015.  Sodium 140 potassium 3.7 BUN 32 creatinine 2.84.  Albumen 3.3 otherwise liver function tests within normal limits.  WBC 7.1 hemoglobin 10.2 platelets 196.  01/18/2015.  TSH 3.769.  Assessment and plan.   Marland Kitchen    History of syncopal episodes-this has been stable for some time although she did have one yesterday-today she appears at baseline she is alert confused agitated with exam which is not new at this point continue to monitor I suspect we will continue to deal with syncopal episodes on occasion nonetheless --appears to recover from these quite unremarkably family does not wish aggressive intervention or workup  History renal insufficiency this appears relatively baseline with a creatinine of 2.29 on May 12 at this point will monitor it periodically intervals apparently she eats and drinks fairly well although somewhat sporadically at times.  Dementia with behaviors-again this appears to be baseline she is on Risperdal 0.5 mg twice a day she is followed by psychiatric servic she also continues on Xanax 0.5 milligrams twice a day for anxiety       [phone]

## 2016-01-30 DIAGNOSIS — F29 Unspecified psychosis not due to a substance or known physiological condition: Secondary | ICD-10-CM | POA: Diagnosis not present

## 2016-01-30 DIAGNOSIS — F411 Generalized anxiety disorder: Secondary | ICD-10-CM | POA: Diagnosis not present

## 2016-01-30 DIAGNOSIS — F0391 Unspecified dementia with behavioral disturbance: Secondary | ICD-10-CM | POA: Diagnosis not present

## 2016-02-26 ENCOUNTER — Non-Acute Institutional Stay (SKILLED_NURSING_FACILITY): Payer: Medicare Other | Admitting: Internal Medicine

## 2016-02-26 DIAGNOSIS — F0391 Unspecified dementia with behavioral disturbance: Secondary | ICD-10-CM

## 2016-02-26 DIAGNOSIS — R55 Syncope and collapse: Secondary | ICD-10-CM | POA: Diagnosis not present

## 2016-02-26 DIAGNOSIS — N184 Chronic kidney disease, stage 4 (severe): Secondary | ICD-10-CM | POA: Diagnosis not present

## 2016-02-26 NOTE — Progress Notes (Signed)
Patient ID: [name], female   DOB: 02-12-1924, 82 y.o.   MRN: [id number]        This is  routine visit.  Level care skilled.  Facility [facility].  Chief complaint--medical management of chronic medical conditions including dementia with behaviors-hypertension-chronic renal insufficiency-  History of present illness.  Patient is an 82 year old female with severe dementia-she was admitted to building after stay at the hospital for a fracture of the left femoral neck status post arthroplasty.  She continues to have a sitter with her throughout most of the day.   .  She is  seen by psychiatric services currently is on Risperdal 0.5 -mg BID-this appears to be helping with her behaviors generally- there are occasional outburst but apparently this has improved--- initially there was some concern about Risperdal causing oversedation but this does not appear to be the case this time . At one point she had been on an Exelon patch but apparently this was discontinued secondary to skin irritation with the patches  In regards to other issues she appears to be relatively stable she has baseline renal insufficiency with creatinine in the twos--was recently 2.29 on lab done 01/11/2016 we will update this   Apparently she is eating and drinking fairly well appears she's gained about 8 pounds once the beginning of the year-this has been stable the last month weight of 169     Patient does have a history what appears to be syncopal episodes were she'll becomes unresponsive for short period and then fairly quickly returned to her baseline-family does not desire  aggressive workup of this-she had another syncopal episode about 4 weeks' ago  she quickly returned to baseline Nursing does not report any issues today she allowed a limited exam today which is the baseline does have significant agitation at times especially with invasive maneuvers    .  Previous medical history.  Left hip  fracture status post repair.  Anemia.  Severe dementia.  Hypertension.  Chronic renal failure stage IV.  History of UTI.  Medications been reviewed per [facility] includes   aspirin 81 mg daily.  Colace.  Lopressor 12.5 mg twice a day.  Xanax 0.25 mg twice a day.  Risperdal 0.5 mg by mouth twice a day  Norco 7.5/325 milligrams every 4 hours when necessary  Aspirin 81 mg daily.  Colace 100 mg 3 times a day when necessary.    Family medical social history reviewed including progress note 01/22/2016  Review of systems-this is essentially impossible secondary to patient's severe dementia please see history of present illness   Physical exam. She is afebrile pulses 70 respirations 16 blood pressure difficult to obtain secondary patient agitation weight is stable at 169    In general this is an elderly female who actually is allowing a short exam today often she is agitated and that limits the exam--- she is resting in bed comfortably   Her skin is warm and dry---- does not appear to have increased warmth to touch--he does have numerous seborrheic keratosis  Eyes pupils appear reactive to light- -oropharynx from what I can tell is clear again this is limited since patient closes her mouth frequently.  Chest I do not note any congestion or increased respiratory effort or labored breathing-she does not really follow verbal commands but I could not really appreciate any congestion .  Heart is regular rate and rhythm without murmur gallop or rub she does not appear to have increased lower extremity edema from  baseline.  Her abdomen is soft does not appear to be tender bowel sounds are positive  GU  I could not really appreciate any overt suprapubic tenderness.  Musculoskeletal-moves all her studies at baseline again she has significant strength which is quite evident during her agitated episodes I do not see any different from baseline.  Neurologic again severe dementia  cranial nerves appear intact her strength certainly appears to be intact again no lateralizing findings.  Psych severe dementia but she actually did allow some examination today before becoming agitated  Labs.  01/11/2016.  Sodium 139 potassium 4.9 BUN 44 creatinine 2.29.  12/27/2015.  Cholesterol 166-triglycerides 39-HDL 44-LDL 114.  Hemoglobin A1c 5.4.  12/06/2015.  WBC 7.1 hemoglobin 10.9 platelets 190.  Albumin 3.3-AST 12 ALT 10  Sep 07 2015.  WBC 7.3 hemoglobin 10.2 platelets 179.  Hemoglobin A1c 5.5.     06/06/2015.  Sodium 140 potassium 4.3 BUN 51 creatinine 2.18.  Albumin 3.2-AST 13 ALT 9 otherwise liver function tests within normal limits.  WBC 6.9 hemoglobin 10.8 platelets 169  Hemoglobin A1c-5.6   03/14/2015.  Sodium 138 potassium 3.8 BUN 40 creatinine 2.4.  Albumin 3.0-AST 14-ALT 9 otherwise liver function tests within normal limits.  CBC 4.6 hemoglobin 10.2 platelets 144  01/27/2015.  Sodium 140 potassium 3.7 BUN 32 creatinine 2.84.  Albumen 3.3 otherwise liver function tests within normal limits.  WBC 7.1 hemoglobin 10.2 platelets 196.  01/18/2015.  TSH 3.769.  Assessment and plan.   .   1history of severe dementia-again she appears to be at baseline apparently the Risperdal is helping some I do not note any recent sedation concerns she is followed closely by psychiatric services she's continues on Xanax twice a day as well.  Recent hemoglobin A1c on 12/27/2015 was satisfactory at 5.4  #2 chronic kidney disease this appears to be relatively baseline with a creatinine of 2.29 she appears to run in the mid to higher 2 range again we'll see what update lab work tell us #3-anemia of chronic disease this appears to be relatively stable with a hemoglobin of 10.9 on lab done inApril 2017   #4 history of hypertension again we have limited reading secondary to patient agitation this in the past appears to have been well-controlled with  baseline systolics in the [id number] to [id number] occasional spikes thought to be agitation related   #5-history appear syncopal episodes-at this point will monitor apparently this has occurred on and off for years-- family does not wish aggressive workup she recovered quite unremarkably from the 1 that I followed up on late May       [id number]

## 2016-03-10 DIAGNOSIS — I739 Peripheral vascular disease, unspecified: Secondary | ICD-10-CM | POA: Diagnosis not present

## 2016-03-10 DIAGNOSIS — M79671 Pain in right foot: Secondary | ICD-10-CM | POA: Diagnosis not present

## 2016-03-10 DIAGNOSIS — M79672 Pain in left foot: Secondary | ICD-10-CM | POA: Diagnosis not present

## 2016-03-10 DIAGNOSIS — B351 Tinea unguium: Secondary | ICD-10-CM | POA: Diagnosis not present

## 2016-04-01 ENCOUNTER — Encounter: Payer: Self-pay | Admitting: Internal Medicine

## 2016-04-01 ENCOUNTER — Non-Acute Institutional Stay (SKILLED_NURSING_FACILITY): Payer: Medicare Other | Admitting: Internal Medicine

## 2016-04-01 DIAGNOSIS — I10 Essential (primary) hypertension: Secondary | ICD-10-CM

## 2016-04-01 DIAGNOSIS — F0391 Unspecified dementia with behavioral disturbance: Secondary | ICD-10-CM | POA: Diagnosis not present

## 2016-04-01 DIAGNOSIS — N184 Chronic kidney disease, stage 4 (severe): Secondary | ICD-10-CM | POA: Diagnosis not present

## 2016-04-01 DIAGNOSIS — R55 Syncope and collapse: Secondary | ICD-10-CM

## 2016-04-01 NOTE — Assessment & Plan Note (Signed)
Presently quiescent

## 2016-04-01 NOTE — Assessment & Plan Note (Signed)
Blood pressure adequately controlled No change indicated

## 2016-04-01 NOTE — Patient Instructions (Signed)
New orders for [employer] entry. BMET  TSH

## 2016-04-01 NOTE — Progress Notes (Signed)
    Facility Location: [place] Room Number: 106-D   This is a nursing facility follow up of chronic medical diagnoses  Interim medical record and care since last [place] visit was updated with review of diagnostic studies and change in clinical status since last visit were documented.   HPI: The patient is a long-term resident at [facility] and has diagnoses of dementia, chronic kidney disease, hyperglycemia and hypertension. Labs are not current. Her last creatinine was 2.29 on 01/11/16. Last TSH on record was 3.77 on 01/18/15. The patient is severely demented and can provide no history. Her sitter of 5 years states that the patient's anxiety is the biggest issue. She states that this has improved.  She states the patient will have syncope if she becomes constipated. This is not related to straining or vasovagal type process.  Physical exam:  Pertinent or positive findings: Patient has decreased hearing. When interview or examination is attempted she begins to babble incoherently. Heart sounds are distant. There is fusiform enlargement of the right ankle. Feet are cool w/o ischemic changes in the skin. Pedal pulses are decreased. She has a large keratosis over the left medial clavicle. General appearance:Adequately nourished; no acute distress , increased work of breathing is present.   Lymphatic: No lymphadenopathy about the head, neck, axilla . Eyes: No conjunctival inflammation or lid edema is present. There is no scleral icterus. Ears:  External ear exam shows no significant lesions or deformities.   Nose:  External nasal examination shows no deformity or inflammation. Nasal mucosa are pink and moist without lesions ,exudates Oral exam: lips and gums are healthy appearing.Patient would not allow exam , keeping lipsth clenced. Neck:  No thyromegaly, masses, tenderness noted.    Heart:  Normal rate and regular rhythm. S1 and S2 normal without gallop, murmur, click,  rub .  Lungs:Chest clear to auscultation without wheezes, rhonchi,rales , rubs. Abdomen:Bowel sounds are normal. Abdomen is soft and nontender with no organomegaly, hernias,masses. GU: deferred . Extremities:  No cyanosis, clubbing,edema  Neurologic exam : Strength equal  in upper & lower extremities Balance,Rhomberg,finger to nose testing could not be completed due to clinical state Skin: Warm & dry w/o tenting.Some hand bruising. No significant lesions or rash.    See summary under each active problem in the Problem List with associated updated therapeutic plan

## 2016-04-01 NOTE — Assessment & Plan Note (Signed)
Check BMET to assess present medication regimen and doses

## 2016-04-01 NOTE — Assessment & Plan Note (Signed)
Presently stable, no change indicated

## 2016-04-02 ENCOUNTER — Encounter (HOSPITAL_COMMUNITY)
Admission: RE | Admit: 2016-04-02 | Discharge: 2016-04-02 | Disposition: A | Discharge status: Home / Self Care | Payer: Medicare Other | Source: Skilled Nursing Facility | Attending: Internal Medicine | Admitting: Internal Medicine

## 2016-04-02 DIAGNOSIS — F0391 Unspecified dementia with behavioral disturbance: Secondary | ICD-10-CM | POA: Insufficient documentation

## 2016-04-02 DIAGNOSIS — F411 Generalized anxiety disorder: Secondary | ICD-10-CM | POA: Insufficient documentation

## 2016-04-02 DIAGNOSIS — D649 Anemia, unspecified: Secondary | ICD-10-CM | POA: Insufficient documentation

## 2016-04-02 LAB — BASIC METABOLIC PANEL
Anion gap: 6 (ref 5–15)
BUN: 41 mg/dL — ABNORMAL HIGH (ref 6–20)
CHLORIDE: 111 mmol/L (ref 101–111)
CO2: 24 mmol/L (ref 22–32)
CREATININE: 2.22 mg/dL — AB (ref 0.44–1.00)
Calcium: 8.6 mg/dL — ABNORMAL LOW (ref 8.9–10.3)
GFR, EST AFRICAN AMERICAN: 21 mL/min — AB (ref >60)
GFR, EST NON AFRICAN AMERICAN: 18 mL/min — AB (ref >60)
Glucose, Bld: 91 mg/dL (ref 65–99)
POTASSIUM: 4.2 mmol/L (ref 3.5–5.1)
SODIUM: 141 mmol/L (ref 135–145)

## 2016-04-02 LAB — TSH: TSH: 3.396 u[IU]/mL (ref 0.350–4.500)

## 2016-04-02 LAB — VITAMIN B12: Vitamin B-12: 286 pg/mL (ref 180–914)

## 2016-05-12 DIAGNOSIS — F29 Unspecified psychosis not due to a substance or known physiological condition: Secondary | ICD-10-CM | POA: Diagnosis not present

## 2016-05-12 DIAGNOSIS — F411 Generalized anxiety disorder: Secondary | ICD-10-CM | POA: Diagnosis not present

## 2016-05-12 DIAGNOSIS — F0391 Unspecified dementia with behavioral disturbance: Secondary | ICD-10-CM | POA: Diagnosis not present

## 2016-05-15 ENCOUNTER — Non-Acute Institutional Stay (SKILLED_NURSING_FACILITY): Payer: Medicare Other | Admitting: Internal Medicine

## 2016-05-15 DIAGNOSIS — R55 Syncope and collapse: Secondary | ICD-10-CM

## 2016-05-15 DIAGNOSIS — N184 Chronic kidney disease, stage 4 (severe): Secondary | ICD-10-CM

## 2016-05-15 DIAGNOSIS — F0391 Unspecified dementia with behavioral disturbance: Secondary | ICD-10-CM

## 2016-05-15 DIAGNOSIS — I10 Essential (primary) hypertension: Secondary | ICD-10-CM | POA: Diagnosis not present

## 2016-05-16 ENCOUNTER — Encounter (HOSPITAL_COMMUNITY)
Admission: RE | Admit: 2016-05-16 | Discharge: 2016-05-16 | Disposition: A | Discharge status: Home / Self Care | Payer: Medicare Other | Source: Skilled Nursing Facility | Attending: Internal Medicine | Admitting: Internal Medicine

## 2016-05-16 DIAGNOSIS — F411 Generalized anxiety disorder: Secondary | ICD-10-CM | POA: Diagnosis present

## 2016-05-16 DIAGNOSIS — F0391 Unspecified dementia with behavioral disturbance: Secondary | ICD-10-CM | POA: Insufficient documentation

## 2016-05-16 DIAGNOSIS — Z9181 History of falling: Secondary | ICD-10-CM | POA: Diagnosis present

## 2016-05-16 DIAGNOSIS — I129 Hypertensive chronic kidney disease with stage 1 through stage 4 chronic kidney disease, or unspecified chronic kidney disease: Secondary | ICD-10-CM | POA: Diagnosis present

## 2016-05-16 LAB — BASIC METABOLIC PANEL
ANION GAP: 7 (ref 5–15)
BUN: 36 mg/dL — ABNORMAL HIGH (ref 6–20)
CO2: 25 mmol/L (ref 22–32)
Calcium: 8.8 mg/dL — ABNORMAL LOW (ref 8.9–10.3)
Chloride: 109 mmol/L (ref 101–111)
Creatinine, Ser: 1.98 mg/dL — ABNORMAL HIGH (ref 0.44–1.00)
GFR calc Af Amer: 25 mL/min — ABNORMAL LOW (ref >60)
GFR, EST NON AFRICAN AMERICAN: 21 mL/min — AB (ref >60)
GLUCOSE: 92 mg/dL (ref 65–99)
POTASSIUM: 4.3 mmol/L (ref 3.5–5.1)
Sodium: 141 mmol/L (ref 135–145)

## 2016-05-16 LAB — CBC
HEMATOCRIT: 35.5 % — AB (ref 36.0–46.0)
Hemoglobin: 11.3 g/dL — ABNORMAL LOW (ref 12.0–15.0)
MCH: 28.7 pg (ref 26.0–34.0)
MCHC: 31.8 g/dL (ref 30.0–36.0)
MCV: 90.1 fL (ref 78.0–100.0)
PLATELETS: 190 10*3/uL (ref 150–400)
RBC: 3.94 MIL/uL (ref 3.87–5.11)
RDW: 13.4 % (ref 11.5–15.5)
WBC: 6.7 10*3/uL (ref 4.0–10.5)

## 2016-05-24 ENCOUNTER — Encounter: Payer: Self-pay | Admitting: Internal Medicine

## 2016-05-24 NOTE — Progress Notes (Signed)
This is a routine visit.  Level care skilled.  [place].  Chief complaint--medical management of chronic medical conditions including dementia with behaviors-hypertension-chronic renal insufficiency-  History of present illness.  Patient is an 82 year old female with severe dementia-she was admitted to building after stay at the hospital for a fracture of the left femoral neck status post arthroplasty.  She continues to have a sitter with her throughout most of the day.   .  She is  seen by psychiatric services currently is on Risperdal 0.5 -mg BID-this appears to be helping with her behaviors generally- there are occasional outburst but apparently this has improved--- initially there was some concern about Risperdal causing oversedation but this does not appear to be the case . At one point she had been on an Exelon patch but apparently this was discontinued secondary to skin irritation with the patches  In regards to other issues she appears to be relatively stable she has baseline renal insufficiency with creatinine in the twos--was recently 2.22 on lab done in early August 2017  Apparently she is eating and drinking fairly well appears she's gained about 10 pounds once the beginning of the year-this has been stable the last month weight of 171     Patient does have a history what appears to be syncopal episodes were she'll becomes unresponsive for short period and then fairly quickly returned to her baseline-family does not desire  aggressive workup of this- Nursing does not report any issues today she allowed a limited exam today which is the baseline does have significant agitation at times especially with invasive maneuvers    .  Previous medical history.  Left hip fracture status post repair.  Anemia.  Severe dementia.  Hypertension.  Chronic renal failure stage IV.  History of UTI.  Medications been reviewed per  [facility] includes   aspirin 81 mg daily.  Colace.  Lopressor 12.5 mg twice a day.  Xanax 0.25 mg twice a day.  Risperdal 0.5 mg by mouth twice a day  Norco 7.5/325 milligrams every 4 hours when necessary  Aspirin 81 mg daily.  Colace 100 mg 3 times a day when necessary.    Family medical social history reviewed including progress note 04/01/2016  Review of systems-this is essentially impossible secondary to patient's severe dementia please see history of present illness   Physical exam.  Temperature 98.2 pulse 60 respirations 20 blood pressure 137/67 her weight is stable at 171    In general this is an elderly female -- she is agitated and that limits the exam--- she is resting in bed comfortably   Her skin is warm and dry---- does not appear to have increased warmth to touch--he does have numerous seborrheic keratosis  Eyes pupils appear reactive to light- -oropharynx from what I can tell is clear again this is limited since patient closes her mouth frequently.  Chest I do not note any congestion or increased respiratory effort or labored breathing-she does not really follow verbal commands but I could not really appreciate any congestion .  Heart is regular rate and rhythm without murmur gallop or rub she does not appear to have increased lower extremity edema from baseline.  Her abdomen is soft does not appear to be tender bowel sounds are positive limited exam secondary patient agitation  GU  I could not really appreciate any overt suprapubic tenderness.  Musculoskeletal-moves all her studies at baseline again she has significant strength which is quite evident during her  agitated episodes I do not see any different from baseline.  Neurologic again severe dementia cranial nerves appear intact her strength certainly appears to be intact again no lateralizing findings.  Psych severe dementia but she actually did allow some examination today  before becoming agitated  Labs.  04/02/2016.  Sodium 141 potassium 4.2 BUN 41 creatinine 2.22.  TSH-3.396  01/11/2016.  Sodium 139 potassium 4.9 BUN 44 creatinine 2.29.  12/27/2015.  Cholesterol 166-triglycerides 39-HDL 44-LDL 114.  Hemoglobin A1c 5.4.  12/06/2015.  WBC 7.1 hemoglobin 10.9 platelets 190.  Albumin 3.3-AST 12 ALT 10  Sep 07 2015.  WBC 7.3 hemoglobin 10.2 platelets 179.  Hemoglobin A1c 5.5.     06/06/2015.  Sodium 140 potassium 4.3 BUN 51 creatinine 2.18.  Albumin 3.2-AST 13 ALT 9 otherwise liver function tests within normal limits.  WBC 6.9 hemoglobin 10.8 platelets 169  Hemoglobin A1c-5.6   03/14/2015.  Sodium 138 potassium 3.8 BUN 40 creatinine 2.4.  Albumin 3.0-AST 14-ALT 9 otherwise liver function tests within normal limits.  CBC 4.6 hemoglobin 10.2 platelets 144  01/27/2015.  Sodium 140 potassium 3.7 BUN 32 creatinine 2.84.  Albumen 3.3 otherwise liver function tests within normal limits.  WBC 7.1 hemoglobin 10.2 platelets 196.  01/18/2015.  TSH 3.769.  Assessment and plan.   .   1history of severe dementia-again she appears to be at baseline apparently the Risperdal is helping some I do not note any recent sedation concerns she is followed closely by psychiatric services she's continues on Xanax twice a day as well.  Recent hemoglobin A1c on 12/27/2015 was satisfactory at 5.4  #2 chronic kidney disease this appears to be relatively baseline with a creatinine of 2.22 she appears to run in the mid to higher 2 range again we'll see what update lab work tell us #3-anemia of chronic disease this appears to be relatively stable with a hemoglobin of 10.9 on lab done inApril 2017-we will attempt update this  if patient will lab lab draws   #4 history of hypertension again we have limited reading secondary to patient agitation this in the past appears to have been well-controlled with  baseline systolics in the 338S to [id number] occasional spikes thought to be agitation related blood pressures 137/67-130/57  #5-history appear syncopal episodes-at this point will monitor apparently this has occurred on and off for years-- family does not wish aggressive workup she recovered quite unremarkably from the 1 that I followed up on late May       [id number]

## 2016-05-29 DIAGNOSIS — M79662 Pain in left lower leg: Secondary | ICD-10-CM | POA: Diagnosis not present

## 2016-05-29 DIAGNOSIS — M79652 Pain in left thigh: Secondary | ICD-10-CM | POA: Diagnosis not present

## 2016-05-29 DIAGNOSIS — M25552 Pain in left hip: Secondary | ICD-10-CM | POA: Diagnosis not present

## 2016-05-29 DIAGNOSIS — M25562 Pain in left knee: Secondary | ICD-10-CM | POA: Diagnosis not present

## 2016-05-29 DIAGNOSIS — M25571 Pain in right ankle and joints of right foot: Secondary | ICD-10-CM | POA: Diagnosis not present

## 2016-06-06 ENCOUNTER — Non-Acute Institutional Stay (SKILLED_NURSING_FACILITY): Payer: Medicare Other | Admitting: Internal Medicine

## 2016-06-06 DIAGNOSIS — R55 Syncope and collapse: Secondary | ICD-10-CM | POA: Diagnosis not present

## 2016-06-07 NOTE — Progress Notes (Signed)
This is an acute visit.  Level care skilled.  Facility [facility].  Chief complaint-acute visit follow-up suspected syncopal episode.  History of present illness.  Patient is a 82 year old resident who is a long-term resident of this facility-she does have a history of severe dementia-in addition she does have a history of recurrent syncopal episodes with no clear etiology found.  Family desires conservative measures and no aggressive workup and no hospitalization.  Apparently this afternoon patient did have an episode where she was unresponsive for short period of time-and eventually slowly became responsive more alert and by the time I got in the room appeared to be nearing more her baseline eyes were opened she was communicating and somewhat agitated with exam which is her baseline.  O2 saturation initially was in the 80s but quickly rose into the high 90s on oxygen otherwise  Vital signs taken during the episode were stable CBG was 92.  I did check her a bit later and she was fully back at her baseline quite agitated with exam but talking alert.  Her family also is in the room with her and again has expressed desires for no aggressive interventions no labs no radiology studies.  They state that prior to this syncopal episode she appeared to be may be somewhat more confused and lethargic but they do not want again any aggressive lab work or a urinalysis and culture.  Currently she is resting in bed comfortably.  Previous medical history.  Left hip fracture status post repair.  Anemia.  Severe dementia.  Hypertension.  Chronic renal failure stage IV.  History of UTI.  Medications been reviewed per [facility] includes  aspirin 81 mg daily.  Colace.  Lopressor 12.5 mg twice a day.  Xanax 0.25 mg twice a day.  Risperdal 0.5 mg by mouth twice a day  Norco 7.5/325 milligrams every 4 hours when necessary  Aspirin 81 mg daily.  Colace 100 mg 3 times a  day when necessary.    Family medical social history reviewed including progress note 04/01/2016  Review of systems-this is essentially impossible secondary to patient's severe dementia please see history of present illness   Physical exam.  Temperature 99.1 pulse 88 respirations 20 blood pressure 130/90 O2 saturation initially was in the 80s and did rise up to 97% on oxygen CBG was 92    In general this is a frail elderly female in no distress issue when I evaluated her she was alert eyes were open she was talking some but appear to be somewhat lethargic-however on later examination she was fully back to baseline agitated with exam making eye contact talking smiling at times.  Her skin is warm and dry not diaphoretic.  Eyes pupils appear reactive light sclera and conjunctiva are clear visual acuity appears intact.  Chest is clear to auscultation with poor respiratory effort there is no labored breathing.  Heart is regular rate and rhythm with distant heart sounds.  Her abdomen soft does not appear to be acutely tender there is some reaction to the invasive maneuver but it does not appear to be true tenderness bowel sounds are active abdomen is not distended.  GU cannot really appreciate suprapubic tenderness although patient again is quite demented and difficult to fully tell.  Muscle skeletal is able to move all extremities 4 her strength appears to be intact in all extremities.  Neurologic she is alert and talking agitated somewhat with exam which is her baseline.  Psych-as noted abovr findings consistent with  severe dementia  Labs.  04/02/2016.  Sodium 141 potassium 4.2 BUN 41 creatinine 2.22.  TSH-3.396  01/11/2016.  Sodium 139 potassium 4.9 BUN 44 creatinine 2.29.  12/27/2015.  Cholesterol 166-triglycerides 39-HDL 44-LDL 114.  Hemoglobin A1c 5.4.  12/06/2015.  WBC 7.1 hemoglobin 10.9 platelets 190.  Albumin 3.3-AST 12 ALT 10  Sep 07 2015.  WBC 7.3 hemoglobin 10.2 platelets 179.  Hemoglobin A1c 5.5.     06/06/2015.  Sodium 140 potassium 4.3 BUN 51 creatinine 2.18.  Albumin 3.2-AST 13 ALT 9 otherwise liver function tests within normal limits.  WBC 6.9 hemoglobin 10.8 platelets 169  Hemoglobin A1c-5.6   03/14/2015.  Sodium 138 potassium 3.8 BUN 40 creatinine 2.4.  Albumin 3.0-AST 14-ALT 9 otherwise liver function tests within normal limits.  CBC 4.6 hemoglobin 10.2 platelets 144  01/27/2015.  Sodium 140 potassium 3.7 BUN 32 creatinine 2.84.  Albumen 3.3 otherwise liver function tests within normal limits.  WBC 7.1 hemoglobin 10.2 platelets 196.  01/18/2015.  TSH 3.769.  Assessment and plan.  #1 syncopal episode-patient appears to follow similar pattern or she is unresponsive for short period and then and fairly short order becomes back at her baseline alert responsive somewhat agitated with exam-this continues to be the case.  Currently she is back at her baseline per follow-up exam-I have discussed this with family at bedside and they do not want any aggressive interventions no labs no radiology studies at this point will monitor clinically but she appears to be herself here again which is reassuring-at this point will monitor.  [id number]

## 2016-06-12 ENCOUNTER — Encounter: Payer: Self-pay | Admitting: Internal Medicine

## 2016-06-12 ENCOUNTER — Non-Acute Institutional Stay (SKILLED_NURSING_FACILITY): Payer: Medicare Other | Admitting: Internal Medicine

## 2016-06-12 DIAGNOSIS — L03113 Cellulitis of right upper limb: Secondary | ICD-10-CM

## 2016-06-12 DIAGNOSIS — R55 Syncope and collapse: Secondary | ICD-10-CM

## 2016-06-12 NOTE — Progress Notes (Signed)
Location:   [facility] Room Number: 106/D Place of Service:  SNF (31) Provider:  [place] Lewis  No primary care provider on file.  Patient Care Team: Virgie Dad, MD as Consulting Physician (Geriatric Medicine)  Extended Emergency Contact Information Primary Emergency Contact: Srinika, [place], [place] [address] Johnnette Litter of [place] Phone: [phone] Mobile Phone: [phone] Relation: Daughter Secondary Emergency Contact: Stellarose, [place], [place] [address] [place] Phone: [phone] Relation: Son  Code Status:  DNR Goals of care: Advanced Directive information Advanced Directives 06/12/2016  Does patient have an advance directive? Yes  Type of Advance Directive Out of facility DNR (pink MOST or yellow form)  Does patient want to make changes to advanced directive? No - Patient declined  Copy of advanced directive(s) in chart? Yes  Pre-existing out of facility DNR order (yellow form or pink MOST form) -     Chief Complaint  Patient presents with  . Acute Visit    Pain and redness at site of Pneumonia shot   Follow-up syncope-hypoxia HPI:  Pt is a 82 y.o. female seen today for an acute visit for Some erythema of her right upper arm after receiving a pneumonia shot apparently yesterday.  Patient has severe dementia and cannot really give any review of systems with staff has noted apparently some complaints of tenderness here.  Vital signs at times are difficult to obtain secondary to dementia but apparently she's been afebrile she is sitting in her wheelchair comfortably today is somewhat cooperative with exam.  She was seen last week for an episode of syncope which she has on occasion she also had mild hypoxia-by the time I evaluated her she was returning back to her baseline-there's been no further reoccurrence of this which is her normal pattern. Family did not want any aggressive workup labs-chest x-ray  etc.  She is now sitting up in the wheelchair again appears comfortable no cough congestion noted nursing staff has not really noted other issues other than the erythema on her right arm     Past Medical History:  Diagnosis Date  . Alzheimer disease   . Arthritis   . Hyperlipemia   . Hypertension   . Kidney calculus    Past Surgical History:  Procedure Laterality Date  . CHOLECYSTECTOMY    . HIP ARTHROPLASTY Left 11/18/2013   Procedure: ARTHROPLASTY BIPOLAR HIP;  Surgeon: Sanjuana Kava, MD;  Location: [facility];  Service: Orthopedics;  Laterality: Left;    Allergies  Allergen Reactions  . Sulfa Antibiotics Rash    Current Outpatient Prescriptions on File Prior to Visit  Medication Sig Dispense Refill  . ALPRAZolam (XANAX) 0.25 MG tablet Take 0.25 mg by mouth 2 (two) times daily.    Marland Kitchen aspirin EC 81 MG tablet Take 81 mg by mouth daily.    . metoprolol tartrate (LOPRESSOR) 25 MG tablet Take 12.5 mg by mouth 2 (two) times daily.     . risperiDONE (RISPERDAL) 0.25 MG tablet Give 0.5 mg by mouth once in the morning. Give 0.5 mg by mouth at bedtime.    Marland Kitchen ZINC OXIDE EX Apply topically as needed (Apply to sacrum and buttocks for prevention).     No current facility-administered medications on file prior to visit.      Review of Systems  Essentially unattainable secondary to dementia however nursing has no some redness of her right  upper arm nursing says this appears a bit better today compared to yesterday-she is not complaining of acute pain does not appear to be in any distress  Immunization History  Administered Date(s) Administered  . Influenza-Unspecified 06/08/2014, 06/03/2016   Pertinent  Health Maintenance Due  Topic Date Due  . DEXA SCAN  06/08/1991  . PNA vac Low Risk Adult (1 of 2 - PCV13) 06/08/1991  . INFLUENZA VACCINE  Completed   No flowsheet data found. Functional Status Survey:     Pulses 56 respirations of 16 blood pressure and temperature are pending per  nursing she has been afebrile Physical Exam In general this is a frail elderly female in no distress is .  Her skin is warm and dry not diaphoretic. On the right upper arm there are patches of erythema this appears to be slightly tender slightly warm-I do not really appreciate any firmness or swelling  Eyes pupils appear reactive light sclera and conjunctiva are clear visual acuity appears intact.  Chest is clear to auscultation with poor respiratory effort there is no labored breathing.  Heart is regular rate and rhythm with distant heart sounds. She is slightly bradycardic in the mid 50s  Her abdomen soft does not appear to be acutely tender there is some reaction to the invasive maneuver but it does not appear to be true tenderness bowel sounds are active abdomen is not distended.   .  Muscle skeletal is able to move all extremities 4 her strength appears to be intact in all extremities.  Neurologic she is alert and talking agitated somewhat with exam which is her baseline.  Psych-as noted abovr findings consistent with dementia she has some mild agitation exam today but actually is a bit less so than usual  Labs reviewed:  Recent Labs  01/11/16 0740 04/02/16 0654 05/16/16 0720  NA 139 141 141  K 4.9 4.2 4.3  CL 111 111 109  CO2 23 24 25   GLUCOSE 87 91 92  BUN 44* 41* 36*  CREATININE 2.29* 2.22* 1.98*  CALCIUM 8.7* 8.6* 8.8*    Recent Labs  12/06/15 0710  AST 12*  ALT 10*  ALKPHOS 69  BILITOT 0.4  PROT 6.5  ALBUMIN 3.3*    Recent Labs  09/07/15 0630 12/06/15 0710 05/16/16 0720  WBC 7.3 7.1 6.7  NEUTROABS 4.1 3.7  --   HGB 10.2* 10.9* 11.3*  HCT 31.8* 33.6* 35.5*  MCV 89.8 88.9 90.1  PLT 179 190 190   Lab Results  Component Value Date   TSH 3.396 04/02/2016   Lab Results  Component Value Date   HGBA1C 5.4 12/06/2015   Lab Results  Component Value Date   CHOL 166 12/27/2015   HDL 44 12/27/2015   LDLCALC 114 (H) 12/27/2015   TRIG  39 12/27/2015   CHOLHDL 3.8 12/27/2015    Significant Diagnostic Results in last 30 days:  No results found.  Assessment/Plan #1-right arm at the erythema concerning possibly for beginning cellulitis  New problem---will start a short course of Keflex 500 mg twice a day for 5 days and monitor also will start a probiotic twice a day for 7 days.  Clinically she appears to be stable will try to obtain vital signs when patient will allow-but she does not appear to be in any distress actually appears to be doing fairly well.  In regards to his syncopal episodes is been no reoccurrence since I saw her last week she is back at her baseline eating and  drinking alert agitated at times-again family does not want aggressive workup of this and she does appear to be stable.  In regards hypoxia she does not really complain of shortness of breath lung exam was quite benign although she had poor respiratory effort family again did not want aggressive workup chest x-ray etc.  [place], [place], [place]

## 2016-06-17 ENCOUNTER — Encounter: Payer: Self-pay | Admitting: Internal Medicine

## 2016-06-17 NOTE — Progress Notes (Signed)
Location:   [facility] Room Number: 106/D Place of Service:  SNF (31) Provider:  Anjali,Gupta  No primary care provider on file.  Patient Care Team: Virgie Dad, MD as Consulting Physician (Geriatric Medicine)  Extended Emergency Contact Information Primary Emergency Contact: Yunique, [place], [place] [address] Johnnette Litter of [place] Phone: [phone] Mobile Phone: [phone] Relation: Daughter Secondary Emergency Contact: Shley, [place], [place] [address] [place] Phone: [phone] Relation: Son  Code Status:  DNR Goals of care: Advanced Directive information Advanced Directives 06/12/2016  Does patient have an advance directive? Yes  Type of Advance Directive Out of facility DNR (pink MOST or yellow form)  Does patient want to make changes to advanced directive? No - Patient declined  Copy of advanced directive(s) in chart? Yes  Pre-existing out of facility DNR order (yellow form or pink MOST form) -     Chief Complaint  Patient presents with  . Medical Management of Chronic Issues    Routine Visit    HPI:  Pt is a 82 y.o. female seen today for medical management of chronic diseases.     Past Medical History:  Diagnosis Date  . Alzheimer disease   . Arthritis   . Hyperlipemia   . Hypertension   . Kidney calculus    Past Surgical History:  Procedure Laterality Date  . CHOLECYSTECTOMY    . HIP ARTHROPLASTY Left 11/18/2013   Procedure: ARTHROPLASTY BIPOLAR HIP;  Surgeon: Sanjuana Kava, MD;  Location: [facility];  Service: Orthopedics;  Laterality: Left;    Allergies  Allergen Reactions  . Sulfa Antibiotics Rash    Current Outpatient Prescriptions on File Prior to Visit  Medication Sig Dispense Refill  . ALPRAZolam (XANAX) 0.25 MG tablet Take 0.25 mg by mouth 2 (two) times daily.    Marland Kitchen aspirin EC 81 MG tablet Take 81 mg by mouth daily.    . metoprolol tartrate (LOPRESSOR) 25 MG tablet Take 12.5  mg by mouth 2 (two) times daily.     . risperiDONE (RISPERDAL) 0.25 MG tablet Give 0.5 mg by mouth once in the morning. Give 0.5 mg by mouth at bedtime.    Marland Kitchen ZINC OXIDE EX Apply topically as needed (Apply to sacrum and buttocks for prevention).     No current facility-administered medications on file prior to visit.      Review of Systems  Immunization History  Administered Date(s) Administered  . Influenza-Unspecified 06/08/2014, 06/03/2016  . Pneumococcal-Unspecified 06/10/2016   Pertinent  Health Maintenance Due  Topic Date Due  . DEXA SCAN  06/17/2017 (Originally 06/08/1991)  . PNA vac Low Risk Adult (2 of 2 - PCV13) 06/10/2017  . INFLUENZA VACCINE  Completed   No flowsheet data found. Functional Status Survey:    Vitals:   06/15/16 0910  BP: 102/80  Pulse: 74  Resp: 19  Temp: 98.1 F (36.7 C)  TempSrc: Oral  SpO2: 98%   There is no height or weight on file to calculate BMI. Physical Exam  Labs reviewed:  Recent Labs  01/11/16 0740 04/02/16 0654 05/16/16 0720  NA 139 141 141  K 4.9 4.2 4.3  CL 111 111 109  CO2 23 24 25   GLUCOSE 87 91 92  BUN 44* 41* 36*  CREATININE 2.29* 2.22* 1.98*  CALCIUM 8.7* 8.6* 8.8*    Recent Labs  12/06/15 0710  AST 12*  ALT 10*  ALKPHOS 69  BILITOT 0.4  PROT 6.5  ALBUMIN 3.3*    Recent Labs  09/07/15 0630 12/06/15 0710 05/16/16 0720  WBC 7.3 7.1 6.7  NEUTROABS 4.1 3.7  --   HGB 10.2* 10.9* 11.3*  HCT 31.8* 33.6* 35.5*  MCV 89.8 88.9 90.1  PLT 179 190 190   Lab Results  Component Value Date   TSH 3.396 04/02/2016   Lab Results  Component Value Date   HGBA1C 5.4 12/06/2015   Lab Results  Component Value Date   CHOL 166 12/27/2015   HDL 44 12/27/2015   LDLCALC 114 (H) 12/27/2015   TRIG 39 12/27/2015   CHOLHDL 3.8 12/27/2015    Significant Diagnostic Results in last 30 days:  No results found.  Assessment/Plan There are no diagnoses linked to this encounter.   Family/ staff Communication:    Labs/tests ordered:       This encounter was created in error - please disregard.

## 2016-06-19 ENCOUNTER — Non-Acute Institutional Stay (SKILLED_NURSING_FACILITY): Payer: Medicare Other | Admitting: Internal Medicine

## 2016-06-19 ENCOUNTER — Encounter: Payer: Self-pay | Admitting: Internal Medicine

## 2016-06-19 DIAGNOSIS — I10 Essential (primary) hypertension: Secondary | ICD-10-CM | POA: Diagnosis not present

## 2016-06-19 DIAGNOSIS — F0391 Unspecified dementia with behavioral disturbance: Secondary | ICD-10-CM

## 2016-06-19 DIAGNOSIS — N184 Chronic kidney disease, stage 4 (severe): Secondary | ICD-10-CM

## 2016-06-19 DIAGNOSIS — R55 Syncope and collapse: Secondary | ICD-10-CM

## 2016-06-19 NOTE — Progress Notes (Signed)
Location:   [facility] Room Number: 106/D Place of Service:  SNF (31) Provider:  Fleming Prill,Zac Torti  No primary care provider on file.  Patient Care Team: Virgie Dad, MD as Consulting Physician (Geriatric Medicine)  Extended Emergency Contact Information Primary Emergency Contact: Tanesha, [place], [place] [address] Johnnette Litter of [place] Phone: [phone] Mobile Phone: [phone] Relation: Daughter Secondary Emergency Contact: Wendi, [place], [place] [address] [place] of [place] Phone: [phone] Relation: Son  Code Status:  DNR Goals of care: Advanced Directive information Advanced Directives 06/19/2016  Does patient have an advance directive? Yes  Type of Advance Directive Out of facility DNR (pink MOST or yellow form)  Does patient want to make changes to advanced directive? No - Patient declined  Copy of advanced directive(s) in chart? Yes  Pre-existing out of facility DNR order (yellow form or pink MOST form) -     Chief Complaint  Patient presents with  . Medical Management of Chronic Issues    Routine Visit    HPI:  Pt is a 82 y.o. female seen today for medical management of chronic diseases.  Patient has been long term resident of facility. She has h/o Hypertension Chronic renal disease , h/o femur fracture and Dementia with behavior disturbances. Patient is unable to give me any history but according to nurses she is doing well and stable. She did have one episode of Syncope but as family wishes no aggressive work up needed.  Her behavior is stable follows up with Psych.   Past Medical History:  Diagnosis Date  . Alzheimer disease   . Arthritis   . Hyperlipemia   . Hypertension   . Kidney calculus    Past Surgical History:  Procedure Laterality Date  . CHOLECYSTECTOMY    . HIP ARTHROPLASTY Left 11/18/2013   Procedure: ARTHROPLASTY BIPOLAR HIP;  Surgeon: Sanjuana Kava, MD;  Location: [facility];  Service:  Orthopedics;  Laterality: Left;    Allergies  Allergen Reactions  . Sulfa Antibiotics Rash   Current Outpatient Prescriptions on File Prior to Visit  Medication Sig Dispense Refill  . ALPRAZolam (XANAX) 0.25 MG tablet Take 0.25 mg by mouth 2 (two) times daily.    Marland Kitchen aspirin EC 81 MG tablet Take 81 mg by mouth daily.    . metoprolol tartrate (LOPRESSOR) 25 MG tablet Take 12.5 mg by mouth 2 (two) times daily.     . Probiotic Product (RISA-BID PROBIOTIC) TABS Give 1 tablet by mouth twice a day until 06/25/16.    Marland Kitchen risperiDONE (RISPERDAL) 0.25 MG tablet Give 0.5 mg by mouth once in the morning. Give 0.5 mg by mouth at bedtime.    Marland Kitchen ZINC OXIDE EX Apply topically as needed (Apply to sacrum and buttocks for prevention).     No current facility-administered medications on file prior to visit.     Review of Systems  Unable to perform ROS: Dementia    Immunization History  Administered Date(s) Administered  . Influenza-Unspecified 06/08/2014, 06/03/2016  . Pneumococcal-Unspecified 06/10/2016   Pertinent  Health Maintenance Due  Topic Date Due  . DEXA SCAN  06/17/2017 (Originally 06[birth year]08/1991)  . PNA vac Low Risk Adult (2 of 2 - PCV13) 06/10/2017  . INFLUENZA VACCINE  Completed   No flowsheet data found. Functional Status Survey:    There were no vitals filed for this visit. There is no height or weight on  file to calculate BMI. Physical Exam  Constitutional: She appears well-developed and well-nourished.  HENT:  Mouth/Throat: Oropharynx is clear and moist.  Cardiovascular: Normal rate, regular rhythm and normal heart sounds.   Pulmonary/Chest: Effort normal and breath sounds normal. No respiratory distress. She has no wheezes. She has no rales. She exhibits no tenderness.  Abdominal: Soft. Bowel sounds are normal. She exhibits no distension and no mass. There is no tenderness. There is no rebound and no guarding.  Musculoskeletal: She exhibits no edema.  Neurological: She is alert.    Not oriented to time place and person.  Skin: Skin is warm. No rash noted. No erythema.    Labs reviewed:  Recent Labs  01/11/16 0740 04/02/16 0654 05/16/16 0720  NA 139 141 141  K 4.9 4.2 4.3  CL 111 111 109  CO2 23 24 25   GLUCOSE 87 91 92  BUN 44* 41* 36*  CREATININE 2.29* 2.22* 1.98*  CALCIUM 8.7* 8.6* 8.8*    Recent Labs  12/06/15 0710  AST 12*  ALT 10*  ALKPHOS 69  BILITOT 0.4  PROT 6.5  ALBUMIN 3.3*    Recent Labs  09/07/15 0630 12/06/15 0710 05/16/16 0720  WBC 7.3 7.1 6.7  NEUTROABS 4.1 3.7  --   HGB 10.2* 10.9* 11.3*  HCT 31.8* 33.6* 35.5*  MCV 89.8 88.9 90.1  PLT 179 190 190   Lab Results  Component Value Date   TSH 3.396 04/02/2016   Lab Results  Component Value Date   HGBA1C 5.4 12/06/2015   Lab Results  Component Value Date   CHOL 166 12/27/2015   HDL 44 12/27/2015   LDLCALC 114 (H) 12/27/2015   TRIG 39 12/27/2015   CHOLHDL 3.8 12/27/2015    Significant Diagnostic Results in last 30 days:  No results found.  Assessment/Plan CKD (chronic kidney disease), stage IV   Last creat was 1.98. Which is close to her baseline. Continue to monitor. Repeat BMP next visit.  Essential hypertension BP has been stable. Continue metoprolol  Syncope No w/u as per family wishes.  Dementia with behavioral disturbance Stable on Xanax and risperidone  H/O femur fracture  Will start her on Vit D  And calcium supplement.     Family/ staff Communication:   Labs/tests ordered:

## 2016-07-11 ENCOUNTER — Non-Acute Institutional Stay (SKILLED_NURSING_FACILITY): Payer: Medicare Other | Admitting: Internal Medicine

## 2016-07-11 ENCOUNTER — Encounter: Payer: Self-pay | Admitting: Internal Medicine

## 2016-07-11 DIAGNOSIS — F0391 Unspecified dementia with behavioral disturbance: Secondary | ICD-10-CM

## 2016-07-11 DIAGNOSIS — I10 Essential (primary) hypertension: Secondary | ICD-10-CM | POA: Diagnosis not present

## 2016-07-11 DIAGNOSIS — N184 Chronic kidney disease, stage 4 (severe): Secondary | ICD-10-CM

## 2016-07-11 DIAGNOSIS — R55 Syncope and collapse: Secondary | ICD-10-CM

## 2016-07-11 NOTE — Progress Notes (Signed)
Location:   [facility] Room Number: 106/D Place of Service:  SNF (31) Provider:  Felecia Stanfill,[facility]  No primary care provider on file.  Patient Care Team: Virgie Dad, MD as Consulting Physician (Geriatric Medicine)  Extended Emergency Contact Information Primary Emergency Contact: Kapri, [place], [place] [address] Johnnette Litter of [place] Phone: [phone] Mobile Phone: [phone] Relation: Daughter Secondary Emergency Contact: Danylah, [place], [place] [address] [place] of [place] Phone: [phone] Relation: Son  Code Status:  DNR Goals of care: Advanced Directive information Advanced Directives 07/11/2016  Does patient have an advance directive? Yes  Type of Advance Directive Out of facility DNR (pink MOST or yellow form)  Does patient want to make changes to advanced directive? No - Patient declined  Copy of advanced directive(s) in chart? Yes  Pre-existing out of facility DNR order (yellow form or pink MOST form) -     Chief Complaint  Patient presents with  . Medical Management of Chronic Issues    Routine Visit  For medical management of chronic medical conditions including hypertension-chronic renal disease dementia with behaviors-history of femur fracture  HPI:  Pt is a 82 y.o. female seen today for medical management of chronic diseases. As noted above-she continues to be stable.  She has hives does have syncopal episodes but family does not wish aggressive intervention or workup these are usually of short duration and she quickly returned to her baseline usually.  Nursing staff does not report any concerns today her vital signs are stable she does have significant dementia  Appear she has gained about 10 pounds since the summer she apparently is eating quite well  Past Medical History:  Diagnosis Date  . Alzheimer disease   . Arthritis   . Hyperlipemia   . Hypertension   . Kidney calculus    Past Surgical  History:  Procedure Laterality Date  . CHOLECYSTECTOMY    . HIP ARTHROPLASTY Left 11/18/2013   Procedure: ARTHROPLASTY BIPOLAR HIP;  Surgeon: Sanjuana Kava, MD;  Location: [facility];  Service: Orthopedics;  Laterality: Left;    Allergies  Allergen Reactions  . Sulfa Antibiotics Rash    Current Outpatient Prescriptions on File Prior to Visit  Medication Sig Dispense Refill  . ALPRAZolam (XANAX) 0.25 MG tablet Take 0.25 mg by mouth 2 (two) times daily.    Marland Kitchen aspirin EC 81 MG tablet Take 81 mg by mouth daily.    . metoprolol tartrate (LOPRESSOR) 25 MG tablet Take 12.5 mg by mouth 2 (two) times daily.     . risperiDONE (RISPERDAL) 0.25 MG tablet Give 0.5 mg by mouth once in the morning. Give 0.5 mg by mouth at bedtime.    Marland Kitchen ZINC OXIDE EX Apply topically as needed (Apply to sacrum and buttocks for prevention).     No current facility-administered medications on file prior to visit.      Review of Systems   Essentially unattainable secondary to dementia please see history of present illness  Immunization History  Administered Date(s) Administered  . Influenza-Unspecified 06/08/2014, 06/03/2016  . Pneumococcal-Unspecified 06/10/2016   Pertinent  Health Maintenance Due  Topic Date Due  . DEXA SCAN  06/17/2017 (Originally 06[birth year]08/1991)  . PNA vac Low Risk Adult (2 of 2 - PCV13) 06/10/2017  . INFLUENZA VACCINE  Completed   No flowsheet data found. Functional Status Survey:    Vitals:   07/11/16 1057  BP: (!) 119/59  Pulse: 67  Resp: 19  Temp: 98.3 F (36.8 C)  TempSrc: Oral  SpO2: 98%  Weight is 179 pounds There is no height or weight on file to calculate BMI. Physical Exam   In general this is a frail elderly female in no distress sitting in her wheelchair .  Her skin is warm and dry not diaphoretic g  Eyes pupils appear reactive light sclera and conjunctiva are clear visual acuity appears intact. She has prescription lenses  Chest is clear to auscultation with poor  respiratory effort there is no labored breathing.  Heart is regular rate and rhythm with distant heart sounds. heart rate is 60  Her abdomen soft does not appear to be acutely tender   bowel sounds are active abdomen is not distended.   .  Muscle skeletal is able to move all extremities 4 her strength appears to be intact in all extremities.  Neurologic she is alert and talking somewhat stoic appearing today is not agitated her daughter is at bedside actually helping with the exam and I suspect this may be helping with her agitation  Psych-as noted abovrfindings consistent with dementia     Labs reviewed:  Recent Labs  01/11/16 0740 04/02/16 0654 05/16/16 0720  NA 139 141 141  K 4.9 4.2 4.3  CL 111 111 109  CO2 23 24 25   GLUCOSE 87 91 92  BUN 44* 41* 36*  CREATININE 2.29* 2.22* 1.98*  CALCIUM 8.7* 8.6* 8.8*    Recent Labs  12/06/15 0710  AST 12*  ALT 10*  ALKPHOS 69  BILITOT 0.4  PROT 6.5  ALBUMIN 3.3*    Recent Labs  09/07/15 0630 12/06/15 0710 05/16/16 0720  WBC 7.3 7.1 6.7  NEUTROABS 4.1 3.7  --   HGB 10.2* 10.9* 11.3*  HCT 31.8* 33.6* 35.5*  MCV 89.8 88.9 90.1  PLT 179 190 190   Lab Results  Component Value Date   TSH 3.396 04/02/2016   Lab Results  Component Value Date   HGBA1C 5.4 12/06/2015   Lab Results  Component Value Date   CHOL 166 12/27/2015   HDL 44 12/27/2015   LDLCALC 114 (H) 12/27/2015   TRIG 39 12/27/2015   CHOLHDL 3.8 12/27/2015    Significant Diagnostic Results in last 30 days:  No results found.  Assessment/Plan     1history of severe dementia-again she appears to be at baseline apparently the Risperdal  BID is helping some I do not note any recent sedation concerns she is followed closely by psychiatric services she's continues on Xanax twice a day as well.  Recent hemoglobin A1c on 12/27/2015 was satisfactory at 5.4--will update this secondary to history of high-risk meds  #2 chronic kidney disease  this appears to be relatively baseline with a creatinine of 1.98 on lab done on September 15-we will update this as well   #3-anemia of chronic disease this appears to be relatively stable with a hemoglobin of 11.3 since we are obtaining lab work will obtain this as well to try to minimize frequency of lab draws   #4 history of hypertension again we have limited reading secondary to patient agitation this in the past appears to have been well-controlled--continues on metoprolol 12.5 mg twice a day   #5-history appear syncopal episodes-at this point will monitor apparently this has occurred on and off for years-- family does not wish aggressive workup   #6 History femur fracture she's was started on vitamin D and  calcium supplement      [id number]

## 2016-07-14 ENCOUNTER — Encounter (HOSPITAL_COMMUNITY)
Admission: RE | Admit: 2016-07-14 | Discharge: 2016-07-14 | Disposition: A | Discharge status: Home / Self Care | Payer: Medicare Other | Source: Skilled Nursing Facility | Attending: Internal Medicine | Admitting: Internal Medicine

## 2016-07-14 DIAGNOSIS — F0391 Unspecified dementia with behavioral disturbance: Secondary | ICD-10-CM | POA: Diagnosis present

## 2016-07-14 DIAGNOSIS — D649 Anemia, unspecified: Secondary | ICD-10-CM | POA: Diagnosis present

## 2016-07-14 DIAGNOSIS — F411 Generalized anxiety disorder: Secondary | ICD-10-CM | POA: Insufficient documentation

## 2016-07-14 LAB — CBC
HCT: 33.5 % — ABNORMAL LOW (ref 36.0–46.0)
Hemoglobin: 10.7 g/dL — ABNORMAL LOW (ref 12.0–15.0)
MCH: 29.2 pg (ref 26.0–34.0)
MCHC: 31.9 g/dL (ref 30.0–36.0)
MCV: 91.5 fL (ref 78.0–100.0)
PLATELETS: 192 10*3/uL (ref 150–400)
RBC: 3.66 MIL/uL — AB (ref 3.87–5.11)
RDW: 13.5 % (ref 11.5–15.5)
WBC: 7.3 10*3/uL (ref 4.0–10.5)

## 2016-07-14 LAB — BASIC METABOLIC PANEL
ANION GAP: 7 (ref 5–15)
BUN: 49 mg/dL — AB (ref 6–20)
CO2: 25 mmol/L (ref 22–32)
Calcium: 8.4 mg/dL — ABNORMAL LOW (ref 8.9–10.3)
Chloride: 110 mmol/L (ref 101–111)
Creatinine, Ser: 2.19 mg/dL — ABNORMAL HIGH (ref 0.44–1.00)
GFR calc Af Amer: 22 mL/min — ABNORMAL LOW (ref >60)
GFR, EST NON AFRICAN AMERICAN: 19 mL/min — AB (ref >60)
Glucose, Bld: 133 mg/dL — ABNORMAL HIGH (ref 65–99)
POTASSIUM: 4.2 mmol/L (ref 3.5–5.1)
SODIUM: 142 mmol/L (ref 135–145)

## 2016-07-15 LAB — HEMOGLOBIN A1C
Hgb A1c MFr Bld: 5.2 % (ref 4.8–5.6)
MEAN PLASMA GLUCOSE: 103 mg/dL

## 2016-07-28 DIAGNOSIS — F411 Generalized anxiety disorder: Secondary | ICD-10-CM | POA: Diagnosis not present

## 2016-07-28 DIAGNOSIS — F0391 Unspecified dementia with behavioral disturbance: Secondary | ICD-10-CM | POA: Diagnosis not present

## 2016-07-28 DIAGNOSIS — F29 Unspecified psychosis not due to a substance or known physiological condition: Secondary | ICD-10-CM | POA: Diagnosis not present

## 2016-08-01 ENCOUNTER — Non-Acute Institutional Stay (SKILLED_NURSING_FACILITY): Payer: Medicare Other | Admitting: Internal Medicine

## 2016-08-01 DIAGNOSIS — N184 Chronic kidney disease, stage 4 (severe): Secondary | ICD-10-CM | POA: Diagnosis not present

## 2016-08-01 DIAGNOSIS — R05 Cough: Secondary | ICD-10-CM

## 2016-08-01 DIAGNOSIS — R059 Cough, unspecified: Secondary | ICD-10-CM

## 2016-08-01 NOTE — Progress Notes (Signed)
This is an acute visit.  Level care skilled.  Facility is [facility].  Chief complaint-acute visit secondary to cough.  History of present illness.  Patient is a 82 year old female who is a long-term resident of this facility-she apparently developed a cough this afternoon of questionable productivity-vital signs appear to be stable she is afebrile I am following up on this.  She has severe dementia and cannot really give any review of systems Past Medical History:  Diagnosis Date  . Alzheimer disease   . Arthritis   . Hyperlipemia   . Hypertension   . Kidney calculus         Past Surgical History:  Procedure Laterality Date  . CHOLECYSTECTOMY    . HIP ARTHROPLASTY Left 11/18/2013   Procedure: ARTHROPLASTY BIPOLAR HIP;  Surgeon: Sanjuana Kava, MD;  Location: [facility];  Service: Orthopedics;  Laterality: Left;        Allergies  Allergen Reactions  . Sulfa Antibiotics Rash          Current Outpatient Prescriptions on File Prior to Visit  Medication Sig Dispense Refill  . ALPRAZolam (XANAX) 0.25 MG tablet Take 0.25 mg by mouth 2 (two) times daily.    Marland Kitchen aspirin EC 81 MG tablet Take 81 mg by mouth daily.    . metoprolol tartrate (LOPRESSOR) 25 MG tablet Take 12.5 mg by mouth 2 (two) times daily.     . risperiDONE (RISPERDAL) 0.25 MG tablet Give 0.5 mg by mouth once in the morning. Give 0.5 mg by mouth at bedtime.    Marland Kitchen ZINC OXIDE EX Apply topically as needed (Apply to sacrum and buttocks for prevention).     No current facility-administered medications on file prior to visit.      Review of Systems   Essentially unattainable secondary to dementia please see history of present illness      Immunization History  Administered Date(s) Administered  . Influenza-Unspecified 06/08/2014, 06/03/2016  . Pneumococcal-Unspecified 06/10/2016       Pertinent  Health Maintenance Due  Topic Date Due  . DEXA SCAN  06/17/2017 (Originally  06[birth year]08/1991)  . PNA vac Low Risk Adult (2 of 2 - PCV13) 06/10/2017  . INFLUENZA VACCINE  Completed   No flowsheet data found. Functional Status Survey:   Temperature is 98.0 pulse 67 respirations 20 blood pressure 102/43.--Oxygen saturation is 100% on room air Physical Exam   In general this is a frail elderly female in no distress lying comfortably in bed she is currently being fed dinner .  Her skin is warm and dry not diaphoretic g  Eyes pupils appear reactive light sclera and conjunctiva are clear visual acuity appears intact. She has prescription lenses Do not really see any drainage  Oropharynx appears to be clear although she did not open her mouth very wide.  Neck could not really appreciate any adenopathy  Chest is clear to auscultation with poor respiratory effort there is no labored breathing.  Heart is regular rate and rhythm with distant heart sounds.  Her abdomen soft does not appear to be acutely tender   bowel sounds are active abdomen is not distended.   .  Muscle skeletal is able to move all extremities 4 her strength appears to be intact in all extremities.  Neurologic she is alert and talking    Psych--has significant dementia is not overtly agitated tonight     Labs reviewed:  07/14/2016.  Hemoglobin A1c 5.2.  Sodium 142 potassium 4.2 BUN 49 creatinine 2.19.  WBC 7.3 hemoglobin 10.7 platelets 192  Recent Labs (within last 365 days)   Recent Labs  01/11/16 0740 04/02/16 0654 05/16/16 0720  NA 139 141 141  K 4.9 4.2 4.3  CL 111 111 109  CO2 23 24 25   GLUCOSE 87 91 92  BUN 44* 41* 36*  CREATININE 2.29* 2.22* 1.98*  CALCIUM 8.7* 8.6* 8.8*      Recent Labs (within last 365 days)   Recent Labs  12/06/15 0710  AST 12*  ALT 10*  ALKPHOS 69  BILITOT 0.4  PROT 6.5  ALBUMIN 3.3*      Recent Labs (within last 365 days)   Recent Labs  09/07/15 0630 12/06/15 0710 05/16/16 0720  WBC 7.3 7.1 6.7    NEUTROABS 4.1 3.7  --   HGB 10.2* 10.9* 11.3*  HCT 31.8* 33.6* 35.5*  MCV 89.8 88.9 90.1  PLT 179 190 190     Recent Labs       Lab Results  Component Value Date   TSH 3.396 04/02/2016     Recent Labs       Lab Results  Component Value Date   HGBA1C 5.4 12/06/2015     Recent Labs       Lab Results  Component Value Date   CHOL 166 12/27/2015   HDL 44 12/27/2015   LDLCALC 114 (H) 12/27/2015   TRIG 39 12/27/2015   CHOLHDL 3.8 12/27/2015      Significant Diagnostic Results in last 30 days:  Imaging Results   Assessment and plan.  #1 history of cough-chest appears to be clear she does not appear to be having any distress or significant discomfort-will make Robitussin-DM routine every 6 hours for 72 hours and monitor closely with vital signs pulse ox every shift for 72 hours-notify provider of any increased cough congestion  #2 renal insufficiency this appears relatively baseline with creatinine of 2.19 BUN 49 on most recent lab will monitor at periodic intervals-at times obtaining blood work from patient is a challenge secondary to her dementia-  [id number]

## 2016-08-02 DIAGNOSIS — R0989 Other specified symptoms and signs involving the circulatory and respiratory systems: Secondary | ICD-10-CM | POA: Diagnosis not present

## 2016-08-02 DIAGNOSIS — R05 Cough: Secondary | ICD-10-CM | POA: Diagnosis not present

## 2016-08-03 ENCOUNTER — Encounter (HOSPITAL_COMMUNITY): Payer: Self-pay | Admitting: Emergency Medicine

## 2016-08-03 ENCOUNTER — Emergency Department (HOSPITAL_COMMUNITY): Payer: Medicare Other

## 2016-08-03 ENCOUNTER — Emergency Department (HOSPITAL_COMMUNITY)
Admission: EM | Admit: 2016-08-03 | Discharge: 2016-08-04 | Disposition: A | Discharge status: Home / Self Care | Payer: Medicare Other | Attending: Emergency Medicine | Admitting: Emergency Medicine

## 2016-08-03 DIAGNOSIS — Z7982 Long term (current) use of aspirin: Secondary | ICD-10-CM | POA: Insufficient documentation

## 2016-08-03 DIAGNOSIS — M25552 Pain in left hip: Secondary | ICD-10-CM | POA: Insufficient documentation

## 2016-08-03 DIAGNOSIS — N184 Chronic kidney disease, stage 4 (severe): Secondary | ICD-10-CM | POA: Diagnosis not present

## 2016-08-03 DIAGNOSIS — Z79899 Other long term (current) drug therapy: Secondary | ICD-10-CM | POA: Diagnosis not present

## 2016-08-03 DIAGNOSIS — Y999 Unspecified external cause status: Secondary | ICD-10-CM | POA: Diagnosis not present

## 2016-08-03 DIAGNOSIS — G309 Alzheimer's disease, unspecified: Secondary | ICD-10-CM | POA: Diagnosis not present

## 2016-08-03 DIAGNOSIS — Y929 Unspecified place or not applicable: Secondary | ICD-10-CM | POA: Insufficient documentation

## 2016-08-03 DIAGNOSIS — W19XXXA Unspecified fall, initial encounter: Secondary | ICD-10-CM

## 2016-08-03 DIAGNOSIS — W1839XA Other fall on same level, initial encounter: Secondary | ICD-10-CM | POA: Diagnosis not present

## 2016-08-03 DIAGNOSIS — Y939 Activity, unspecified: Secondary | ICD-10-CM | POA: Insufficient documentation

## 2016-08-03 DIAGNOSIS — S40012A Contusion of left shoulder, initial encounter: Secondary | ICD-10-CM | POA: Diagnosis not present

## 2016-08-03 DIAGNOSIS — I129 Hypertensive chronic kidney disease with stage 1 through stage 4 chronic kidney disease, or unspecified chronic kidney disease: Secondary | ICD-10-CM | POA: Diagnosis not present

## 2016-08-03 DIAGNOSIS — S4992XA Unspecified injury of left shoulder and upper arm, initial encounter: Secondary | ICD-10-CM | POA: Diagnosis present

## 2016-08-03 DIAGNOSIS — M79605 Pain in left leg: Secondary | ICD-10-CM | POA: Diagnosis not present

## 2016-08-03 DIAGNOSIS — M25512 Pain in left shoulder: Secondary | ICD-10-CM | POA: Diagnosis not present

## 2016-08-03 DIAGNOSIS — S199XXA Unspecified injury of neck, initial encounter: Secondary | ICD-10-CM | POA: Diagnosis not present

## 2016-08-03 DIAGNOSIS — R531 Weakness: Secondary | ICD-10-CM | POA: Diagnosis not present

## 2016-08-03 DIAGNOSIS — I6789 Other cerebrovascular disease: Secondary | ICD-10-CM | POA: Diagnosis not present

## 2016-08-03 DIAGNOSIS — S79912A Unspecified injury of left hip, initial encounter: Secondary | ICD-10-CM | POA: Diagnosis not present

## 2016-08-03 DIAGNOSIS — S0990XA Unspecified injury of head, initial encounter: Secondary | ICD-10-CM | POA: Diagnosis not present

## 2016-08-03 HISTORY — DX: Anemia, unspecified: D64.9

## 2016-08-03 HISTORY — DX: Anxiety disorder, unspecified: F41.9

## 2016-08-03 HISTORY — DX: Pneumonia, unspecified organism: J18.9

## 2016-08-03 LAB — CBG MONITORING, ED: Glucose-Capillary: 96 mg/dL (ref 65–99)

## 2016-08-03 MED ORDER — FENTANYL CITRATE (PF) 100 MCG/2ML IJ SOLN
50.0000 ug | INTRAMUSCULAR | Status: DC | PRN
  Administered 2016-08-03: 50 ug via INTRAVENOUS

## 2016-08-03 MED ORDER — FENTANYL CITRATE (PF) 100 MCG/2ML IJ SOLN
50.0000 ug | INTRAMUSCULAR | Status: DC | PRN
  Filled 2016-08-03: qty 2

## 2016-08-03 NOTE — ED Triage Notes (Signed)
Pt from [facility] was being transported in stand up lift and fell. Shortening of L leg, deformity of shoulder. Pt has broken tooth and blood in mouth. Per nursing home pt was started on antibx for UTI yesterday and has been combative.

## 2016-08-03 NOTE — ED Notes (Signed)
Per nursing home pt had a brief LOC, unsure if it was prior to or after fall. Pt is alert and responding at this time. Hx of dementia.

## 2016-08-03 NOTE — Discharge Instructions (Addendum)
We saw you in the ER after you had a fall. All the imaging results are normal, no fractures seen. No evidence of brain bleed. CT head, CT cervical spine, shoulder Xrays and Pelvic xrays were done. The pain in the shoulder could be contusion - ice, and over the counter pain meds should help. The sling is provided to ensure [name] allows for healing.  We REQUEST PHYSICAL THERAPY TEAM to assess patient for the shoulder discomfort, and while the sling is on work on some range of motion so that the shoulder doesn't freeze.  See the orthopedic doctor in 2 weeks. Sling can be removed if the pain resolves.

## 2016-08-03 NOTE — ED Notes (Signed)
Pt resting with eyes closed, appears to be in no distress. Respirations are even and unlabored.  

## 2016-08-03 NOTE — ED Notes (Signed)
Per pt's daughter skin R forearm happened this morning while getting out of bed.

## 2016-08-03 NOTE — ED Notes (Signed)
Pt has skin tear noted to L forearm, tegaderm in place PTA.

## 2016-08-03 NOTE — ED Provider Notes (Signed)
[facility] DEPT Provider Note   CSN: [id number] Arrival date & time: 08/03/16  1942  By signing my name below, I, Ephriam Jenkins, attest that this documentation has been prepared under the direction and in the presence of Varney Biles, MD. Electronically signed, Ephriam Jenkins, ED Scribe. 08/03/16. 8:38 PM.  History   Chief Complaint Chief Complaint  Patient presents with  . Fall    HPI: LEVEL 5 CAVEAT SECONDARY TO DEMENTIA HPI Comments: SELENE PELTZER is a 82 y.o. female, with Hx of left hip fracture, Alzheimer's, who presents to the Emergency Department, brought in by ambulance, s/p a fall that occurred this evening around 1850. Pt is from [facility] and was being transported in a stand up left and fell, no LOC. Pt currently complains of significant pain to her left shoulder. Pt's friend received phone call at [facility] after the fall and is providing Hx for the pt. Pt is currently taking Robitussin DM for a URI. Friend states that the pt has been staying at the [facility] for approximately two and a half years. Friend also expresses concern for pt's left hip which has Hx of fracture. No medications given PTA. No abdominal pain or lower extremity pain noted.   The history is provided by a friend and the nursing home. No language interpreter was used.    Past Medical History:  Diagnosis Date  . Alzheimer disease   . Anemia   . Anxiety   . Arthritis   . Hyperlipemia   . Hypertension   . Kidney calculus   . Pneumonia     Patient Active Problem List   Diagnosis Date Noted  . Elevated glucose 01/26/2015  . Syncopal episodes 10/02/2014  . Femur fracture ([place]) 11/17/2013  . Dementia 11/17/2013  . HTN (hypertension) 11/17/2013  . CKD (chronic kidney disease), stage IV ([place]) 11/17/2013    Past Surgical History:  Procedure Laterality Date  . CHOLECYSTECTOMY    . HIP ARTHROPLASTY Left 11/18/2013   Procedure: ARTHROPLASTY BIPOLAR HIP;  Surgeon: Sanjuana Kava, MD;  Location: [facility];   Service: Orthopedics;  Laterality: Left;    OB History    No data available       Home Medications    Prior to Admission medications   Medication Sig Start Date End Date Taking? Authorizing Provider  ALPRAZolam (XANAX) 0.25 MG tablet Take 0.25 mg by mouth 2 (two) times daily.   Yes Historical Provider, MD  aspirin EC 81 MG tablet Take 81 mg by mouth daily.   Yes Historical Provider, MD  azithromycin (ZITHROMAX) 250 MG tablet Take by mouth daily.   Yes Historical Provider, MD  calcium-vitamin D (OSCAL WITH D) 500-200 MG-UNIT tablet Take 1 tablet by mouth 2 (two) times daily.   Yes Historical Provider, MD  guaiFENesin-dextromethorphan (ROBITUSSIN DM) 100-10 MG/5ML syrup Take 15 mLs by mouth every 6 (six) hours as needed for cough.   Yes Historical Provider, MD  ipratropium (ATROVENT) 0.02 % nebulizer solution Take 0.5 mg by nebulization 4 (four) times daily.   Yes Historical Provider, MD  metoprolol tartrate (LOPRESSOR) 25 MG tablet Take 12.5 mg by mouth 2 (two) times daily.    Yes Historical Provider, MD  Probiotic Product (RISA-BID PROBIOTIC PO) Take 1 tablet by mouth 2 (two) times daily.   Yes Historical Provider, MD  risperiDONE (RISPERDAL) 1 MG/ML oral solution Take 0.5 mg by mouth 2 (two) times daily.   Yes Historical Provider, MD    Family History Family History  Problem  Relation Age of Onset  . Alzheimer's disease Mother   . Heart attack Father     Social History Social History  Substance Use Topics  . Smoking status: Never Smoker  . Smokeless tobacco: Never Used  . Alcohol use No     Allergies   Sulfa antibiotics   Review of Systems Review of Systems  Unable to perform ROS: Dementia    Physical Exam Updated Vital Signs BP 147/80   Pulse 114   Temp (!) 96.3 F (35.7 C) (Axillary)   Resp 17   Wt 189 lb (85.7 kg)   SpO2 100%   BMI 35.71 kg/m   Physical Exam  Constitutional: She is oriented to person, place, and time. She appears well-developed and  well-nourished. No distress.  HENT:  Head: Normocephalic and atraumatic.  No hematoma, active bleeding. No contusion over the face.   Neck: Normal range of motion.  Cardiovascular: Normal rate and regular rhythm.   Pulmonary/Chest: Effort normal and breath sounds normal.  Abdominal: Soft. There is no tenderness.  No ecchymosis appreciated.  Musculoskeletal: She exhibits edema and tenderness. She exhibits no deformity.  Mild c-spine TTP. Pt has tenderness over the left shoulder and arm. No gross deformity appreciated. There is some edema over the left shoulder. Bilateral extremity reveals no gross deformity. Generalized ecchymosis over the legs appreciated. Pelvis stable, no crepitus. No focal tenderness  Neurological: She is alert and oriented to person, place, and time.  Skin: Skin is warm and dry. She is not diaphoretic.  Psychiatric: She has a normal mood and affect. Judgment normal.  Nursing note and vitals reviewed.    ED Treatments / Results  DIAGNOSTIC STUDIES: Oxygen Saturation is 100% on RA, normal by my interpretation.  COORDINATION OF CARE: 8:23 PM-Discussed treatment plan with pt at bedside and pt agreed to plan.   Labs (all labs ordered are listed, but only abnormal results are displayed) Labs Reviewed  CBG MONITORING, ED    EKG  EKG Interpretation  Date/Time:  Sunday August 03 2016 19:56:00 EST Ventricular Rate:  66 PR Interval:    QRS Duration: 233 QT Interval:  477 QTC Calculation: 500 R Axis:   78 Text Interpretation:  Sinus rhythm Short PR interval Right atrial enlargement Right bundle branch block Inferior infarct, acute No acute changes No significant change since last tracing Confirmed by Kathrynn Humble, MD, Thelma Comp [phone]) on 08/03/2016 9:05:08 PM       Radiology Ct Head Wo Contrast  Result Date: 08/03/2016 CLINICAL DATA:  [place] in stand up lift this evening at 1850 hours. No loss of consciousness. History of Alzheimer's disease, hypertension. EXAM: CT  HEAD WITHOUT CONTRAST CT CERVICAL SPINE WITHOUT CONTRAST TECHNIQUE: Multidetector CT imaging of the head and cervical spine was performed following the standard protocol without intravenous contrast. Multiplanar CT image reconstructions of the cervical spine were also generated. COMPARISON:  CT HEAD and cervical spine November 17, 2013 FINDINGS: CT HEAD FINDINGS BRAIN: Moderate to severe ventriculomegaly with disproportionate temporal lobe volume loss. No intraparenchymal hemorrhage, mass effect nor midline shift. Patchy supratentorial white matter hypodensities less than expected for patient's age, though non-specific are most compatible with chronic small vessel ischemic disease. No acute large vascular territory infarcts. No abnormal extra-axial fluid collections. Basal cisterns are patent. VASCULAR: Moderate calcific atherosclerosis of the carotid siphons. SKULL: No skull fracture. No significant scalp soft tissue swelling. SINUSES/ORBITS: Mild paranasal sinus mucosal thickening. Mastoid air cells are well aerated. The included ocular globes and orbital contents are non-suspicious. OTHER:  None. CT CERVICAL SPINE FINDINGS ALIGNMENT: Maintained lordosis. Vertebral bodies in alignment. SKULL BASE AND VERTEBRAE: Cervical vertebral bodies and posterior elements are intact. Intervertebral disc heights preserved. No destructive bony lesions. C1-2 articulation maintained, severe osteoarthrosis. Moderate RIGHT, mild LEFT multilevel facet arthropathy. SOFT TISSUES AND SPINAL CANAL: Normal. DISC LEVELS: No significant osseous canal stenosis or neural foraminal narrowing. UPPER CHEST: Lung apices are clear. OTHER: None. IMPRESSION: CT HEAD: No acute intracranial process. Stable moderate to severe brain atrophy and mild chronic small vessel ischemic disease. CT CERVICAL SPINE: No acute fracture or malalignment. No advanced degenerative change for age. Electronically Signed   By: Elon Alas M.D.   On: 08/03/2016 22:19    Ct Cervical Spine Wo Contrast  Result Date: 08/03/2016 CLINICAL DATA:  [place] in stand up lift this evening at 1850 hours. No loss of consciousness. History of Alzheimer's disease, hypertension. EXAM: CT HEAD WITHOUT CONTRAST CT CERVICAL SPINE WITHOUT CONTRAST TECHNIQUE: Multidetector CT imaging of the head and cervical spine was performed following the standard protocol without intravenous contrast. Multiplanar CT image reconstructions of the cervical spine were also generated. COMPARISON:  CT HEAD and cervical spine November 17, 2013 FINDINGS: CT HEAD FINDINGS BRAIN: Moderate to severe ventriculomegaly with disproportionate temporal lobe volume loss. No intraparenchymal hemorrhage, mass effect nor midline shift. Patchy supratentorial white matter hypodensities less than expected for patient's age, though non-specific are most compatible with chronic small vessel ischemic disease. No acute large vascular territory infarcts. No abnormal extra-axial fluid collections. Basal cisterns are patent. VASCULAR: Moderate calcific atherosclerosis of the carotid siphons. SKULL: No skull fracture. No significant scalp soft tissue swelling. SINUSES/ORBITS: Mild paranasal sinus mucosal thickening. Mastoid air cells are well aerated. The included ocular globes and orbital contents are non-suspicious. OTHER: None. CT CERVICAL SPINE FINDINGS ALIGNMENT: Maintained lordosis. Vertebral bodies in alignment. SKULL BASE AND VERTEBRAE: Cervical vertebral bodies and posterior elements are intact. Intervertebral disc heights preserved. No destructive bony lesions. C1-2 articulation maintained, severe osteoarthrosis. Moderate RIGHT, mild LEFT multilevel facet arthropathy. SOFT TISSUES AND SPINAL CANAL: Normal. DISC LEVELS: No significant osseous canal stenosis or neural foraminal narrowing. UPPER CHEST: Lung apices are clear. OTHER: None. IMPRESSION: CT HEAD: No acute intracranial process. Stable moderate to severe brain atrophy and mild  chronic small vessel ischemic disease. CT CERVICAL SPINE: No acute fracture or malalignment. No advanced degenerative change for age. Electronically Signed   By: Elon Alas M.D.   On: 08/03/2016 22:19   Dg Shoulder Left  Result Date: 08/03/2016 CLINICAL DATA:  Status post fall, with left shoulder pain. Initial encounter. EXAM: LEFT SHOULDER - 2+ VIEW COMPARISON:  None. FINDINGS: There is no evidence of fracture or dislocation. The left humeral head is seated within the glenoid fossa. There is mild superior subluxation of the left humeral head, reflecting underlying chronic rotator cuff tear. Mild degenerative change is noted at the left acromioclavicular joint. No significant soft tissue abnormalities are seen. The visualized portions of the left lung are clear. IMPRESSION: No evidence of fracture or dislocation. Electronically Signed   By: Garald Balding M.D.   On: 08/03/2016 22:28   Dg Hip Unilat W Or Wo Pelvis 2-3 Views Left  Result Date: 08/03/2016 CLINICAL DATA:  Status post fall, with left hip pain. Initial encounter. EXAM: DG HIP (WITH OR WITHOUT PELVIS) 2-3V LEFT COMPARISON:  None. FINDINGS: There is no evidence of fracture or dislocation. The patient's left hip arthroplasty is grossly unremarkable in appearance. Minimal axial joint space narrowing is noted at the right  hip. The proximal left femur appears intact. Degenerative change is noted along the lower lumbar spine. The sacroiliac joints are unremarkable in appearance. The visualized bowel gas pattern is grossly unremarkable in appearance. IMPRESSION: No evidence of fracture or dislocation. Left hip arthroplasty is grossly unremarkable in appearance. Electronically Signed   By: Garald Balding M.D.   On: 08/03/2016 22:30    Procedures Procedures (including critical care time)  Medications Ordered in ED Medications  fentaNYL (SUBLIMAZE) injection 50 mcg (50 mcg Intravenous Given 08/03/16 2107)     Initial Impression / Assessment  and Plan / ED Course  I have reviewed the triage vital signs and the nursing notes.  Pertinent labs & imaging results that were available during my care of the patient were reviewed by me and considered in my medical decision making (see chart for details).  Clinical Course as of Aug 03 2329  Nancy Fetter Aug 03, 2016  [id number] Family left - when went to give an update. Results are reassuring from the imaging. We will d/c.  [AN]    Clinical Course User Index [AN] Varney Biles, MD    DDx includes: - Mechanical falls - ICH - Fractures - Contusions - Soft tissue injury  We will get CT head, Cspine and appropriate L sided radiographs. Shoulder/humeral head is tender.  Final Clinical Impressions(s) / ED Diagnoses   Final diagnoses:  Fall, initial encounter  Contusion of left shoulder, initial encounter    New Prescriptions New Prescriptions   No medications on file  I personally performed the services described in this documentation, which was scribed in my presence. The recorded information has been reviewed and is accurate.     Varney Biles, MD 08/03/16 2330

## 2016-08-04 ENCOUNTER — Non-Acute Institutional Stay (SKILLED_NURSING_FACILITY): Payer: Medicare Other | Admitting: Internal Medicine

## 2016-08-04 ENCOUNTER — Encounter: Payer: Self-pay | Admitting: Internal Medicine

## 2016-08-04 ENCOUNTER — Telehealth: Payer: Self-pay

## 2016-08-04 ENCOUNTER — Inpatient Hospital Stay
Admission: RE | Admit: 2016-08-04 | Discharge: 2018-12-31 | Disposition: E | Payer: Medicare Other | Source: Ambulatory Visit | Attending: Family Medicine | Admitting: Family Medicine

## 2016-08-04 DIAGNOSIS — J069 Acute upper respiratory infection, unspecified: Secondary | ICD-10-CM | POA: Diagnosis not present

## 2016-08-04 DIAGNOSIS — Y9210 Unspecified residential institution as the place of occurrence of the external cause: Secondary | ICD-10-CM | POA: Diagnosis not present

## 2016-08-04 DIAGNOSIS — M25512 Pain in left shoulder: Secondary | ICD-10-CM | POA: Diagnosis not present

## 2016-08-04 DIAGNOSIS — W19XXXD Unspecified fall, subsequent encounter: Secondary | ICD-10-CM

## 2016-08-04 NOTE — Progress Notes (Signed)
Location:    [facility] Room Number: 106/D Place of Service:  SNF (31) Provider:  Mora Bellman, MD  Patient Care Team: Marjean Donna, MD as PCP - General (Family Medicine) Virgie Dad, MD as Consulting Physician (Geriatric Medicine)  Extended Emergency Contact Information Primary Emergency Contact: [place], [place], [place] [address] Johnnette Litter of [place] Phone: [phone] Mobile Phone: [phone] Relation: Daughter Secondary Emergency Contact: Gen[place], [place], [place] [address] [place] of [place] Phone: [phone] Relation: Son   Goals of care: Advanced Directive information Advanced Directives 08/14/2016  Does Patient Have a Medical Advance Directive? Yes  Type of Advance Directive Out of facility DNR (pink MOST or yellow form)  Does patient want to make changes to medical advance directive? No - Patient declined  Copy of [place] in Chart? -  Pre-existing out of facility DNR order (yellow form or pink MOST form) -     Chief Complaint  Patient presents with  . Acute Visit    Fall   With complaints of left shoulder pain  HPI:  Pt is a 82 y.o. female seen today for an acute visit forFollow-up of a fall over the weekend.  Apparently she was up in the lift and fell out of the lift.  She sustained a chip tooth apparently and complained of left shoulder pain-she did go to the ER where x-rays were negative for any acute process-fractures--CT scan of the spine and head did not show any acute abnormalities.  There was recommendation for orthopedic follow-up for left shoulder discomfort and she currently has the left arm in a sling.  Currently she is resting in bed comfortably.  She is also receiving a Z-Pak for suspected URI with some cough and congestion-chest x-ray was negative for any acute process she appears to be doing better on the Z-Pak as well as Atrovent nebulizers and Robitussin   Do not  appreciate much of a cough today congestion appears improved   Past Medical History:  Diagnosis Date  . Alzheimer disease   . Anemia   . Anxiety   . Arthritis   . Hyperlipemia   . Hypertension   . Kidney calculus   . Pneumonia    Past Surgical History:  Procedure Laterality Date  . CHOLECYSTECTOMY    . HIP ARTHROPLASTY Left 11/18/2013   Procedure: ARTHROPLASTY BIPOLAR HIP;  Surgeon: Sanjuana Kava, MD;  Location: [facility];  Service: Orthopedics;  Laterality: Left;    Allergies  Allergen Reactions  . Sulfa Antibiotics Rash    Current Outpatient Prescriptions on File Prior to Visit  Medication Sig Dispense Refill  . ALPRAZolam (XANAX) 0.25 MG tablet Take 0.25 mg by mouth 2 (two) times daily.    Marland Kitchen aspirin EC 81 MG tablet Take 81 mg by mouth daily.    Marland Kitchen azithromycin (ZITHROMAX) 250 MG tablet Take by mouth daily.    . calcium-vitamin D (OSCAL WITH D) 500-200 MG-UNIT tablet Take 1 tablet by mouth 2 (two) times daily.    Marland Kitchen guaiFENesin-dextromethorphan (ROBITUSSIN DM) 100-10 MG/5ML syrup Take 15 mLs by mouth every 6 (six) hours as needed for cough.    Marland Kitchen ipratropium (ATROVENT) 0.02 % nebulizer solution Take 0.5 mg by nebulization 4 (four) times daily.    . metoprolol tartrate (LOPRESSOR) 25 MG tablet Take 12.5 mg by mouth 2 (two) times daily.     . Probiotic  Product (RISA-BID PROBIOTIC PO) Take 1 tablet by mouth 2 (two) times daily.    . risperiDONE (RISPERDAL) 1 MG/ML oral solution Take 0.5 mg by mouth 2 (two) times daily.     No current facility-administered medications on file prior to visit.     Review of Systems  Unattainable secondary to dementia please see history of present illness  Immunization History  Administered Date(s) Administered  . Influenza-Unspecified 06/08/2014, 06/03/2016  . Pneumococcal-Unspecified 06/10/2016   Pertinent  Health Maintenance Due  Topic Date Due  . DEXA SCAN  06/17/2017 (Originally 06/08/1991)  . PNA vac Low Risk Adult (2 of 2 - PCV13)  06/10/2017  . INFLUENZA VACCINE  Completed   No flowsheet data found. Functional Status Survey:    Vitals:   08/21/2016 1216  BP: 120/70  Pulse: 68  Resp: 18  Temp: 98.5 F (36.9 C)  TempSrc: Oral    Physical Exam   In general this is a frail elderly female in no distress lying comfortably in bed  .  Her skin is warm and dry not diaphoretic she does have an skin tear on her right lower arm-I do not see active bleeding drainage or surrounding erythema concerning for cellulitis g  Eyes pupils appear reactive light sclera and conjunctiva are clear visual acuity appears intact.She has prescription lenses   Oropharynx appears to be clear although she did not open her mouth very wide. There appears to be chipped front tooth  Neck could not really appreciate any adenopathy  Chest is clear to auscultation with poor respiratory effort there is no labored breathing.  Heart is regular rate and rhythm with distant heart sounds.  Her abdomen soft does not appear to be acutely tender  bowel sounds are active abdomen is not distended.   .  Muscle skeletal is able to move her right upper extremity at baseline-she has lower ext  weakness which is baseline  -Left upper extremity ----e hast in a sling but it does not appear to be swollen orr acutely tender at this point to  palpitation  Neurologic she is alert and talking no lateralizing findings   Psych--has significant dementia is not overtly agitated  This afternoon   Labs reviewed:  Recent Labs  04/02/16 0654 05/16/16 0720 07/14/16 1004  NA 141 141 142  K 4.2 4.3 4.2  CL 111 109 110  CO2 24 25 25   GLUCOSE 91 92 133*  BUN 41* 36* 49*  CREATININE 2.22* 1.98* 2.19*  CALCIUM 8.6* 8.8* 8.4*    Recent Labs  12/06/15 0710  AST 12*  ALT 10*  ALKPHOS 69  BILITOT 0.4  PROT 6.5  ALBUMIN 3.3*    Recent Labs  09/07/15 0630 12/06/15 0710 05/16/16 0720 07/14/16 1004  WBC 7.3 7.1 6.7 7.3    NEUTROABS 4.1 3.7  --   --   HGB 10.2* 10.9* 11.3* 10.7*  HCT 31.8* 33.6* 35.5* 33.5*  MCV 89.8 88.9 90.1 91.5  PLT 179 190 190 192   Lab Results  Component Value Date   TSH 3.396 04/02/2016   Lab Results  Component Value Date   HGBA1C 5.2 07/14/2016   Lab Results  Component Value Date   CHOL 166 12/27/2015   HDL 44 12/27/2015   LDLCALC 114 (H) 12/27/2015   TRIG 39 12/27/2015   CHOLHDL 3.8 12/27/2015    Significant Diagnostic Results in last 30 days:  Ct Head Wo Contrast  Result Date: 08/03/2016 CLINICAL DATA:  [place] in stand up lift  this evening at 1850 hours. No loss of consciousness. History of Alzheimer's disease, hypertension. EXAM: CT HEAD WITHOUT CONTRAST CT CERVICAL SPINE WITHOUT CONTRAST TECHNIQUE: Multidetector CT imaging of the head and cervical spine was performed following the standard protocol without intravenous contrast. Multiplanar CT image reconstructions of the cervical spine were also generated. COMPARISON:  CT HEAD and cervical spine November 17, 2013 FINDINGS: CT HEAD FINDINGS BRAIN: Moderate to severe ventriculomegaly with disproportionate temporal lobe volume loss. No intraparenchymal hemorrhage, mass effect nor midline shift. Patchy supratentorial white matter hypodensities less than expected for patient's age, though non-specific are most compatible with chronic small vessel ischemic disease. No acute large vascular territory infarcts. No abnormal extra-axial fluid collections. Basal cisterns are patent. VASCULAR: Moderate calcific atherosclerosis of the carotid siphons. SKULL: No skull fracture. No significant scalp soft tissue swelling. SINUSES/ORBITS: Mild paranasal sinus mucosal thickening. Mastoid air cells are well aerated. The included ocular globes and orbital contents are non-suspicious. OTHER: None. CT CERVICAL SPINE FINDINGS ALIGNMENT: Maintained lordosis. Vertebral bodies in alignment. SKULL BASE AND VERTEBRAE: Cervical vertebral bodies and posterior  elements are intact. Intervertebral disc heights preserved. No destructive bony lesions. C1-2 articulation maintained, severe osteoarthrosis. Moderate RIGHT, mild LEFT multilevel facet arthropathy. SOFT TISSUES AND SPINAL CANAL: Normal. DISC LEVELS: No significant osseous canal stenosis or neural foraminal narrowing. UPPER CHEST: Lung apices are clear. OTHER: None. IMPRESSION: CT HEAD: No acute intracranial process. Stable moderate to severe brain atrophy and mild chronic small vessel ischemic disease. CT CERVICAL SPINE: No acute fracture or malalignment. No advanced degenerative change for age. Electronically Signed   By: Elon Alas M.D.   On: 08/03/2016 22:19   Ct Cervical Spine Wo Contrast  Result Date: 08/03/2016 CLINICAL DATA:  [place] in stand up lift this evening at 1850 hours. No loss of consciousness. History of Alzheimer's disease, hypertension. EXAM: CT HEAD WITHOUT CONTRAST CT CERVICAL SPINE WITHOUT CONTRAST TECHNIQUE: Multidetector CT imaging of the head and cervical spine was performed following the standard protocol without intravenous contrast. Multiplanar CT image reconstructions of the cervical spine were also generated. COMPARISON:  CT HEAD and cervical spine November 17, 2013 FINDINGS: CT HEAD FINDINGS BRAIN: Moderate to severe ventriculomegaly with disproportionate temporal lobe volume loss. No intraparenchymal hemorrhage, mass effect nor midline shift. Patchy supratentorial white matter hypodensities less than expected for patient's age, though non-specific are most compatible with chronic small vessel ischemic disease. No acute large vascular territory infarcts. No abnormal extra-axial fluid collections. Basal cisterns are patent. VASCULAR: Moderate calcific atherosclerosis of the carotid siphons. SKULL: No skull fracture. No significant scalp soft tissue swelling. SINUSES/ORBITS: Mild paranasal sinus mucosal thickening. Mastoid air cells are well aerated. The included ocular globes and  orbital contents are non-suspicious. OTHER: None. CT CERVICAL SPINE FINDINGS ALIGNMENT: Maintained lordosis. Vertebral bodies in alignment. SKULL BASE AND VERTEBRAE: Cervical vertebral bodies and posterior elements are intact. Intervertebral disc heights preserved. No destructive bony lesions. C1-2 articulation maintained, severe osteoarthrosis. Moderate RIGHT, mild LEFT multilevel facet arthropathy. SOFT TISSUES AND SPINAL CANAL: Normal. DISC LEVELS: No significant osseous canal stenosis or neural foraminal narrowing. UPPER CHEST: Lung apices are clear. OTHER: None. IMPRESSION: CT HEAD: No acute intracranial process. Stable moderate to severe brain atrophy and mild chronic small vessel ischemic disease. CT CERVICAL SPINE: No acute fracture or malalignment. No advanced degenerative change for age. Electronically Signed   By: Elon Alas M.D.   On: 08/03/2016 22:19   Dg Shoulder Left  Result Date: 08/03/2016 CLINICAL DATA:  Status post fall, with left  shoulder pain. Initial encounter. EXAM: LEFT SHOULDER - 2+ VIEW COMPARISON:  None. FINDINGS: There is no evidence of fracture or dislocation. The left humeral head is seated within the glenoid fossa. There is mild superior subluxation of the left humeral head, reflecting underlying chronic rotator cuff tear. Mild degenerative change is noted at the left acromioclavicular joint. No significant soft tissue abnormalities are seen. The visualized portions of the left lung are clear. IMPRESSION: No evidence of fracture or dislocation. Electronically Signed   By: Garald Balding M.D.   On: 08/03/2016 22:28   Dg Hip Unilat W Or Wo Pelvis 2-3 Views Left  Result Date: 08/03/2016 CLINICAL DATA:  Status post fall, with left hip pain. Initial encounter. EXAM: DG HIP (WITH OR WITHOUT PELVIS) 2-3V LEFT COMPARISON:  None. FINDINGS: There is no evidence of fracture or dislocation. The patient's left hip arthroplasty is grossly unremarkable in appearance. Minimal axial  joint space narrowing is noted at the right hip. The proximal left femur appears intact. Degenerative change is noted along the lower lumbar spine. The sacroiliac joints are unremarkable in appearance. The visualized bowel gas pattern is grossly unremarkable in appearance. IMPRESSION: No evidence of fracture or dislocation. Left hip arthroplasty is grossly unremarkable in appearance. Electronically Signed   By: Garald Balding M.D.   On: 08/03/2016 22:30    Assessment/Plan 1-fall-at this point she appears to be stable in no distress or acute pain-orthopedic follow-up has been ordered-also will have physical therapy take a look at her--continue to monitor for any changes  #2 history of suspected upper respiratory infection-she is responding well to the Zithromax h--is also receiving Atrovent nebulizers as well as Robitussin-this appears to be resolving unremarkably this point continue to [place], [place], [place]

## 2016-08-04 NOTE — Telephone Encounter (Signed)
Possible re-admission to facility. This is a patient you were seeing at [facility] . [facility] F/U is needed if patient was re-admitted to facility upon discharge. Hospital discharge from [facility] on 08/03/16.

## 2016-08-05 ENCOUNTER — Telehealth: Payer: Self-pay | Admitting: Orthopaedic Surgery

## 2016-08-05 NOTE — Telephone Encounter (Signed)
Call received this morning, 08/05/16, per [name] [facility], to schedule appointment with Dr Luna Glasgow following [name] Emergency room visit.  Scheduled for tomorrow, 08/06/16.  [name] called back shortly thereafter and requested to cancel the appointment.  I offered re-schedule - she states will call if needs to re-schedule.

## 2016-08-06 ENCOUNTER — Ambulatory Visit: Payer: Medicare Other | Admitting: Orthopaedic Surgery

## 2016-08-28 DIAGNOSIS — I70203 Unspecified atherosclerosis of native arteries of extremities, bilateral legs: Secondary | ICD-10-CM | POA: Diagnosis not present

## 2016-08-28 DIAGNOSIS — I739 Peripheral vascular disease, unspecified: Secondary | ICD-10-CM | POA: Diagnosis not present

## 2016-08-28 DIAGNOSIS — M79674 Pain in right toe(s): Secondary | ICD-10-CM | POA: Diagnosis not present

## 2016-08-28 DIAGNOSIS — B351 Tinea unguium: Secondary | ICD-10-CM | POA: Diagnosis not present

## 2016-09-01 DEATH — deceased

## 2016-09-10 ENCOUNTER — Non-Acute Institutional Stay (SKILLED_NURSING_FACILITY): Payer: Medicare Other | Admitting: Internal Medicine

## 2016-09-10 ENCOUNTER — Encounter: Payer: Self-pay | Admitting: Internal Medicine

## 2016-09-10 DIAGNOSIS — N184 Chronic kidney disease, stage 4 (severe): Secondary | ICD-10-CM

## 2016-09-10 DIAGNOSIS — I10 Essential (primary) hypertension: Secondary | ICD-10-CM

## 2016-09-10 DIAGNOSIS — D631 Anemia in chronic kidney disease: Secondary | ICD-10-CM | POA: Diagnosis not present

## 2016-09-10 DIAGNOSIS — F0391 Unspecified dementia with behavioral disturbance: Secondary | ICD-10-CM

## 2016-09-10 DIAGNOSIS — D649 Anemia, unspecified: Secondary | ICD-10-CM | POA: Insufficient documentation

## 2016-09-10 NOTE — Progress Notes (Signed)
Location:   [facility] Room Number: 106/D Place of Service:  SNF (31) Provider:  Julieanne Cotton, MD  Patient Care Team: Marjean Donna, MD as PCP - General (Family Medicine) Virgie Dad, MD as Consulting Physician (Geriatric Medicine)  Extended Emergency Contact Information Primary Emergency Contact: [place], [place], [place] [address] Johnnette Litter of [place] Phone: [phone] Mobile Phone: [phone] Relation: Daughter Secondary Emergency Contact: [place], [place], [place] [address] [place] of [place] Phone: [phone] Relation: Son  Code Status:  DNR Goals of care: Advanced Directive information Advanced Directives 09/10/2016  Does Patient Have a Medical Advance Directive? Yes  Type of Advance Directive Out of facility DNR (pink MOST or yellow form)  Does patient want to make changes to medical advance directive? No - Patient declined  Copy of [place] in Chart? -  Pre-existing out of facility DNR order (yellow form or pink MOST form) -     Chief Complaint  Patient presents with  . Medical Management of Chronic Issues    Routine Visit    HPI:  Pt is a 82 y.o. female seen today for medical management of chronic diseases.   Patient has been long term resident of facility. She has h/o Hypertension Chronic renal disease , h/o femur fracture and Dementia with behavior disturbances Patient had been mostly stable in the facility. She had  one episode of fall and was evaluated in the ED with no new acute Fracture. Her weight has steadily increased to 185 Lbs from 180 last month. Her behavior is stable. She is sitting in the wheel chair with no new complains. Patient unable to give me any history.  Past Medical History:  Diagnosis Date  . Alzheimer disease   . Anemia   . Anxiety   . Arthritis   . Hyperlipemia   . Hypertension   . Kidney calculus   . Pneumonia    Past Surgical  History:  Procedure Laterality Date  . CHOLECYSTECTOMY    . HIP ARTHROPLASTY Left 11/18/2013   Procedure: ARTHROPLASTY BIPOLAR HIP;  Surgeon: Sanjuana Kava, MD;  Location: [facility];  Service: Orthopedics;  Laterality: Left;    Allergies  Allergen Reactions  . Sulfa Antibiotics Rash    Current Outpatient Prescriptions on File Prior to Visit  Medication Sig Dispense Refill  . ALPRAZolam (XANAX) 0.25 MG tablet Take 0.25 mg by mouth 2 (two) times daily.    Marland Kitchen aspirin EC 81 MG tablet Take 81 mg by mouth daily.    . calcium-vitamin D (OSCAL WITH D) 500-200 MG-UNIT tablet Take 1 tablet by mouth 2 (two) times daily.    Marland Kitchen guaiFENesin-dextromethorphan (ROBITUSSIN DM) 100-10 MG/5ML syrup Take 15 mLs by mouth every 6 (six) hours as needed for cough.    . metoprolol tartrate (LOPRESSOR) 25 MG tablet Take 12.5 mg by mouth 2 (two) times daily.     . risperiDONE (RISPERDAL) 1 MG/ML oral solution Take 0.5 mg by mouth 2 (two) times daily.     No current facility-administered medications on file prior to visit.      Review of Systems  Unable to perform ROS: Dementia    Immunization History  Administered Date(s) Administered  . Influenza-Unspecified 06/08/2014, 06/03/2016  . Pneumococcal-Unspecified 06/10/2016   Pertinent  Health Maintenance Due  Topic Date Due  . DEXA SCAN  06/17/2017 (Originally 06/08/1991)  . PNA vac  Low Risk Adult (2 of 2 - PCV13) 06/10/2017  . INFLUENZA VACCINE  Completed   No flowsheet data found. Functional Status Survey:    Vitals:   09/10/16 1152  BP: 120/86  Pulse: 65  Resp: 19  Temp: 98.2 F (36.8 C)  TempSrc: Oral  SpO2: 95%   There is no height or weight on file to calculate BMI. Physical Exam  Constitutional: She appears well-developed and well-nourished.  HENT:  Head: Normocephalic.  Mouth/Throat: Oropharynx is clear and moist.  Eyes: Pupils are equal, round, and reactive to light.  Neck: Neck supple.  Cardiovascular: Normal rate, regular rhythm  and normal heart sounds.   Pulmonary/Chest: Effort normal and breath sounds normal. No respiratory distress. She has no wheezes. She has no rales.  Abdominal: Soft. Bowel sounds are normal. She exhibits no distension. There is no tenderness. There is no rebound.  Musculoskeletal:  B/L edema  Neurological: She is alert.  Not oriented. Does not follow any commands  Skin: Skin is warm and dry.    Labs reviewed:  Recent Labs  04/02/16 0654 05/16/16 0720 07/14/16 1004  NA 141 141 142  K 4.2 4.3 4.2  CL 111 109 110  CO2 24 25 25   GLUCOSE 91 92 133*  BUN 41* 36* 49*  CREATININE 2.22* 1.98* 2.19*  CALCIUM 8.6* 8.8* 8.4*    Recent Labs  12/06/15 0710  AST 12*  ALT 10*  ALKPHOS 69  BILITOT 0.4  PROT 6.5  ALBUMIN 3.3*    Recent Labs  12/06/15 0710 05/16/16 0720 07/14/16 1004  WBC 7.1 6.7 7.3  NEUTROABS 3.7  --   --   HGB 10.9* 11.3* 10.7*  HCT 33.6* 35.5* 33.5*  MCV 88.9 90.1 91.5  PLT 190 190 192   Lab Results  Component Value Date   TSH 3.396 04/02/2016   Lab Results  Component Value Date   HGBA1C 5.2 07/14/2016   Lab Results  Component Value Date   CHOL 166 12/27/2015   HDL 44 12/27/2015   LDLCALC 114 (H) 12/27/2015   TRIG 39 12/27/2015   CHOLHDL 3.8 12/27/2015    Significant Diagnostic Results in last 30 days:  No results found.  Assessment/Plan  CKD (chronic kidney disease), stage IV ([place]) Patient Creat has been anywhere b/w 2-2.2. Will repeat BMP to follow Electrolytes  Essential hypertension BP Controlled on low dose of Metoprolol  Anemia in stage 4 chronic kidney disease  Most likely Cause of anemia Continue to follow  Dementia with behavioral disturbance,  On Xanax and Risperdal PRN. Patient stable. Continue Vit D and Calcium. Not candidate for anything aggressive due to her Dementia and age.  Family/ staff Communication:   Labs/tests ordered:

## 2016-09-11 ENCOUNTER — Encounter (HOSPITAL_COMMUNITY)
Admission: RE | Admit: 2016-09-11 | Discharge: 2016-09-11 | Disposition: A | Discharge status: Home / Self Care | Payer: Medicare Other | Source: Skilled Nursing Facility | Attending: Internal Medicine | Admitting: Internal Medicine

## 2016-09-11 DIAGNOSIS — F0391 Unspecified dementia with behavioral disturbance: Secondary | ICD-10-CM | POA: Insufficient documentation

## 2016-09-11 DIAGNOSIS — F411 Generalized anxiety disorder: Secondary | ICD-10-CM | POA: Diagnosis present

## 2016-09-11 DIAGNOSIS — I129 Hypertensive chronic kidney disease with stage 1 through stage 4 chronic kidney disease, or unspecified chronic kidney disease: Secondary | ICD-10-CM | POA: Insufficient documentation

## 2016-09-11 DIAGNOSIS — Z9181 History of falling: Secondary | ICD-10-CM | POA: Diagnosis present

## 2016-09-11 DIAGNOSIS — D649 Anemia, unspecified: Secondary | ICD-10-CM | POA: Insufficient documentation

## 2016-09-11 LAB — COMPREHENSIVE METABOLIC PANEL
ALK PHOS: 74 U/L (ref 38–126)
ALT: 11 U/L — AB (ref 14–54)
ANION GAP: 5 (ref 5–15)
AST: 12 U/L — ABNORMAL LOW (ref 15–41)
Albumin: 3.1 g/dL — ABNORMAL LOW (ref 3.5–5.0)
BUN: 46 mg/dL — ABNORMAL HIGH (ref 6–20)
CALCIUM: 8.6 mg/dL — AB (ref 8.9–10.3)
CHLORIDE: 109 mmol/L (ref 101–111)
CO2: 25 mmol/L (ref 22–32)
CREATININE: 1.77 mg/dL — AB (ref 0.44–1.00)
GFR, EST AFRICAN AMERICAN: 28 mL/min — AB (ref >60)
GFR, EST NON AFRICAN AMERICAN: 24 mL/min — AB (ref >60)
Glucose, Bld: 91 mg/dL (ref 65–99)
Potassium: 3.8 mmol/L (ref 3.5–5.1)
SODIUM: 139 mmol/L (ref 135–145)
Total Bilirubin: 0.3 mg/dL (ref 0.3–1.2)
Total Protein: 6.3 g/dL — ABNORMAL LOW (ref 6.5–8.1)

## 2016-09-11 LAB — CBC WITH DIFFERENTIAL/PLATELET
Basophils Absolute: 0 10*3/uL (ref 0.0–0.1)
Basophils Relative: 1 %
EOS ABS: 0.3 10*3/uL (ref 0.0–0.7)
EOS PCT: 4 %
HCT: 33.5 % — ABNORMAL LOW (ref 36.0–46.0)
Hemoglobin: 10.9 g/dL — ABNORMAL LOW (ref 12.0–15.0)
LYMPHS ABS: 2 10*3/uL (ref 0.7–4.0)
LYMPHS PCT: 31 %
MCH: 28.8 pg (ref 26.0–34.0)
MCHC: 32.5 g/dL (ref 30.0–36.0)
MCV: 88.6 fL (ref 78.0–100.0)
MONOS PCT: 6 %
Monocytes Absolute: 0.4 10*3/uL (ref 0.1–1.0)
Neutro Abs: 3.7 10*3/uL (ref 1.7–7.7)
Neutrophils Relative %: 58 %
PLATELETS: 198 10*3/uL (ref 150–400)
RBC: 3.78 MIL/uL — ABNORMAL LOW (ref 3.87–5.11)
RDW: 13.6 % (ref 11.5–15.5)
WBC: 6.3 10*3/uL (ref 4.0–10.5)

## 2016-10-06 ENCOUNTER — Other Ambulatory Visit (HOSPITAL_COMMUNITY)
Admission: RE | Admit: 2016-10-06 | Discharge: 2016-10-06 | Disposition: A | Discharge status: Home / Self Care | Payer: Medicare Other | Source: Skilled Nursing Facility | Attending: Internal Medicine | Admitting: Internal Medicine

## 2016-10-06 ENCOUNTER — Encounter: Payer: Self-pay | Admitting: Internal Medicine

## 2016-10-06 ENCOUNTER — Non-Acute Institutional Stay (SKILLED_NURSING_FACILITY): Payer: Medicare Other | Admitting: Internal Medicine

## 2016-10-06 ENCOUNTER — Other Ambulatory Visit: Payer: Self-pay | Admitting: *Deleted

## 2016-10-06 DIAGNOSIS — F0391 Unspecified dementia with behavioral disturbance: Secondary | ICD-10-CM | POA: Diagnosis present

## 2016-10-06 DIAGNOSIS — F411 Generalized anxiety disorder: Secondary | ICD-10-CM | POA: Diagnosis present

## 2016-10-06 DIAGNOSIS — R509 Fever, unspecified: Secondary | ICD-10-CM

## 2016-10-06 DIAGNOSIS — Z9181 History of falling: Secondary | ICD-10-CM | POA: Insufficient documentation

## 2016-10-06 DIAGNOSIS — I129 Hypertensive chronic kidney disease with stage 1 through stage 4 chronic kidney disease, or unspecified chronic kidney disease: Secondary | ICD-10-CM | POA: Insufficient documentation

## 2016-10-06 DIAGNOSIS — F29 Unspecified psychosis not due to a substance or known physiological condition: Secondary | ICD-10-CM | POA: Diagnosis not present

## 2016-10-06 DIAGNOSIS — J189 Pneumonia, unspecified organism: Secondary | ICD-10-CM | POA: Diagnosis not present

## 2016-10-06 LAB — URINALYSIS, ROUTINE W REFLEX MICROSCOPIC
Bilirubin Urine: NEGATIVE
Glucose, UA: NEGATIVE mg/dL
Ketones, ur: NEGATIVE mg/dL
NITRITE: NEGATIVE
Protein, ur: NEGATIVE mg/dL
Specific Gravity, Urine: 1.008 (ref 1.005–1.030)
pH: 6 (ref 5.0–8.0)

## 2016-10-06 LAB — INFLUENZA PANEL BY PCR (TYPE A & B)
Influenza A By PCR: NEGATIVE
Influenza B By PCR: NEGATIVE

## 2016-10-06 MED ORDER — ALPRAZOLAM 0.25 MG PO TABS: ORAL_TABLET | ORAL | 0 refills | Status: DC

## 2016-10-06 NOTE — Progress Notes (Signed)
Location:   [facility] Room Number: 106/D Place of Service:  SNF (31) Provider:  Mora Bellman, MD  Patient Care Team: Marjean Donna, MD as PCP - General (Family Medicine) Virgie Dad, MD as Consulting Physician (Geriatric Medicine)  Extended Emergency Contact Information Primary Emergency Contact: [place], [place], [place] [address] Johnnette Litter of [place] Phone: [phone] Mobile Phone: [phone] Relation: Daughter Secondary Emergency Contact: Wenda, [place], [place] [address] [place] of [place] Phone: [phone] Relation: Son  Code Status:  DNR Goals of care: Advanced Directive information Advanced Directives 10/06/2016  Does Patient Have a Medical Advance Directive? Yes  Type of Advance Directive Out of facility DNR (pink MOST or yellow form)  Does patient want to make changes to medical advance directive? No - Patient declined  Copy of [place] in Chart? -  Pre-existing out of facility DNR order (yellow form or pink MOST form) -   Chief complaint Acute visit secondary to fever of unknown origin   HPI:  Pt is a 83 y.o. female seen today for an acute visit for fever of unknown origin.  Apparently she has had a decreased cough recently-vitals were taken today which showed a temperature of 101.1-otherwise vital signs were within stable limits HER saturation was 97% on room air.  Patient has severe dementia and cannot give any review of systems however she has had a cough pursed nursing staff and her daughter.  We have obtained a flu swab as well which was negative for influenza.  Currently she is sitting in her wheelchair comfortably she does not appear to be in any distress-she is often agitated with exam but actually was compliant today although she does not really follow any verbal commands because of her dementia.  According to her daughter she did eat and drink very well this  morning   Past Medical History:  Diagnosis Date  . Alzheimer disease   . Anemia   . Anxiety   . Arthritis   . Hyperlipemia   . Hypertension   . Kidney calculus   . Pneumonia    Past Surgical History:  Procedure Laterality Date  . CHOLECYSTECTOMY    . HIP ARTHROPLASTY Left 11/18/2013   Procedure: ARTHROPLASTY BIPOLAR HIP;  Surgeon: Sanjuana Kava, MD;  Location: [facility];  Service: Orthopedics;  Laterality: Left;    Allergies  Allergen Reactions  . Sulfa Antibiotics Rash    Current Outpatient Prescriptions on File Prior to Visit  Medication Sig Dispense Refill  . ALPRAZolam (XANAX) 0.25 MG tablet Take one tablet by mouth twice daily for anxiety. Hold for sedation, Resp.Depression. 60 tablet 0  . aspirin EC 81 MG tablet Take 81 mg by mouth daily.    . calcium-vitamin D (OSCAL WITH D) 500-200 MG-UNIT tablet Take 1 tablet by mouth 2 (two) times daily.    Marland Kitchen guaiFENesin-dextromethorphan (ROBITUSSIN DM) 100-10 MG/5ML syrup Take 15 mLs by mouth every 6 (six) hours as needed for cough.    . metoprolol tartrate (LOPRESSOR) 25 MG tablet Take 12.5 mg by mouth 2 (two) times daily.     . risperiDONE (RISPERDAL) 1 MG/ML oral solution Take 0.5 mg by mouth 2 (two) times daily.     No current facility-administered medications on file prior to visit.      Review of Systems   Essentially unattainable secondary to dementia please see history  of present illness  Immunization History  Administered Date(s) Administered  . Influenza-Unspecified 06/08/2014, 06/03/2016  . Pneumococcal-Unspecified 06/10/2016   Pertinent  Health Maintenance Due  Topic Date Due  . DEXA SCAN  06/17/2017 (Originally 06/08/1991)  . PNA vac Low Risk Adult (2 of 2 - PCV13) 06/10/2017  . INFLUENZA VACCINE  Completed   No flowsheet data found. Functional Status Survey:    Vitals:   10/06/16 1218  BP: 137/64  Pulse: 72  Resp: 18  Temp: (!) 101.1 F (38.4 C)  TempSrc: Oral  SpO2: 97%    Physical Exam   In  general this is a frail elderly female who appears to be in no distress sitting comfortably in her wheelchair today.  Her skin is warm and dry she does have a small scaly area under her right orbital area I do not really see any drainage increased erythema or sign of infection here but will have to be watched.  Eyes sclera and conjunctiva are clear she has prescription lenses.  Oropharynx appears clear mucous membranes appear fairly moist.  Chest there is poor respiratory effort since she does not follow verbal commands but could not really hear any overt congestion.  Heart is distant heart sounds regular rate and rhythm cannot really appreciate a murmur gallop or rub she has baseline venous stasis changes lower extremity edema.  Abdomen is obese soft does not appear to be tender there are positive bowel sounds.  Musculoskeletal appears able to move her extremities 4 with baseline lower extremity weakness.  Neurologic she is alert does make eye contact moves all her extremities cannot really appreciate any lateralizing findings.  Psych again she has severe dementia but is not agitated with exam today did allow examination.     Labs reviewed:  Recent Labs  05/16/16 0720 07/14/16 1004 09/11/16 0500  NA 141 142 139  K 4.3 4.2 3.8  CL 109 110 109  CO2 25 25 25   GLUCOSE 92 133* 91  BUN 36* 49* 46*  CREATININE 1.98* 2.19* 1.77*  CALCIUM 8.8* 8.4* 8.6*    Recent Labs  12/06/15 0710 09/11/16 0500  AST 12* 12*  ALT 10* 11*  ALKPHOS 69 74  BILITOT 0.4 0.3  PROT 6.5 6.3*  ALBUMIN 3.3* 3.1*    Recent Labs  12/06/15 0710 05/16/16 0720 07/14/16 1004 09/11/16 0500  WBC 7.1 6.7 7.3 6.3  NEUTROABS 3.7  --   --  3.7  HGB 10.9* 11.3* 10.7* 10.9*  HCT 33.6* 35.5* 33.5* 33.5*  MCV 88.9 90.1 91.5 88.6  PLT 190 190 192 198   Lab Results  Component Value Date   TSH 3.396 04/02/2016   Lab Results  Component Value Date   HGBA1C 5.2 07/14/2016   Lab Results  Component  Value Date   CHOL 166 12/27/2015   HDL 44 12/27/2015   LDLCALC 114 (H) 12/27/2015   TRIG 39 12/27/2015   CHOLHDL 3.8 12/27/2015    Significant Diagnostic Results in last 30 days:  No results found.  Assessment/Plan #1 fever of unknown origin-again flu swab has returned negative-I did discuss patient's status with her daughter via phone-will order a 2 view chest x-ray-also wobtain a urinalysis and culture.  Also monitor vital signs pulse ox every shift for 48 hours to monitor her status.  Again this could be a viral illness but will await results of the urine and chest x-ray.  Also tomorrow morning obtain  a CBC with differential and metabolic panel.  For cough  will add Robitussin  DM  10 mL every 6 hours  for 48 hours and then when necessary-  [place], [place], [place]

## 2016-10-06 NOTE — Telephone Encounter (Signed)
[facility] #[phone] Fax: [phone]   

## 2016-10-07 ENCOUNTER — Encounter (HOSPITAL_COMMUNITY)
Admission: RE | Admit: 2016-10-07 | Discharge: 2016-10-07 | Disposition: A | Discharge status: Home / Self Care | Payer: Medicare Other | Source: Skilled Nursing Facility | Attending: Internal Medicine | Admitting: Internal Medicine

## 2016-10-07 DIAGNOSIS — F0391 Unspecified dementia with behavioral disturbance: Secondary | ICD-10-CM | POA: Insufficient documentation

## 2016-10-07 DIAGNOSIS — F411 Generalized anxiety disorder: Secondary | ICD-10-CM | POA: Insufficient documentation

## 2016-10-07 DIAGNOSIS — Z9181 History of falling: Secondary | ICD-10-CM | POA: Insufficient documentation

## 2016-10-07 DIAGNOSIS — I129 Hypertensive chronic kidney disease with stage 1 through stage 4 chronic kidney disease, or unspecified chronic kidney disease: Secondary | ICD-10-CM | POA: Insufficient documentation

## 2016-10-07 LAB — COMPREHENSIVE METABOLIC PANEL
ALBUMIN: 2.9 g/dL — AB (ref 3.5–5.0)
ALK PHOS: 72 U/L (ref 38–126)
ALT: 11 U/L — AB (ref 14–54)
AST: 14 U/L — AB (ref 15–41)
Anion gap: 6 (ref 5–15)
BUN: 41 mg/dL — ABNORMAL HIGH (ref 6–20)
CALCIUM: 8.3 mg/dL — AB (ref 8.9–10.3)
CHLORIDE: 108 mmol/L (ref 101–111)
CO2: 24 mmol/L (ref 22–32)
CREATININE: 2.06 mg/dL — AB (ref 0.44–1.00)
GFR calc Af Amer: 23 mL/min — ABNORMAL LOW (ref >60)
GFR calc non Af Amer: 20 mL/min — ABNORMAL LOW (ref >60)
GLUCOSE: 201 mg/dL — AB (ref 65–99)
Potassium: 3.9 mmol/L (ref 3.5–5.1)
SODIUM: 138 mmol/L (ref 135–145)
Total Bilirubin: 0.5 mg/dL (ref 0.3–1.2)
Total Protein: 5.8 g/dL — ABNORMAL LOW (ref 6.5–8.1)

## 2016-10-07 LAB — CBC WITH DIFFERENTIAL/PLATELET
Basophils Absolute: 0 10*3/uL (ref 0.0–0.1)
Basophils Relative: 1 %
Eosinophils Absolute: 0.3 10*3/uL (ref 0.0–0.7)
Eosinophils Relative: 5 %
HCT: 34.2 % — ABNORMAL LOW (ref 36.0–46.0)
Hemoglobin: 11 g/dL — ABNORMAL LOW (ref 12.0–15.0)
LYMPHS PCT: 33 %
Lymphs Abs: 2.1 10*3/uL (ref 0.7–4.0)
MCH: 28.9 pg (ref 26.0–34.0)
MCHC: 32.2 g/dL (ref 30.0–36.0)
MCV: 89.8 fL (ref 78.0–100.0)
MONO ABS: 0.3 10*3/uL (ref 0.1–1.0)
MONOS PCT: 4 %
NEUTROS ABS: 3.6 10*3/uL (ref 1.7–7.7)
Neutrophils Relative %: 57 %
PLATELETS: 187 10*3/uL (ref 150–400)
RBC: 3.81 MIL/uL — ABNORMAL LOW (ref 3.87–5.11)
RDW: 13.7 % (ref 11.5–15.5)
WBC: 6.2 10*3/uL (ref 4.0–10.5)

## 2016-10-08 ENCOUNTER — Encounter: Payer: Self-pay | Admitting: Internal Medicine

## 2016-10-08 ENCOUNTER — Non-Acute Institutional Stay (SKILLED_NURSING_FACILITY): Payer: Medicare Other | Admitting: Internal Medicine

## 2016-10-08 DIAGNOSIS — R509 Fever, unspecified: Secondary | ICD-10-CM

## 2016-10-08 DIAGNOSIS — N184 Chronic kidney disease, stage 4 (severe): Secondary | ICD-10-CM

## 2016-10-08 DIAGNOSIS — R059 Cough, unspecified: Secondary | ICD-10-CM

## 2016-10-08 DIAGNOSIS — R05 Cough: Secondary | ICD-10-CM

## 2016-10-08 NOTE — Progress Notes (Signed)
Location:   [facility] Room Number: 106/D Place of Service:  SNF (31) Provider:  Mora Bellman, MD  Patient Care Team: Marjean Donna, MD as PCP - General (Family Medicine) Virgie Dad, MD as Consulting Physician (Geriatric Medicine)  Extended Emergency Contact Information Primary Emergency Contact: Julianah, [place], [place] [address] Johnnette Litter of [place] Phone: [phone] Mobile Phone: [phone] Relation: Daughter Secondary Emergency Contact: Shandiin, [place],  [address] [place] of [place] Phone: [phone] Relation: Son  Code Status:  DNR Goals of care: Advanced Directive information Advanced Directives 10/08/2016  Does Patient Have a Medical Advance Directive? Yes  Type of Advance Directive Out of facility DNR (pink MOST or yellow form)  Does patient want to make changes to medical advance directive? No - Patient declined  Copy of [place] in Chart? -  Pre-existing out of facility DNR order (yellow form or pink MOST form) -     Chief Complaint  Patient presents with  . Acute Visit    Cough    HPI:  Pt is a 83 y.o. female seen today for an acute visit for Follow-up of cough as well as renal insufficiency--- She did have a low-grade temperature with cough earlier this week and we did order flu swab which came back negative-also ordered a chest x-ray which did not show any acute process.  She was prescribed Robitussin routinely for 2 days and then when necessary-according in nursing staff the cough appears to be improved when she is receiving the Robitussin-she appears to be at her baseline today no sign of distress vital signs are stable she is somewhat combative with exam which is more her baseline.  We also obtained a urinalysis and culture urinalysis is come back showing many bacteria large leukocytes negative nitrite WBCs too numerous count-we are awaiting final culture  results and she is afebrile she cannot really give any review of systems but again appears to be more her baseline today somewhat agitated alert appears to be at baseline   She also has a history of renal insufficiency with a creatinine on lab done yesterday of 2.06-this appears relatively baseline with her with creatinine usually running from the high ones to the low twos.  BUNs are largely in the 36s.  According in nursing staff she is eating and drinking well today of note Will also did a CBC which showed stability with a hemoglobin of 11 white count within normal range at 6.2    Past Medical History:  Diagnosis Date  . Alzheimer disease   . Anemia   . Anxiety   . Arthritis   . Hyperlipemia   . Hypertension   . Kidney calculus   . Pneumonia    Past Surgical History:  Procedure Laterality Date  . CHOLECYSTECTOMY    . HIP ARTHROPLASTY Left 11/18/2013   Procedure: ARTHROPLASTY BIPOLAR HIP;  Surgeon: Sanjuana Kava, MD;  Location: [facility];  Service: Orthopedics;  Laterality: Left;    Allergies  Allergen Reactions  . Sulfa Antibiotics Rash    Current Outpatient Prescriptions on File Prior to Visit  Medication Sig Dispense Refill  . ALPRAZolam (XANAX) 0.25 MG tablet Take one tablet by mouth twice daily for anxiety. Hold for sedation, Resp.Depression. 60 tablet 0  . aspirin EC 81 MG tablet Take 81 mg by mouth daily.    . calcium-vitamin D (  OSCAL WITH D) 500-200 MG-UNIT tablet Take 1 tablet by mouth 2 (two) times daily.    Marland Kitchen guaiFENesin-dextromethorphan (ROBITUSSIN DM) 100-10 MG/5ML syrup Take 10 mLs by mouth every 6 (six) hours as needed for cough.     . metoprolol tartrate (LOPRESSOR) 25 MG tablet Take 12.5 mg by mouth 2 (two) times daily.     . risperiDONE (RISPERDAL) 1 MG/ML oral solution Take 0.5 mg by mouth 2 (two) times daily.     No current facility-administered medications on file prior to visit.     Review of Systems   Essentially unattainable secondary to dementia  please see history of present illness  Immunization History  Administered Date(s) Administered  . Influenza-Unspecified 06/08/2014, 06/03/2016  . Pneumococcal-Unspecified 06/10/2016   Pertinent  Health Maintenance Due  Topic Date Due  . DEXA SCAN  06/17/2017 (Originally 06/08/1991)  . PNA vac Low Risk Adult (2 of 2 - PCV13) 06/10/2017  . INFLUENZA VACCINE  Completed   No flowsheet data found. Functional Status Survey:    Vitals:   10/08/16 1425  BP: 119/66  Pulse: 81  Resp: 20  Temp: 97.9 F (36.6 C)  TempSrc: Oral    Physical Exam   In general is a frail elderly female who is bright and alert today sitting comfortably in her wheelchair.  There is no sign of distress.  Her skin is warm and dry.  Eyes she does have prescription lenses sclera and conjunctiva clear visual acuity appears grossly intact.  Oropharynx is clear although she did not open her mouth very wide mucous membranes appear moist from what I can see.  Chest has poor respiratory effort there is no labored breathing I could not really appreciate any congestion.  Heart is regular rate and rhythm without murmur gallop or rub she has baseline edema venous stasis changes bilaterally lower extremities.  Her abdomen is obese soft nontender positive bowel sounds.  Muscle skeletal is able to move all her extremities 4 again somewhat agitated with exam today strength certainly appears to be intact.  Neurologic again she is alert somewhat agitated with exam moves all her extremities appears at baseline no lateralizing findings.  Psych she does have severe dementia and somewhat agitated with exam which is normal for her  Labs reviewed:  Recent Labs  07/14/16 1004 09/11/16 0500 10/07/16 0930  NA 142 139 138  K 4.2 3.8 3.9  CL 110 109 108  CO2 25 25 24   GLUCOSE 133* 91 201*  BUN 49* 46* 41*  CREATININE 2.19* 1.77* 2.06*  CALCIUM 8.4* 8.6* 8.3*    Recent Labs  12/06/15 0710 09/11/16 0500  10/07/16 0930  AST 12* 12* 14*  ALT 10* 11* 11*  ALKPHOS 69 74 72  BILITOT 0.4 0.3 0.5  PROT 6.5 6.3* 5.8*  ALBUMIN 3.3* 3.1* 2.9*    Recent Labs  12/06/15 0710  07/14/16 1004 09/11/16 0500 10/07/16 0930  WBC 7.1  < > 7.3 6.3 6.2  NEUTROABS 3.7  --   --  3.7 3.6  HGB 10.9*  < > 10.7* 10.9* 11.0*  HCT 33.6*  < > 33.5* 33.5* 34.2*  MCV 88.9  < > 91.5 88.6 89.8  PLT 190  < > 192 198 187  < > = values in this interval not displayed. Lab Results  Component Value Date   TSH 3.396 04/02/2016   Lab Results  Component Value Date   HGBA1C 5.2 07/14/2016   Lab Results  Component Value Date   CHOL  166 12/27/2015   HDL 44 12/27/2015   LDLCALC 114 (H) 12/27/2015   TRIG 39 12/27/2015   CHOLHDL 3.8 12/27/2015    Significant Diagnostic Results in last 30 days:  No results found.  Assessment/Plan  #1 cough-at this point appears to be stable chest x-ray was not concerning her white count is not elevated she does not appear to be in any distress no fever no chills at this point will monitor I do not really see a need for an antibiotic at this time.  #2 renal insufficiency she appears to be relatively at her baseline apparently is eating and drinking fairly well today which is encouraging at this point will monitor.  #3 fever unknown origin-she has been afebrile now-urine culture is pending will await those results if she becomes overtly symptomatic certainly consider empiric antibiotic.  [place], [place], [place]

## 2016-10-10 ENCOUNTER — Non-Acute Institutional Stay (SKILLED_NURSING_FACILITY): Payer: Medicare Other | Admitting: Internal Medicine

## 2016-10-10 DIAGNOSIS — L989 Disorder of the skin and subcutaneous tissue, unspecified: Secondary | ICD-10-CM

## 2016-10-10 DIAGNOSIS — N390 Urinary tract infection, site not specified: Secondary | ICD-10-CM | POA: Diagnosis not present

## 2016-10-10 LAB — URINE CULTURE: Culture: >=100000 — AB

## 2016-10-10 NOTE — Progress Notes (Signed)
This is an acute visit.  The low care skilled.  Facility is [facility].  Chief complaint-acute visit follow-up facial lesion-also UTI.  History of present illness.  Patient is a 83 year old female with a history of severe dementia-she did spike a of fever recently-and lab work was done including a urinalysis and culture which she had come back positive for UTI--greater than 100,000 colonies of Klebsiella pneumonia.  She has been started on Cipro 250 mg twice a day, for 7 days this was started yesterday.--- She currently is afebrile she cannot really give any review of systems secondary to dementia but she appears to be at her baseline certainly does not show any signs of sepsis no fever chills diaphoresis tachycardia.  Also following up today on a right facial lesion I saw several days ago and appeared to be a small bump-appears it possibly is increased slightly in size today it is flesh-colored does not appear to be tender there is no drainage this may be a small cyst  Past Medical History:  Diagnosis Date  . Alzheimer disease   . Anemia   . Anxiety   . Arthritis   . Hyperlipemia   . Hypertension   . Kidney calculus   . Pneumonia         Past Surgical History:  Procedure Laterality Date  . CHOLECYSTECTOMY    . HIP ARTHROPLASTY Left 11/18/2013   Procedure: ARTHROPLASTY BIPOLAR HIP;  Surgeon: Sanjuana Kava, MD;  Location: [facility];  Service: Orthopedics;  Laterality: Left;        Allergies  Allergen Reactions  . Sulfa Antibiotics Rash          Current Outpatient Prescriptions on File Prior to Visit  Medication Sig Dispense Refill  . ALPRAZolam (XANAX) 0.25 MG tablet Take one tablet by mouth twice daily for anxiety. Hold for sedation, Resp.Depression. 60 tablet 0  . aspirin EC 81 MG tablet Take 81 mg by mouth daily.    . calcium-vitamin D (OSCAL WITH D) 500-200 MG-UNIT tablet Take 1 tablet by mouth 2 (two) times daily.    Marland Kitchen  guaiFENesin-dextromethorphan (ROBITUSSIN DM) 100-10 MG/5ML syrup Take 10 mLs by mouth every 6 (six) hours as needed for cough.     . metoprolol tartrate (LOPRESSOR) 25 MG tablet Take 12.5 mg by mouth 2 (two) times daily.     . risperiDONE (RISPERDAL) 1 MG/ML oral solution Take 0.5 mg by mouth 2 (two) times daily.     No current facility-administered medications on file prior to visit.     Review of Systems   Essentially unattainable secondary to dementia please see history of present illness      Immunization History  Administered Date(s) Administered  . Influenza-Unspecified 06/08/2014, 06/03/2016  . Pneumococcal-Unspecified 06/10/2016       Pertinent  Health Maintenance Due  Topic Date Due  . DEXA SCAN  06/17/2017 (Originally 06/08/1991)  . PNA vac Low Risk Adult (2 of 2 - PCV13) 06/10/2017  . INFLUENZA VACCINE  Completed   No flowsheet data found. Functional Status Survey:  Temperature is 98.6 pulse 75 respirations 20 blood pressure 117/60 O2 saturation is 98% on room air  Physical Exam   In general is a frail elderly female who is bright and alert today sitting comfortably in her wheelchair.  There is no sign of distress.  Her skin is warm and dry.--Inferior to her right eye is a small raised firm area this is flesh-colored nonerythematous does not appear to be tender there  is no drainage has somewhat of a cystlike appearance  Eyes she does have prescription lenses sclera and conjunctiva clear visual acuity appears grossly intact.     Chest has poor respiratory effort there is no labored breathing I could not really appreciate any congestion.  Heart is regular rate and rhythm without murmur gallop or rub she has baseline edema venous stasis changes bilaterally lower extremities.  Her abdomen is obese soft nontender positive bowel sounds. GU could not really appreciate any suprapubic distention or tenderness at on see any rashes vaginal  area Muscle skeletal is able to move all her extremities 4    Psych-is not overtly agitated with exam today  Labs reviewed:  Recent Labs (within last 365 days)   Recent Labs  07/14/16 1004 09/11/16 0500 10/07/16 0930  NA 142 139 138  K 4.2 3.8 3.9  CL 110 109 108  CO2 25 25 24   GLUCOSE 133* 91 201*  BUN 49* 46* 41*  CREATININE 2.19* 1.77* 2.06*  CALCIUM 8.4* 8.6* 8.3*      Recent Labs (within last 365 days)   Recent Labs  12/06/15 0710 09/11/16 0500 10/07/16 0930  AST 12* 12* 14*  ALT 10* 11* 11*  ALKPHOS 69 74 72  BILITOT 0.4 0.3 0.5  PROT 6.5 6.3* 5.8*  ALBUMIN 3.3* 3.1* 2.9*      Recent Labs (within last 365 days)   Recent Labs  12/06/15 0710  07/14/16 1004 09/11/16 0500 10/07/16 0930  WBC 7.1  < > 7.3 6.3 6.2  NEUTROABS 3.7  --   --  3.7 3.6  HGB 10.9*  < > 10.7* 10.9* 11.0*  HCT 33.6*  < > 33.5* 33.5* 34.2*  MCV 88.9  < > 91.5 88.6 89.8  PLT 190  < > 192 198 187  < > = values in this interval not displayed.   Recent Labs       Lab Results  Component Value Date   TSH 3.396 04/02/2016     Recent Labs       Lab Results  Component Value Date   HGBA1C 5.2 07/14/2016     Recent Labs    Assessment and plan.  #1 UTI-again she has been started on Cipro for Klebsiella pneumonia will complete a seven-day course she is also on a probiotic for 10 days-clinically appears to be stable.  #2 possible facial cyst-will order a warm compress twice a day for several days to see if this helps don't see any sign of infection here monitor for any changes however  [id number]

## 2016-10-18 ENCOUNTER — Non-Acute Institutional Stay (SKILLED_NURSING_FACILITY): Payer: Medicare Other | Admitting: Internal Medicine

## 2016-10-18 DIAGNOSIS — I10 Essential (primary) hypertension: Secondary | ICD-10-CM | POA: Diagnosis not present

## 2016-10-18 DIAGNOSIS — F0391 Unspecified dementia with behavioral disturbance: Secondary | ICD-10-CM | POA: Diagnosis not present

## 2016-10-18 DIAGNOSIS — D631 Anemia in chronic kidney disease: Secondary | ICD-10-CM

## 2016-10-18 DIAGNOSIS — N184 Chronic kidney disease, stage 4 (severe): Secondary | ICD-10-CM | POA: Diagnosis not present

## 2016-10-19 ENCOUNTER — Encounter: Payer: Self-pay | Admitting: Internal Medicine

## 2016-10-19 NOTE — Progress Notes (Signed)
This is a routine visit.  Level care skilled.  Facility is [facility].  Chief complaint-medical management of chronic medical conditions including dementia-chronic kidney disease-hypertension-  History of present illness.  Patient is a 83 year old female with the above diagnoses-most recent acute issue was fever of unknown origin this turned out I suspect to be a UTI was positive for Klebsiella pneumonia and she's completed a course of Cipro-she has been afebrile and appears to be at her baseline.  We have been following a slight lesion on her right upper cheek area this appears to be more of a cystlike area this appears unchanged today but will warrant follow up.  Currently she is resting in bed she has a history of significant dementia and is followed by psychiatric services she continues on Risperdal as well as Xanax and this appears to be relatively stabilized-at one point this was quite an issue but behaviors have improved.  She does have a history of hypertension she is on low-dose Lopressor blood pressures appear to be variable I took it manually got 120/60  I see elevated systolics at times I suspect this may be agitation related but will have to be monitored.   does continue to gain weight most recently 189.8 this appears gain of about 5 pounds over the past month-have not really noticed any increase edema chest congestion or discomfort apparently she is eating quite well  Past Medical History:  Diagnosis Date  . Alzheimer disease   . Anemia   . Anxiety   . Arthritis   . Hyperlipemia   . Hypertension   . Kidney calculus   . Pneumonia         Past Surgical History:  Procedure Laterality Date  . CHOLECYSTECTOMY    . HIP ARTHROPLASTY Left 11/18/2013   Procedure: ARTHROPLASTY BIPOLAR HIP;  Surgeon: Sanjuana Kava, MD;  Location: [facility];  Service: Orthopedics;  Laterality: Left;        Allergies  Allergen Reactions  . Sulfa Antibiotics Rash      Current Outpatient Prescriptions on File Prior to Visit  Medication Sig Dispense Refill  . ALPRAZolam (XANAX) 0.25 MG tablet Take 0.25 mg by mouth 2 (two) times daily.    Marland Kitchen aspirin EC 81 MG tablet Take 81 mg by mouth daily.    . calcium-vitamin D (OSCAL WITH D) 500-200 MG-UNIT tablet Take 1 tablet by mouth 2 (two) times daily.    Marland Kitchen guaiFENesin-dextromethorphan (ROBITUSSIN DM) 100-10 MG/5ML syrup Take 15 mLs by mouth every 6 (six) hours as needed for cough.    . metoprolol tartrate (LOPRESSOR) 25 MG tablet Take 12.5 mg by mouth 2 (two) times daily.     . risperiDONE (RISPERDAL) 1 MG/ML oral solution Take 0.5 mg by mouth 2 (two) times daily.     No current facility-administered medications on file prior to visit.      Review of Systems  Unable to perform ROS: Dementia        Immunization History  Administered Date(s) Administered  . Influenza-Unspecified 06/08/2014, 06/03/2016  . Pneumococcal-Unspecified 06/10/2016       Pertinent  Health Maintenance Due  Topic Date Due  . DEXA SCAN  06/17/2017 (Originally 06[birth year]08/1991)  . PNA vac Low Risk Adult (2 of 2 - PCV13) 06/10/2017  . INFLUENZA VACCINE  Completed   No flowsheet data found. Functional Status Survey: Temperature 98.4 pulse 87 respirations 18 blood pressure 120/60 manually-weight is 189.8   Physical Exam  Constitutional: She appears well-developed and well-nourished. Lying  comfortably in bed.  Her skin is warm and dry she does have a small raised nodular area that is flesh-colored right cheek area I do not see again any drainage or erythema or tenderness or warmth HENT:  Head: Normocephalic.  Mouth/Throat: Oropharynx is clear and moist.  Eyes: Pupils are equal, round, and reactive to light.  Neck: Neck supple.  Cardiovascular: Normal rate, regular rhythm and normal heart sounds.   Pulmonary/Chest: Effort normal and breath sounds normal. No respiratory distress. She has no wheezes. She has no rales.   Abdominal: Soft. Bowel sounds are normal. She exhibits no distension. There is no tenderness. There is no rebound.  Musculoskeletal:  B/L edema  which appears baseline fairly mild as is able to move all extremities 4 strength appears to be intact all 4 extremities  Neurological: She is alert.  Not oriented. Does not follow any commands    Labs reviewed:  Every 6 2018.  Sodium 138 potassium 3.9 BUN 41 creatinine 2.06.  Albumin 2.9-AST 14-ALT 11 otherwise liver function tests within normal limits  WBC 6.2 hemoglobin 11.0 platelets 187  Recent Labs (within last 365 days)   Recent Labs  04/02/16 0654 05/16/16 0720 07/14/16 1004  NA 141 141 142  K 4.2 4.3 4.2  CL 111 109 110  CO2 24 25 25   GLUCOSE 91 92 133*  BUN 41* 36* 49*  CREATININE 2.22* 1.98* 2.19*  CALCIUM 8.6* 8.8* 8.4*      Recent Labs (within last 365 days)   Recent Labs  12/06/15 0710  AST 12*  ALT 10*  ALKPHOS 69  BILITOT 0.4  PROT 6.5  ALBUMIN 3.3*      Recent Labs (within last 365 days)   Recent Labs  12/06/15 0710 05/16/16 0720 07/14/16 1004  WBC 7.1 6.7 7.3  NEUTROABS 3.7  --   --   HGB 10.9* 11.3* 10.7*  HCT 33.6* 35.5* 33.5*  MCV 88.9 90.1 91.5  PLT 190 190 192     Recent Labs       Lab Results  Component Value Date   TSH 3.396 04/02/2016     Recent Labs       Lab Results  Component Value Date   HGBA1C 5.2 07/14/2016     Recent Labs       Lab Results  Component Value Date   CHOL 166 12/27/2015   HDL 44 12/27/2015   LDLCALC 114 (H) 12/27/2015   TRIG 39 12/27/2015   CHOLHDL 3.8 12/27/2015      Significant Diagnostic Results in last 30 days:  Imaging Results  No results found.    Assessment plan.  #1 history of dementia this appears stable I's on Risperdal every morning and at bedtime 0.5 mg also Xanax 0.25 mg twice a day she is followed by psychiatric services--I 1. behaviors were a significant issue but this again appears to have  stabilized.  #2 history of chronic kidney disease this appears stable with creatinine of 2.06 on lab done on 10/07/2016 at this point will monitor periodically she appears to be eating and drinking pretty well.  #3 anemia of chronic disease suspect secondary to chronic kidney disease this appears stable with a hemoglobin of 11 on most recent lab early February.  #4 history of hypertension she also on low-dose Lopressor-she does have variable blood pressures suspect some of this may be due to agitation blood pressure 120/60 today appears to be stable.  #5 history of UTIs she has completed course of  Cipro she has been afebrile does not appear to be overtly symptomatic at this point.  #6 history of facial lesion right cheek-we will monitor this for now for any changes.  [id number]

## 2016-10-21 DIAGNOSIS — F411 Generalized anxiety disorder: Secondary | ICD-10-CM | POA: Diagnosis not present

## 2016-10-21 DIAGNOSIS — F29 Unspecified psychosis not due to a substance or known physiological condition: Secondary | ICD-10-CM | POA: Diagnosis not present

## 2016-10-21 DIAGNOSIS — F0391 Unspecified dementia with behavioral disturbance: Secondary | ICD-10-CM | POA: Diagnosis not present

## 2016-11-26 DIAGNOSIS — M6281 Muscle weakness (generalized): Secondary | ICD-10-CM | POA: Diagnosis not present

## 2016-11-26 DIAGNOSIS — Z9181 History of falling: Secondary | ICD-10-CM | POA: Diagnosis not present

## 2016-11-26 DIAGNOSIS — F29 Unspecified psychosis not due to a substance or known physiological condition: Secondary | ICD-10-CM | POA: Diagnosis not present

## 2016-11-26 DIAGNOSIS — Z96642 Presence of left artificial hip joint: Secondary | ICD-10-CM | POA: Diagnosis not present

## 2016-11-26 DIAGNOSIS — R279 Unspecified lack of coordination: Secondary | ICD-10-CM | POA: Diagnosis not present

## 2016-11-26 DIAGNOSIS — F411 Generalized anxiety disorder: Secondary | ICD-10-CM | POA: Diagnosis not present

## 2016-11-26 DIAGNOSIS — F0391 Unspecified dementia with behavioral disturbance: Secondary | ICD-10-CM | POA: Diagnosis not present

## 2016-11-27 DIAGNOSIS — R279 Unspecified lack of coordination: Secondary | ICD-10-CM | POA: Diagnosis not present

## 2016-11-27 DIAGNOSIS — M6281 Muscle weakness (generalized): Secondary | ICD-10-CM | POA: Diagnosis not present

## 2016-11-27 DIAGNOSIS — Z9181 History of falling: Secondary | ICD-10-CM | POA: Diagnosis not present

## 2016-11-27 DIAGNOSIS — Z96642 Presence of left artificial hip joint: Secondary | ICD-10-CM | POA: Diagnosis not present

## 2016-11-28 DIAGNOSIS — M6281 Muscle weakness (generalized): Secondary | ICD-10-CM | POA: Diagnosis not present

## 2016-11-28 DIAGNOSIS — R279 Unspecified lack of coordination: Secondary | ICD-10-CM | POA: Diagnosis not present

## 2016-11-28 DIAGNOSIS — Z9181 History of falling: Secondary | ICD-10-CM | POA: Diagnosis not present

## 2016-11-28 DIAGNOSIS — Z96642 Presence of left artificial hip joint: Secondary | ICD-10-CM | POA: Diagnosis not present

## 2016-12-01 DIAGNOSIS — R279 Unspecified lack of coordination: Secondary | ICD-10-CM | POA: Diagnosis not present

## 2016-12-01 DIAGNOSIS — M6281 Muscle weakness (generalized): Secondary | ICD-10-CM | POA: Diagnosis not present

## 2016-12-01 DIAGNOSIS — Z96642 Presence of left artificial hip joint: Secondary | ICD-10-CM | POA: Diagnosis not present

## 2016-12-01 DIAGNOSIS — Z9181 History of falling: Secondary | ICD-10-CM | POA: Diagnosis not present

## 2016-12-03 DIAGNOSIS — Z96642 Presence of left artificial hip joint: Secondary | ICD-10-CM | POA: Diagnosis not present

## 2016-12-03 DIAGNOSIS — R279 Unspecified lack of coordination: Secondary | ICD-10-CM | POA: Diagnosis not present

## 2016-12-03 DIAGNOSIS — Z9181 History of falling: Secondary | ICD-10-CM | POA: Diagnosis not present

## 2016-12-03 DIAGNOSIS — M6281 Muscle weakness (generalized): Secondary | ICD-10-CM | POA: Diagnosis not present

## 2016-12-04 ENCOUNTER — Encounter: Payer: Self-pay | Admitting: Internal Medicine

## 2016-12-04 ENCOUNTER — Non-Acute Institutional Stay (SKILLED_NURSING_FACILITY): Payer: Medicare Other | Admitting: Internal Medicine

## 2016-12-04 DIAGNOSIS — D631 Anemia in chronic kidney disease: Secondary | ICD-10-CM | POA: Diagnosis not present

## 2016-12-04 DIAGNOSIS — N184 Chronic kidney disease, stage 4 (severe): Secondary | ICD-10-CM

## 2016-12-04 DIAGNOSIS — I10 Essential (primary) hypertension: Secondary | ICD-10-CM

## 2016-12-04 DIAGNOSIS — F0391 Unspecified dementia with behavioral disturbance: Secondary | ICD-10-CM | POA: Diagnosis not present

## 2016-12-04 NOTE — Progress Notes (Addendum)
Location:   [place] Room Number: 106/D Place of Service:  SNF (31) Provider:  Julieanne Cotton, MD (Inactive)  Patient Care Team: Marjean Donna, MD (Inactive) as PCP - General (Family Medicine) Virgie Dad, MD as Consulting Physician (Geriatric Medicine)  Extended Emergency Contact Information Primary Emergency Contact: Kanchan, Gal, [place] [address] [address] Litter of [place] Phone: [phone] Mobile Phone: [phone] Relation: Daughter Secondary Emergency Contact: Ciara, [place],  [address] [place] of [place] Phone: [phone] Relation: Son  Code Status:  DNR Goals of care: Advanced Directive information Advanced Directives 12/04/2016  Does Patient Have a Medical Advance Directive? Yes  Type of Advance Directive Out of facility DNR (pink MOST or yellow form)  Does patient want to make changes to medical advance directive? No - Patient declined  Copy of [place] in Chart? -  Pre-existing out of facility DNR order (yellow form or pink MOST form) -     Chief Complaint  Patient presents with  . Medical Management of Chronic Issues    Routine Visit    HPI:  Pt is a 83 y.o. female seen today for medical management of chronic diseases.    Patient has been long term resident of facility. She has h/o Hypertension Chronic renal disease , h/o femur fracture and Dementia with behavior disturbances. Patient was treated for UTI since I saw her  otherwise she has been doing well. Her behavior is controlled with Xanax and Risperdal. Her weight is up to  187 lbs from 185 lbs. Patient unable to give me any history but seems comfortable in bed. Per nurses she does not have any new issues. Past Medical History:  Diagnosis Date  . Alzheimer disease   . Anemia   . Anxiety   . Arthritis   . Hyperlipemia   . Hypertension   . Kidney calculus   . Pneumonia    Past Surgical History:    Procedure Laterality Date  . CHOLECYSTECTOMY    . HIP ARTHROPLASTY Left 11/18/2013   Procedure: ARTHROPLASTY BIPOLAR HIP;  Surgeon: Sanjuana Kava, MD;  Location: [facility];  Service: Orthopedics;  Laterality: Left;    Allergies  Allergen Reactions  . Sulfa Antibiotics Rash    Current Outpatient Prescriptions on File Prior to Visit  Medication Sig Dispense Refill  . ALPRAZolam (XANAX) 0.25 MG tablet Take one tablet by mouth twice daily for anxiety. Hold for sedation, Resp.Depression. 60 tablet 0  . aspirin EC 81 MG tablet Take 81 mg by mouth daily.    . calcium-vitamin D (OSCAL WITH D) 500-200 MG-UNIT tablet Take 1 tablet by mouth 2 (two) times daily.    Marland Kitchen Dextromethorphan-Guaifenesin (ROBITUSSIN COUGH+CHEST CONG DM) 5-100 MG/5ML LIQD Take 10 mLs by mouth every 6 (six) hours.    . metoprolol tartrate (LOPRESSOR) 25 MG tablet Take 12.5 mg by mouth 2 (two) times daily.     . risperiDONE (RISPERDAL) 1 MG/ML oral solution Take 0.5 mg by mouth at bedtime.      No current facility-administered medications on file prior to visit.      Review of Systems  Unable to perform ROS: Dementia    Immunization History  Administered Date(s) Administered  . Influenza-Unspecified 06/08/2014, 06/03/2016  . Pneumococcal-Unspecified 06/10/2016   Pertinent  Health Maintenance Due  Topic Date Due  . DEXA SCAN  06/17/2017 (Originally 06/08/1991)  .  INFLUENZA VACCINE  04/01/2017  . PNA vac Low Risk Adult (2 of 2 - PCV13) 06/10/2017   No flowsheet data found. Functional Status Survey:    Vitals:   12/04/16 1435  BP: (!) 143/53  Pulse: 77  Resp: 19  Temp: 98.4 F (36.9 C)  TempSrc: Oral   There is no height or weight on file to calculate BMI. Physical Exam  Constitutional: She appears well-developed and well-nourished.  HENT:  Head: Normocephalic.  Mouth/Throat: Oropharynx is clear and moist.  Eyes: Pupils are equal, round, and reactive to light.  Neck: Neck supple.  Cardiovascular: Normal  rate, regular rhythm and normal heart sounds.   Pulmonary/Chest: Effort normal and breath sounds normal. No respiratory distress. She has no wheezes.  Abdominal: Soft. Bowel sounds are normal. She exhibits no distension. There is no tenderness. There is no rebound.  Musculoskeletal: She exhibits no edema.  Neurological: She is alert.  Not oriented. Does not follow any commands.  Skin: Skin is warm and dry.  Psychiatric: She has a normal mood and affect. Her behavior is normal.    Labs reviewed:  Recent Labs  07/14/16 1004 09/11/16 0500 10/07/16 0930  NA 142 139 138  K 4.2 3.8 3.9  CL 110 109 108  CO2 25 25 24   GLUCOSE 133* 91 201*  BUN 49* 46* 41*  CREATININE 2.19* 1.77* 2.06*  CALCIUM 8.4* 8.6* 8.3*    Recent Labs  12/06/15 0710 09/11/16 0500 10/07/16 0930  AST 12* 12* 14*  ALT 10* 11* 11*  ALKPHOS 69 74 72  BILITOT 0.4 0.3 0.5  PROT 6.5 6.3* 5.8*  ALBUMIN 3.3* 3.1* 2.9*    Recent Labs  12/06/15 0710  07/14/16 1004 09/11/16 0500 10/07/16 0930  WBC 7.1  < > 7.3 6.3 6.2  NEUTROABS 3.7  --   --  3.7 3.6  HGB 10.9*  < > 10.7* 10.9* 11.0*  HCT 33.6*  < > 33.5* 33.5* 34.2*  MCV 88.9  < > 91.5 88.6 89.8  PLT 190  < > 192 198 187  < > = values in this interval not displayed. Lab Results  Component Value Date   TSH 3.396 04/02/2016   Lab Results  Component Value Date   HGBA1C 5.2 07/14/2016   Lab Results  Component Value Date   CHOL 166 12/27/2015   HDL 44 12/27/2015   LDLCALC 114 (H) 12/27/2015   TRIG 39 12/27/2015   CHOLHDL 3.8 12/27/2015    Significant Diagnostic Results in last 30 days:  No results found.  Assessment/Plan  Essential hypertension BP slightly elevated today but mostly well controlled. No change in her Meds.  Anemia in stage 4 chronic kidney disease  Hgb Stable  CKD (chronic kidney disease), stage IV Creat stable.Patient has gained some weight but she does not seem to have any volume overload.  Health maintenance Since  patient has dementia with Behavior problems and her frailty she is not candidate for anything aggressive.  Dementia with behavioral disturbance,  On Xanax and Risperdal PRN. Patient stable   Family/ staff Communication:   Labs/tests ordered:

## 2016-12-05 DIAGNOSIS — Z96642 Presence of left artificial hip joint: Secondary | ICD-10-CM | POA: Diagnosis not present

## 2016-12-05 DIAGNOSIS — M6281 Muscle weakness (generalized): Secondary | ICD-10-CM | POA: Diagnosis not present

## 2016-12-05 DIAGNOSIS — R279 Unspecified lack of coordination: Secondary | ICD-10-CM | POA: Diagnosis not present

## 2016-12-05 DIAGNOSIS — Z9181 History of falling: Secondary | ICD-10-CM | POA: Diagnosis not present

## 2016-12-07 DIAGNOSIS — R0989 Other specified symptoms and signs involving the circulatory and respiratory systems: Secondary | ICD-10-CM | POA: Diagnosis not present

## 2016-12-08 ENCOUNTER — Encounter (HOSPITAL_COMMUNITY)
Admission: RE | Admit: 2016-12-08 | Discharge: 2016-12-08 | Disposition: A | Discharge status: Home / Self Care | Payer: Medicare Other | Source: Skilled Nursing Facility | Attending: Internal Medicine | Admitting: Internal Medicine

## 2016-12-08 ENCOUNTER — Non-Acute Institutional Stay (SKILLED_NURSING_FACILITY): Payer: Medicare Other | Admitting: Internal Medicine

## 2016-12-08 ENCOUNTER — Encounter: Payer: Self-pay | Admitting: Internal Medicine

## 2016-12-08 DIAGNOSIS — Z9181 History of falling: Secondary | ICD-10-CM | POA: Insufficient documentation

## 2016-12-08 DIAGNOSIS — R55 Syncope and collapse: Secondary | ICD-10-CM

## 2016-12-08 DIAGNOSIS — N184 Chronic kidney disease, stage 4 (severe): Secondary | ICD-10-CM

## 2016-12-08 DIAGNOSIS — R112 Nausea with vomiting, unspecified: Secondary | ICD-10-CM | POA: Diagnosis not present

## 2016-12-08 DIAGNOSIS — F29 Unspecified psychosis not due to a substance or known physiological condition: Secondary | ICD-10-CM | POA: Diagnosis present

## 2016-12-08 DIAGNOSIS — I129 Hypertensive chronic kidney disease with stage 1 through stage 4 chronic kidney disease, or unspecified chronic kidney disease: Secondary | ICD-10-CM | POA: Insufficient documentation

## 2016-12-08 DIAGNOSIS — F0391 Unspecified dementia with behavioral disturbance: Secondary | ICD-10-CM | POA: Diagnosis not present

## 2016-12-08 LAB — CBC WITH DIFFERENTIAL/PLATELET
BASOS ABS: 0 10*3/uL (ref 0.0–0.1)
Basophils Relative: 0 %
EOS PCT: 1 %
Eosinophils Absolute: 0.1 10*3/uL (ref 0.0–0.7)
HCT: 34.9 % — ABNORMAL LOW (ref 36.0–46.0)
Hemoglobin: 11.2 g/dL — ABNORMAL LOW (ref 12.0–15.0)
Lymphocytes Relative: 22 %
Lymphs Abs: 2.1 10*3/uL (ref 0.7–4.0)
MCH: 28.5 pg (ref 26.0–34.0)
MCHC: 32.1 g/dL (ref 30.0–36.0)
MCV: 88.8 fL (ref 78.0–100.0)
MONO ABS: 0.9 10*3/uL (ref 0.1–1.0)
Monocytes Relative: 9 %
NEUTROS ABS: 6.6 10*3/uL (ref 1.7–7.7)
Neutrophils Relative %: 68 %
PLATELETS: 189 10*3/uL (ref 150–400)
RBC: 3.93 MIL/uL (ref 3.87–5.11)
RDW: 14 % (ref 11.5–15.5)
WBC: 9.6 10*3/uL (ref 4.0–10.5)

## 2016-12-08 LAB — COMPREHENSIVE METABOLIC PANEL
ALBUMIN: 2.9 g/dL — AB (ref 3.5–5.0)
ALT: 12 U/L — ABNORMAL LOW (ref 14–54)
ANION GAP: 9 (ref 5–15)
AST: 13 U/L — ABNORMAL LOW (ref 15–41)
Alkaline Phosphatase: 68 U/L (ref 38–126)
BUN: 51 mg/dL — ABNORMAL HIGH (ref 6–20)
CO2: 23 mmol/L (ref 22–32)
Calcium: 8.4 mg/dL — ABNORMAL LOW (ref 8.9–10.3)
Chloride: 109 mmol/L (ref 101–111)
Creatinine, Ser: 2.2 mg/dL — ABNORMAL HIGH (ref 0.44–1.00)
GFR calc Af Amer: 21 mL/min — ABNORMAL LOW (ref >60)
GFR calc non Af Amer: 19 mL/min — ABNORMAL LOW (ref >60)
GLUCOSE: 95 mg/dL (ref 65–99)
POTASSIUM: 4.1 mmol/L (ref 3.5–5.1)
SODIUM: 141 mmol/L (ref 135–145)
Total Bilirubin: 0.4 mg/dL (ref 0.3–1.2)
Total Protein: 6.2 g/dL — ABNORMAL LOW (ref 6.5–8.1)

## 2016-12-08 LAB — URINALYSIS, ROUTINE W REFLEX MICROSCOPIC
BILIRUBIN URINE: NEGATIVE
Glucose, UA: 50 mg/dL — AB
Ketones, ur: NEGATIVE mg/dL
Nitrite: POSITIVE — AB
PH: 5 (ref 5.0–8.0)
Protein, ur: 30 mg/dL — AB
SPECIFIC GRAVITY, URINE: 1.018 (ref 1.005–1.030)

## 2016-12-08 LAB — TSH: TSH: 1.708 u[IU]/mL (ref 0.350–4.500)

## 2016-12-08 NOTE — Progress Notes (Signed)
Location:   [place] Room Number: 106/D Place of Service:  SNF (31) Provider:  Julieanne Cotton, MD (Inactive)  Patient Care Team: Marjean Donna, MD (Inactive) as PCP - General (Family Medicine) Virgie Dad, MD as Consulting Physician (Geriatric Medicine)  Extended Emergency Contact Information Primary Emergency Contact: Anne-Marie, [place], [place] [address] [address] Litter of [place] Phone: [phone] Mobile Phone: [phone] Relation: Daughter Secondary Emergency Contact: Laron, Angelini, [place] [address] [place] of [place] Phone: [phone] Relation: Son  Code Status:  DNR Goals of care: Advanced Directive information Advanced Directives 12/08/2016  Does Patient Have a Medical Advance Directive? Yes  Type of Advance Directive Out of facility DNR (pink MOST or yellow form)  Does patient want to make changes to medical advance directive? No - Patient declined  Copy of [place] in Chart? -  Pre-existing out of facility DNR order (yellow form or pink MOST form) -     Chief Complaint  Patient presents with  . Acute Visit    Was found to be unresponsive yesterday    HPI:  Pt is a 83 y.o. female seen today for an acute visit for Follow up of episode in which patient was found Unresponsive.  Patient has been long term resident of facility. She has h/o Hypertension Chronic renal disease , h/o femur fracture and Dementia with behavior disturbances.  Patient had this episode yesterday in which it seems she was not responding to the Nursing staff Her vitals were normal and she woke up very fast. She did have one episdoe of Vomiting after this syncope. She is fine this morning. Ate her breakfast and back to baseline. No more episode of Syncope since then. Patient unable to give me any history. D/W Nurse and they say she has done this before and her daughter does not want anything aggressive due to  her dementia and Frailty. Patient sitting in wheel chair and very alert and talkative today.   Past Medical History:  Diagnosis Date  . Alzheimer disease   . Anemia   . Anxiety   . Arthritis   . Hyperlipemia   . Hypertension   . Kidney calculus   . Pneumonia    Past Surgical History:  Procedure Laterality Date  . CHOLECYSTECTOMY    . HIP ARTHROPLASTY Left 11/18/2013   Procedure: ARTHROPLASTY BIPOLAR HIP;  Surgeon: Sanjuana Kava, MD;  Location: [facility];  Service: Orthopedics;  Laterality: Left;    Allergies  Allergen Reactions  . Sulfa Antibiotics Rash    Current Outpatient Prescriptions on File Prior to Visit  Medication Sig Dispense Refill  . ALPRAZolam (XANAX) 0.25 MG tablet Take one tablet by mouth twice daily for anxiety. Hold for sedation, Resp.Depression. 60 tablet 0  . aspirin EC 81 MG tablet Take 81 mg by mouth daily.    . calcium-vitamin D (OSCAL WITH D) 500-200 MG-UNIT tablet Take 1 tablet by mouth 2 (two) times daily.    Marland Kitchen Dextromethorphan-Guaifenesin (ROBITUSSIN COUGH+CHEST CONG DM) 5-100 MG/5ML LIQD Take 10 mLs by mouth every 6 (six) hours as needed.     . metoprolol tartrate (LOPRESSOR) 25 MG tablet Take 12.5 mg by mouth 2 (two) times daily.     . risperiDONE (RISPERDAL) 0.25 MG tablet Take 0.25 mg by mouth daily.    . risperiDONE (RISPERDAL) 1 MG/ML oral solution Take 0.5 mg by  mouth at bedtime.      No current facility-administered medications on file prior to visit.      Review of Systems  Unable to perform ROS: Dementia    Immunization History  Administered Date(s) Administered  . Influenza-Unspecified 06/08/2014, 06/03/2016  . Pneumococcal-Unspecified 06/10/2016   Pertinent  Health Maintenance Due  Topic Date Due  . DEXA SCAN  06/17/2017 (Originally 06/08/1991)  . INFLUENZA VACCINE  04/01/2017  . PNA vac Low Risk Adult (2 of 2 - PCV13) 06/10/2017   No flowsheet data found. Functional Status Survey:    Vitals:   12/08/16 1025  BP: (!)  107/53  Pulse: 67  Resp: 20  Temp: 98.2 F (36.8 C)  TempSrc: Oral  SpO2: 98%   There is no height or weight on file to calculate BMI. Physical Exam  Constitutional: She appears well-developed and well-nourished.  HENT:  Head: Normocephalic.  Mouth/Throat: Oropharynx is clear and moist.  Eyes: Pupils are equal, round, and reactive to light.  Neck: Neck supple.  Cardiovascular: Normal rate, regular rhythm and normal heart sounds.   Pulmonary/Chest: Effort normal and breath sounds normal. No respiratory distress. She has no wheezes. She has no rales.  Abdominal: Soft. Bowel sounds are normal. She exhibits no distension. There is no tenderness. There is no rebound.  Musculoskeletal: She exhibits edema.  Neurological: She is alert.  Skin: Skin is warm and dry. No rash noted. No erythema. No pallor.    Labs reviewed:  Recent Labs  09/11/16 0500 10/07/16 0930 12/08/16 0700  NA 139 138 141  K 3.8 3.9 4.1  CL 109 108 109  CO2 25 24 23   GLUCOSE 91 201* 95  BUN 46* 41* 51*  CREATININE 1.77* 2.06* 2.20*  CALCIUM 8.6* 8.3* 8.4*    Recent Labs  09/11/16 0500 10/07/16 0930 12/08/16 0700  AST 12* 14* 13*  ALT 11* 11* 12*  ALKPHOS 74 72 68  BILITOT 0.3 0.5 0.4  PROT 6.3* 5.8* 6.2*  ALBUMIN 3.1* 2.9* 2.9*    Recent Labs  09/11/16 0500 10/07/16 0930 12/08/16 0700  WBC 6.3 6.2 9.6  NEUTROABS 3.7 3.6 6.6  HGB 10.9* 11.0* 11.2*  HCT 33.5* 34.2* 34.9*  MCV 88.6 89.8 88.8  PLT 198 187 189   Lab Results  Component Value Date   TSH 1.708 12/08/2016   Lab Results  Component Value Date   HGBA1C 5.2 07/14/2016   Lab Results  Component Value Date   CHOL 166 12/27/2015   HDL 44 12/27/2015   LDLCALC 114 (H) 12/27/2015   TRIG 39 12/27/2015   CHOLHDL 3.8 12/27/2015    Significant Diagnostic Results in last 30 days:  No results found.  Assessment/Plan  Syncope Patient has had these episodes before. Now back to baseline. Vitals stable. With her age do  Not think  she is candidate for anything aggressive. Her labs did not show anything acute.CXray was negative.  Nausea and vomiting Resolved with Zofran. Will check UA  Dementia with behavioral disturbance,  Back to baseline now.  CKD (chronic kidney disease), stage IV  Some worsening of creat. Will repeat labs in 1 week.    Family/ staff Communication:   Labs/tests ordered:  UA and BMP

## 2016-12-11 ENCOUNTER — Encounter: Payer: Self-pay | Admitting: Internal Medicine

## 2016-12-11 ENCOUNTER — Non-Acute Institutional Stay (SKILLED_NURSING_FACILITY): Payer: Medicare Other | Admitting: Internal Medicine

## 2016-12-11 DIAGNOSIS — N184 Chronic kidney disease, stage 4 (severe): Secondary | ICD-10-CM

## 2016-12-11 DIAGNOSIS — N390 Urinary tract infection, site not specified: Secondary | ICD-10-CM

## 2016-12-11 DIAGNOSIS — F0391 Unspecified dementia with behavioral disturbance: Secondary | ICD-10-CM

## 2016-12-11 HISTORY — DX: Urinary tract infection, site not specified: N39.0

## 2016-12-11 LAB — URINE CULTURE: Culture: >=100000 — AB

## 2016-12-11 NOTE — Progress Notes (Signed)
Location:   [place] Room Number: 106/D Place of Service:  SNF (31) Provider:  Clydene Fake, MD  Patient Care Team: Virgie Dad, MD as PCP - General (Internal Medicine) Virgie Dad, MD as Consulting Physician (Geriatric Medicine) Wille Celeste, PA-C as Physician Assistant (Internal Medicine)  Extended Emergency Contact Information Primary Emergency Contact: Riniyah, [place], [place] [address] [address] Litter of [place] Phone: [phone] Mobile Phone: [phone] Relation: Daughter Secondary Emergency Contact: Tequisha, [place], [place] [address] [place] of [place] Phone: [phone] Relation: Son  Code Status:  DNR Goals of care: Advanced Directive information Advanced Directives 12/11/2016  Does Patient Have a Medical Advance Directive? Yes  Type of Advance Directive Out of facility DNR (pink MOST or yellow form)  Does patient want to make changes to medical advance directive? No - Patient declined  Copy of [place] in Chart? -  Pre-existing out of facility DNR order (yellow form or pink MOST form) -     Chief Complaint  Patient presents with  . Acute Visit    UTI    HPI:  Pt is a 83 y.o. female seen today for an acute visit for UTI. Patient with h/o Hypertension, CKD, And dementia had an episode of Nausea , vomiting and syncope. Per her family nothing aggressive was done. But UA was done to rule out Sub clinical UTI. Her Urine Grew More then 100,000 Colonies of MRSA. Patient stayed asymptomatic with no fever or chills. She is uable to give any history but she is eating well with no nausea and vomiting and seems in no acute distress.    Past Medical History:  Diagnosis Date  . Alzheimer disease   . Anemia   . Anxiety   . Arthritis   . Hyperlipemia   . Hypertension   . Kidney calculus   . Pneumonia    Past Surgical History:  Procedure Laterality Date  . CHOLECYSTECTOMY     . HIP ARTHROPLASTY Left 11/18/2013   Procedure: ARTHROPLASTY BIPOLAR HIP;  Surgeon: Sanjuana Kava, MD;  Location: [facility];  Service: Orthopedics;  Laterality: Left;    Allergies  Allergen Reactions  . Sulfa Antibiotics Rash    Outpatient Encounter Prescriptions as of 12/11/2016  Medication Sig  . ALPRAZolam (XANAX) 0.25 MG tablet Take one tablet by mouth twice daily for anxiety. Hold for sedation, Resp.Depression.  Marland Kitchen aspirin EC 81 MG tablet Take 81 mg by mouth daily.  . calcium-vitamin D (OSCAL WITH D) 500-200 MG-UNIT tablet Take 1 tablet by mouth 2 (two) times daily.  Marland Kitchen Dextromethorphan-Guaifenesin (ROBITUSSIN COUGH+CHEST CONG DM) 5-100 MG/5ML LIQD Take 10 mLs by mouth every 6 (six) hours as needed.   . metoprolol tartrate (LOPRESSOR) 25 MG tablet Take 12.5 mg by mouth 2 (two) times daily.   . ondansetron (ZOFRAN-ODT) 4 MG disintegrating tablet Take 4 mg by mouth every 4 (four) hours as needed for nausea or vomiting.  . risperiDONE (RISPERDAL) 0.25 MG tablet Take 0.25 mg by mouth daily.  . risperiDONE (RISPERDAL) 1 MG/ML oral solution Take 0.5 mg by mouth at bedtime.    No facility-administered encounter medications on file as of 12/11/2016.     Review of Systems  Unable to perform ROS: Dementia    Immunization History  Administered Date(s) Administered  . Influenza-Unspecified 06/08/2014, 06/03/2016  . Pneumococcal-Unspecified 06/10/2016   Pertinent  Health Maintenance Due  Topic Date Due  . DEXA SCAN  06/17/2017 (Originally 06/08/1991)  . INFLUENZA VACCINE  04/01/2017  . PNA vac Low Risk Adult (2 of 2 - PCV13) 06/10/2017   No flowsheet data found. Functional Status Survey:    Vitals:   12/11/16 1120  BP: 128/76  Pulse: 66  Resp: 20  Temp: 98.2 F (36.8 C)  TempSrc: Oral  SpO2: 96%   There is no height or weight on file to calculate BMI. Physical Exam  Constitutional: She appears well-developed and well-nourished.  HENT:  Head: Normocephalic.  Abdominal: Soft.  Bowel sounds are normal. She exhibits no distension. There is no tenderness. There is no rebound and no guarding.    Labs reviewed:  Recent Labs  09/11/16 0500 10/07/16 0930 12/08/16 0700  NA 139 138 141  K 3.8 3.9 4.1  CL 109 108 109  CO2 25 24 23   GLUCOSE 91 201* 95  BUN 46* 41* 51*  CREATININE 1.77* 2.06* 2.20*  CALCIUM 8.6* 8.3* 8.4*    Recent Labs  09/11/16 0500 10/07/16 0930 12/08/16 0700  AST 12* 14* 13*  ALT 11* 11* 12*  ALKPHOS 74 72 68  BILITOT 0.3 0.5 0.4  PROT 6.3* 5.8* 6.2*  ALBUMIN 3.1* 2.9* 2.9*    Recent Labs  09/11/16 0500 10/07/16 0930 12/08/16 0700  WBC 6.3 6.2 9.6  NEUTROABS 3.7 3.6 6.6  HGB 10.9* 11.0* 11.2*  HCT 33.5* 34.2* 34.9*  MCV 88.6 89.8 88.8  PLT 198 187 189   Lab Results  Component Value Date   TSH 1.708 12/08/2016   Lab Results  Component Value Date   HGBA1C 5.2 07/14/2016   Lab Results  Component Value Date   CHOL 166 12/27/2015   HDL 44 12/27/2015   LDLCALC 114 (H) 12/27/2015   TRIG 39 12/27/2015   CHOLHDL 3.8 12/27/2015    Significant Diagnostic Results in last 30 days:  No results found.  Assessment/Plan Uncomplicated UTI with MRSA Patient now in Contact Isolation Will start her on Nitrofurantoin 100 mg BID for 7 days.  CKD (chronic kidney disease), stage IV  Repeat creat pending. Patient taking PO well.  Dementia with behavioral disturbance,  Continue Supportive care.      Family/ staff Communication:   Labs/tests ordered:

## 2016-12-12 DIAGNOSIS — R279 Unspecified lack of coordination: Secondary | ICD-10-CM | POA: Diagnosis not present

## 2016-12-12 DIAGNOSIS — Z9181 History of falling: Secondary | ICD-10-CM | POA: Diagnosis not present

## 2016-12-12 DIAGNOSIS — Z96642 Presence of left artificial hip joint: Secondary | ICD-10-CM | POA: Diagnosis not present

## 2016-12-12 DIAGNOSIS — M6281 Muscle weakness (generalized): Secondary | ICD-10-CM | POA: Diagnosis not present

## 2016-12-15 ENCOUNTER — Encounter (HOSPITAL_COMMUNITY)
Admission: RE | Admit: 2016-12-15 | Discharge: 2016-12-15 | Disposition: A | Discharge status: Home / Self Care | Payer: Medicare Other | Source: Skilled Nursing Facility | Attending: *Deleted | Admitting: *Deleted

## 2016-12-15 DIAGNOSIS — N184 Chronic kidney disease, stage 4 (severe): Secondary | ICD-10-CM | POA: Diagnosis not present

## 2016-12-15 DIAGNOSIS — F29 Unspecified psychosis not due to a substance or known physiological condition: Secondary | ICD-10-CM | POA: Diagnosis not present

## 2016-12-15 DIAGNOSIS — Z9181 History of falling: Secondary | ICD-10-CM | POA: Diagnosis not present

## 2016-12-15 DIAGNOSIS — Z96642 Presence of left artificial hip joint: Secondary | ICD-10-CM | POA: Diagnosis not present

## 2016-12-15 DIAGNOSIS — I129 Hypertensive chronic kidney disease with stage 1 through stage 4 chronic kidney disease, or unspecified chronic kidney disease: Secondary | ICD-10-CM | POA: Diagnosis not present

## 2016-12-15 DIAGNOSIS — M6281 Muscle weakness (generalized): Secondary | ICD-10-CM | POA: Diagnosis not present

## 2016-12-15 DIAGNOSIS — R279 Unspecified lack of coordination: Secondary | ICD-10-CM | POA: Diagnosis not present

## 2016-12-15 LAB — BASIC METABOLIC PANEL
Anion gap: 7 (ref 5–15)
BUN: 35 mg/dL — AB (ref 6–20)
CALCIUM: 8.8 mg/dL — AB (ref 8.9–10.3)
CO2: 27 mmol/L (ref 22–32)
CREATININE: 1.89 mg/dL — AB (ref 0.44–1.00)
Chloride: 107 mmol/L (ref 101–111)
GFR calc Af Amer: 26 mL/min — ABNORMAL LOW (ref >60)
GFR calc non Af Amer: 22 mL/min — ABNORMAL LOW (ref >60)
GLUCOSE: 91 mg/dL (ref 65–99)
Potassium: 3.9 mmol/L (ref 3.5–5.1)
Sodium: 141 mmol/L (ref 135–145)

## 2017-01-05 ENCOUNTER — Other Ambulatory Visit: Payer: Self-pay | Admitting: *Deleted

## 2017-01-05 ENCOUNTER — Non-Acute Institutional Stay (SKILLED_NURSING_FACILITY): Payer: Medicare Other | Admitting: Internal Medicine

## 2017-01-05 ENCOUNTER — Encounter: Payer: Self-pay | Admitting: Internal Medicine

## 2017-01-05 DIAGNOSIS — F0391 Unspecified dementia with behavioral disturbance: Secondary | ICD-10-CM

## 2017-01-05 DIAGNOSIS — N390 Urinary tract infection, site not specified: Secondary | ICD-10-CM | POA: Diagnosis not present

## 2017-01-05 DIAGNOSIS — I10 Essential (primary) hypertension: Secondary | ICD-10-CM

## 2017-01-05 DIAGNOSIS — N184 Chronic kidney disease, stage 4 (severe): Secondary | ICD-10-CM

## 2017-01-05 DIAGNOSIS — D631 Anemia in chronic kidney disease: Secondary | ICD-10-CM

## 2017-01-05 MED ORDER — ALPRAZOLAM 0.25 MG PO TABS: ORAL_TABLET | ORAL | 0 refills | Status: DC

## 2017-01-05 NOTE — Telephone Encounter (Signed)
[facility] #[phone] Fax: [phone]   

## 2017-01-05 NOTE — Progress Notes (Signed)
Location:   [facility] Room Number: 106/D Place of Service:  SNF (31) Provider:  Gardiner Fanti, Rene Kocher, MD  Patient Care Team: Virgie Dad, MD as PCP - General (Internal Medicine) Virgie Dad, MD as Consulting Physician (Geriatric Medicine) Rolm Baptise as Physician Assistant (Internal Medicine)  Extended Emergency Contact Information Primary Emergency Contact: Jasara, [place], [place] [address] [address]itter of [place] Phone: [phone] Mobile Phone: [phone] Relation: Daughter Secondary Emergency Contact: Kanoelani, [place],  [address] [place] of [place] Phone: [phone] Relation: Son  Code Status:  DNR Goals of care: Advanced Directive information Advanced Directives 01/05/2017  Does Patient Have a Medical Advance Directive? Yes  Type of Advance Directive Out of facility DNR (pink MOST or yellow form)  Does patient want to make changes to medical advance directive? No - Patient declined  Copy of [place] in Chart? -  Pre-existing out of facility DNR order (yellow form or pink MOST form) -     Chief Complaint  Patient presents with  . Medical Management of Chronic Issues    Routine Visit  Per medical management of chronic medical conditions including dementia hypertension chronic renal disease also recent UTI  HPI:  Pt is a [name] seen today for medical management of chronic diseases. As noted above she does have a history of significant dementia and family does not wish aggressive treatment or hospitalization.  Nonetheless she appears to be stable she is seen by psychiatric services and now is on respiratory no twice a day which appears to be quite effective she also has Xanax twice a day to control anxiety and this appears again to have stabilized-at one point this had been quite an issue but has been reasonably stable now for an extended period of time.  She  also has a history of chronic kidney disease most recent creatinine of 1.89 actually appears improved from her baseline which has been in the lower twos.  She also recently completed a course of doxycycline for MRSA UTI she was not overtly symptomatic care.  Her weight continues to be stable at 185 pounds.  Her blood pressures 1 nursing has been able to obtain M has been satisfactory she is on Lopressor 12.5 mg twice a day her rate today was 64.  Currently she is resting in bed comfortably she is being fed her dinner appears to be doing well with this apparently she has a good appetite    Past Medical History:  Diagnosis Date  . Alzheimer disease   . Anemia   . Anxiety   . Arthritis   . Hyperlipemia   . Hypertension   . Kidney calculus   . Pneumonia    Past Surgical History:  Procedure Laterality Date  . CHOLECYSTECTOMY    . HIP ARTHROPLASTY Left 11/18/2013   Procedure: ARTHROPLASTY BIPOLAR HIP;  Surgeon: Sanjuana Kava, MD;  Location: [facility];  Service: Orthopedics;  Laterality: Left;    Allergies  Allergen Reactions  . Sulfa Antibiotics Rash    Outpatient Encounter Prescriptions as of 01/05/2017  Medication Sig  . ALPRAZolam (XANAX) 0.25 MG tablet Take one tablet by mouth twice daily for anxiety. Hold for sedation  . aspirin EC 81 MG tablet Take 81 mg by mouth daily.  . calcium-vitamin D (OSCAL WITH D) 500-200 MG-UNIT tablet Take 1 tablet by mouth  2 (two) times daily.  Marland Kitchen Dextromethorphan-Guaifenesin (ROBITUSSIN COUGH+CHEST CONG DM) 5-100 MG/5ML LIQD Take 10 mLs by mouth every 6 (six) hours as needed.   . metoprolol tartrate (LOPRESSOR) 25 MG tablet Take 12.5 mg by mouth 2 (two) times daily.   . ondansetron (ZOFRAN-ODT) 4 MG disintegrating tablet Take 4 mg by mouth every 4 (four) hours as needed for nausea or vomiting.  . risperiDONE (RISPERDAL) 0.25 MG tablet Take 0.25 mg by mouth daily.  . risperiDONE (RISPERDAL) 1 MG/ML oral solution Take 0.5 mg by mouth at bedtime.    No  facility-administered encounter medications on file as of 01/05/2017.      Review of Systems  Unattainable secondary to dementia please see history of present illness  Immunization History  Administered Date(s) Administered  . Influenza-Unspecified 06/08/2014, 06/03/2016  . Pneumococcal-Unspecified 06/10/2016   Pertinent  Health Maintenance Due  Topic Date Due  . DEXA SCAN  06/17/2017 (Originally 06/08/1991)  . INFLUENZA VACCINE  04/01/2017  . PNA vac Low Risk Adult (2 of 2 - PCV13) 06/10/2017   No flowsheet data found. Functional Status Survey:   He is afebrile pulse of 64 respirations 18 blood pressure taken manually 140/Lorraine Weight is 185.2  Physical Exam Constitutional: She appears well-developedand well-nourished. Lying comfortably in bed. Being fed her dinner  Her skin is warm and dry he does have a history of a small right facial lesion but this appears to have resolved I do not see today HENT:  Head: Normocephalic.  Mouth/Throat: Oropharynx is clear and moist.  Eyes: Pupils are equal, round, and reactive to light.  Neck: Neck supple.  Cardiovascular: Normal rate, regular rhythmand normal heart sounds.  Pulmonary/Chest: Effort normaland breath sounds normal. No respiratory distress. She has no wheezes. She has no rales.  Abdominal: Soft. Bowel sounds are normal. She exhibits no distension. There is no tenderness. There is no rebound.  Musculoskeletal:  B/L edema which appears baseline fairly mild as is able to move all extremities 4 strength appears to be intact all 4 extremities  Neurological: She is alert.  Not oriented. Does not follow any commandsbut did allow physical exam today without agitation  Labs reviewed:  Recent Labs  10/07/16 0930 12/08/16 0700 12/15/16 0700  NA 138 141 141  K 3.9 4.1 3.9  CL 108 109 107  CO2 24 23 27   GLUCOSE 201* 95 91  BUN 41* 51* 35*  CREATININE 2.06* 2.20* 1.89*  CALCIUM 8.3* 8.4* 8.8*    Recent Labs   09/11/16 0500 10/07/16 0930 12/08/16 0700  AST 12* 14* 13*  ALT 11* 11* 12*  ALKPHOS 74 72 68  BILITOT 0.3 0.5 0.4  PROT 6.3* 5.8* 6.2*  ALBUMIN 3.1* 2.9* 2.9*    Recent Labs  09/11/16 0500 10/07/16 0930 12/08/16 0700  WBC 6.3 6.2 9.6  NEUTROABS 3.7 3.6 6.6  HGB 10.9* 11.0* 11.2*  HCT 33.5* 34.2* 34.9*  MCV 88.6 89.8 88.8  PLT 198 187 189   Lab Results  Component Value Date   TSH 1.708 12/08/2016   Lab Results  Component Value Date   HGBA1C 5.2 07/14/2016   Lab Results  Component Value Date   CHOL 166 12/27/2015   HDL 44 12/27/2015   LDLCALC 114 (H) 12/27/2015   TRIG 39 12/27/2015   CHOLHDL 3.8 12/27/2015    Significant Diagnostic Results in last 30 days:  No results found.  Assessment/Plan   #1 history of dementia this appears stable on Risperdal twice a day as well  as Xanax 0.25 mg twice a day-she is followed by psychiatric services-this appears to have stabilized.  Her weight is stable at around 185.  #2 history of chronic kidney disease this appears stable as well with a creatinine of 1.89 on lab done on 12/08/2016-will monitor periodically at times blood draws are difficult secondary to agitation.  #3 anemia of chronic disease suspicion this is secondary to her chronic kidney disease hemoglobin appears stable at 11.2 on lab done again April 2018   #4 history hypertension this has been fairly stable blood pressure today 140/Lorraine taken manually she is on low-dose Lopressor 12.5 mg twice a day. Obtaining her blood pressure at times is difficult secondary to patient's agitation  #5 history of UTIs again she's completed a course of doxycycline for MRSA she appears to be asymptomatic at this time.  #6 history of right facial lesion this appears to have resolved I could not really note that today on exam at this point will monitor  [phone]

## 2017-01-06 DIAGNOSIS — B351 Tinea unguium: Secondary | ICD-10-CM | POA: Diagnosis not present

## 2017-01-06 DIAGNOSIS — I739 Peripheral vascular disease, unspecified: Secondary | ICD-10-CM | POA: Diagnosis not present

## 2017-01-14 DIAGNOSIS — Z9181 History of falling: Secondary | ICD-10-CM | POA: Diagnosis not present

## 2017-01-14 DIAGNOSIS — R279 Unspecified lack of coordination: Secondary | ICD-10-CM | POA: Diagnosis not present

## 2017-01-14 DIAGNOSIS — M6281 Muscle weakness (generalized): Secondary | ICD-10-CM | POA: Diagnosis not present

## 2017-01-14 DIAGNOSIS — Z96642 Presence of left artificial hip joint: Secondary | ICD-10-CM | POA: Diagnosis not present

## 2017-01-19 DIAGNOSIS — Z96642 Presence of left artificial hip joint: Secondary | ICD-10-CM | POA: Diagnosis not present

## 2017-01-19 DIAGNOSIS — Z9181 History of falling: Secondary | ICD-10-CM | POA: Diagnosis not present

## 2017-01-19 DIAGNOSIS — M6281 Muscle weakness (generalized): Secondary | ICD-10-CM | POA: Diagnosis not present

## 2017-01-19 DIAGNOSIS — R279 Unspecified lack of coordination: Secondary | ICD-10-CM | POA: Diagnosis not present

## 2017-01-20 DIAGNOSIS — M6281 Muscle weakness (generalized): Secondary | ICD-10-CM | POA: Diagnosis not present

## 2017-01-20 DIAGNOSIS — Z96642 Presence of left artificial hip joint: Secondary | ICD-10-CM | POA: Diagnosis not present

## 2017-01-20 DIAGNOSIS — Z9181 History of falling: Secondary | ICD-10-CM | POA: Diagnosis not present

## 2017-01-20 DIAGNOSIS — R279 Unspecified lack of coordination: Secondary | ICD-10-CM | POA: Diagnosis not present

## 2017-01-21 DIAGNOSIS — Z96642 Presence of left artificial hip joint: Secondary | ICD-10-CM | POA: Diagnosis not present

## 2017-01-21 DIAGNOSIS — R279 Unspecified lack of coordination: Secondary | ICD-10-CM | POA: Diagnosis not present

## 2017-01-21 DIAGNOSIS — M6281 Muscle weakness (generalized): Secondary | ICD-10-CM | POA: Diagnosis not present

## 2017-01-21 DIAGNOSIS — Z9181 History of falling: Secondary | ICD-10-CM | POA: Diagnosis not present

## 2017-01-22 DIAGNOSIS — M6281 Muscle weakness (generalized): Secondary | ICD-10-CM | POA: Diagnosis not present

## 2017-01-22 DIAGNOSIS — R279 Unspecified lack of coordination: Secondary | ICD-10-CM | POA: Diagnosis not present

## 2017-01-22 DIAGNOSIS — Z96642 Presence of left artificial hip joint: Secondary | ICD-10-CM | POA: Diagnosis not present

## 2017-01-22 DIAGNOSIS — Z9181 History of falling: Secondary | ICD-10-CM | POA: Diagnosis not present

## 2017-01-23 ENCOUNTER — Non-Acute Institutional Stay (SKILLED_NURSING_FACILITY): Payer: Medicare Other | Admitting: Internal Medicine

## 2017-01-23 ENCOUNTER — Encounter: Payer: Self-pay | Admitting: Internal Medicine

## 2017-01-23 DIAGNOSIS — R4189 Other symptoms and signs involving cognitive functions and awareness: Secondary | ICD-10-CM

## 2017-01-23 DIAGNOSIS — R55 Syncope and collapse: Secondary | ICD-10-CM | POA: Diagnosis not present

## 2017-01-23 DIAGNOSIS — R279 Unspecified lack of coordination: Secondary | ICD-10-CM | POA: Diagnosis not present

## 2017-01-23 DIAGNOSIS — Z9181 History of falling: Secondary | ICD-10-CM | POA: Diagnosis not present

## 2017-01-23 DIAGNOSIS — M6281 Muscle weakness (generalized): Secondary | ICD-10-CM | POA: Diagnosis not present

## 2017-01-23 DIAGNOSIS — Z96642 Presence of left artificial hip joint: Secondary | ICD-10-CM | POA: Diagnosis not present

## 2017-01-23 NOTE — Progress Notes (Signed)
Location:   [facility] Room Number: 106/D Place of Service:  SNF [phone]) Provider:  Freddi Starr, MD  Patient Care Team: Virgie Dad, MD as PCP - General (Internal Medicine) Virgie Dad, MD as Consulting Physician (Geriatric Medicine) Rolm Baptise as Physician Assistant (Internal Medicine)  Extended Emergency Contact Information Primary Emergency Contact: Ketzia, [place], [place] [address] [address]itter of [place] Phone: [phone] Mobile Phone: [phone] Relation: Daughter Secondary Emergency Contact: Josie, [place],  [address] [place] of [place] Phone: [phone] Relation: Son  Code Status:  DNR Goals of care: Advanced Directive information Advanced Directives 01/23/2017  Does Patient Have a Medical Advance Directive? Yes  Type of Advance Directive Out of facility DNR (pink MOST or yellow form)  Does patient want to make changes to medical advance directive? No - Patient declined  Copy of [place] in Chart? -  Pre-existing out of facility DNR order (yellow form or pink MOST form) -     Chief Complaint  Patient presents with  . Acute Visit    Decreased Responsiveness    HPI:  Pt is a 83 y.o. female seen today for an acute visit for Decreased responsiveness.  Patient has been long term resident of facility. She has h/o Hypertension Chronic renal disease , h/o femur fracture and Dementia with behavior disturbances  She has a history at times of intermittent syncopal episodes of unknown etiology.  This usually presents with unresponsiveness and then she fairly quickly returned back to her baseline opens her eyes starts talking andin a matter matter minutes his back to her normal self.  Apparently earlier today she actually would open her eyes with sternal rub but would not do much else.  However when I was told about this and entered the room she was actually  more responsive talking some making eye contact --her daughter actually was feeding her.  Family has not been aggressive in wanting extensive workup of this.  Her vital signs are stable and she now appears to be essentially at her baseline with significant dementia but alert  Vital signs are unremarkable as well as O2 sats ration  I do note she is on Xanax twice a day did receive Xanax this morning but she has tolerated this very well in the past-and has been effective in helping control her behaviors and agitation-she also receives Risperdal twice a day   home   Past Medical History:  Diagnosis Date  . Alzheimer disease   . Anemia   . Anxiety   . Arthritis   . Hyperlipemia   . Hypertension   . Kidney calculus   . Pneumonia    Past Surgical History:  Procedure Laterality Date  . CHOLECYSTECTOMY    . HIP ARTHROPLASTY Left 11/18/2013   Procedure: ARTHROPLASTY BIPOLAR HIP;  Surgeon: Sanjuana Kava, MD;  Location: [facility];  Service: Orthopedics;  Laterality: Left;    Allergies  Allergen Reactions  . Sulfa Antibiotics Rash    Outpatient Encounter Prescriptions as of 01/23/2017  Medication Sig  . ALPRAZolam (XANAX) 0.25 MG tablet Take one tablet by mouth twice daily for anxiety. Hold for sedation  . aspirin EC 81 MG tablet Take 81 mg by mouth daily.  . calcium-vitamin D (OSCAL WITH D) 500-200 MG-UNIT tablet Take 1 tablet by mouth 2 (two) times daily.  Marland Kitchen Dextromethorphan-Guaifenesin (  ROBITUSSIN COUGH+CHEST CONG DM) 5-100 MG/5ML LIQD Take 10 mLs by mouth every 6 (six) hours as needed.   . metoprolol tartrate (LOPRESSOR) 25 MG tablet Take 12.5 mg by mouth 2 (two) times daily.   . risperiDONE (RISPERDAL) 0.25 MG tablet Take 0.25 mg by mouth daily.  . risperiDONE (RISPERDAL) 1 MG/ML oral solution Take 0.5 mg by mouth at bedtime.   . [DISCONTINUED] ondansetron (ZOFRAN-ODT) 4 MG disintegrating tablet Take 4 mg by mouth every 4 (four) hours as needed for nausea or vomiting.   No  facility-administered encounter medications on file as of 01/23/2017.     Review of Systems  Cannot really obtain secondary to dementia please see history of present illness nursing staff has not noted any complaints of pain chest pain shortness of breath  Immunization History  Administered Date(s) Administered  . Influenza-Unspecified 06/08/2014, 06/03/2016  . Pneumococcal-Unspecified 06/10/2016   Pertinent  Health Maintenance Due  Topic Date Due  . DEXA SCAN  06/17/2017 (Originally 06[birth year]08/1991)  . INFLUENZA VACCINE  04/01/2017  . PNA vac Low Risk Adult (2 of 2 - PCV13) 06/10/2017   No flowsheet data found. Functional Status Survey:    Vitals:   01/23/17 1451  BP: 140/66  Pulse: 68  Resp: 16  Temp: 98.2 F (36.8 C)  TempSrc: Oral  SpO2: 98%    Physical Exam   In general this is a frail elderly female in no distress sitting in her wheelchair she is actually eating lunch with her daughter per daughter is eating fairly well.  She does make eye contact appears essentially at her baseline possibly a little slower to respond but not concerning so  Her skin is warm and dry.  Eyes pupils appear reactive light she has prescription lenses visual acuity appears intact she does make eye contact.  Oropharynx clear mucous membranes moist.  Chest she has poor respiratory effort does not really follow verbal commands but could not appreciate any liver breathing or congestion.  Heart is regular rate and rhythm without murmur gallop or rub she has baseline lower extremity edema.  Abdomen soft nontender positive bowel sounds.  Muscle skeletal moves her extremities at baseline her strength appears to be intact bilaterally has lower extremity weakness but this is not new or changed from baseline.  Neurologic as noted above no lateralizing findings her speech is confused which is baseline cranial nerves appear grossly intact.  Psych she does have severe dementia but is not really  agitated today with exam  Labs reviewed:  Recent Labs  10/07/16 0930 12/08/16 0700 12/15/16 0700  NA 138 141 141  K 3.9 4.1 3.9  CL 108 109 107  CO2 24 23 27   GLUCOSE 201* 95 91  BUN 41* 51* 35*  CREATININE 2.06* 2.20* 1.89*  CALCIUM 8.3* 8.4* 8.8*    Recent Labs  09/11/16 0500 10/07/16 0930 12/08/16 0700  AST 12* 14* 13*  ALT 11* 11* 12*  ALKPHOS 74 72 68  BILITOT 0.3 0.5 0.4  PROT 6.3* 5.8* 6.2*  ALBUMIN 3.1* 2.9* 2.9*    Recent Labs  09/11/16 0500 10/07/16 0930 12/08/16 0700  WBC 6.3 6.2 9.6  NEUTROABS 3.7 3.6 6.6  HGB 10.9* 11.0* 11.2*  HCT 33.5* 34.2* 34.9*  MCV 88.6 89.8 88.8  PLT 198 187 189   Lab Results  Component Value Date   TSH 1.708 12/08/2016   Lab Results  Component Value Date   HGBA1C 5.2 07/14/2016   Lab Results  Component Value Date  CHOL 166 12/27/2015   HDL 44 12/27/2015   LDLCALC 114 (H) 12/27/2015   TRIG 39 12/27/2015   CHOLHDL 3.8 12/27/2015     Assessment and plan.  Unresponsive episode  - .now back at her baseline this could again be a recurrent syncopal episode-will hold her p.m. Xanax today Would be hesitant to discontinue this and she has tolerated this long-term and would worry about withdrawal issues.  Continue to monitor vital signs pulse ox every 4 hours 2 and then every shift 24hours.  Again family does not really want extensive workup additional labs or checking her urine at this time  [phone]

## 2017-01-30 DIAGNOSIS — F411 Generalized anxiety disorder: Secondary | ICD-10-CM | POA: Diagnosis not present

## 2017-01-30 DIAGNOSIS — F0391 Unspecified dementia with behavioral disturbance: Secondary | ICD-10-CM | POA: Diagnosis not present

## 2017-01-30 DIAGNOSIS — F29 Unspecified psychosis not due to a substance or known physiological condition: Secondary | ICD-10-CM | POA: Diagnosis not present

## 2017-02-03 ENCOUNTER — Encounter: Payer: Self-pay | Admitting: Internal Medicine

## 2017-02-03 ENCOUNTER — Non-Acute Institutional Stay (SKILLED_NURSING_FACILITY): Payer: Medicare Other | Admitting: Internal Medicine

## 2017-02-03 DIAGNOSIS — F0391 Unspecified dementia with behavioral disturbance: Secondary | ICD-10-CM | POA: Diagnosis not present

## 2017-02-03 DIAGNOSIS — N184 Chronic kidney disease, stage 4 (severe): Secondary | ICD-10-CM | POA: Diagnosis not present

## 2017-02-03 DIAGNOSIS — D631 Anemia in chronic kidney disease: Secondary | ICD-10-CM

## 2017-02-03 DIAGNOSIS — R55 Syncope and collapse: Secondary | ICD-10-CM | POA: Diagnosis not present

## 2017-02-03 DIAGNOSIS — I10 Essential (primary) hypertension: Secondary | ICD-10-CM | POA: Diagnosis not present

## 2017-02-03 NOTE — Progress Notes (Signed)
Location:   [facility] Room Number: 106/D Place of Service:  SNF (31) Provider:  Kyung Rudd, Rene Kocher, MD  Patient Care Team: Virgie Dad, MD as PCP - General (Internal Medicine) Virgie Dad, MD as Consulting Physician (Geriatric Medicine) Rolm Baptise as Physician Assistant (Internal Medicine)  Extended Emergency Contact Information Primary Emergency Contact: Meriah, [place], [place] [address] [address]nette Litter of [place] Phone: [phone] Mobile Phone: [phone] Relation: Daughter Secondary Emergency Contact: Lonie, [place], [place] [address] [place] of [place] Phone: [phone] Relation: Son  Code Status:  DNR Goals of care: Advanced Directive information Advanced Directives 02/03/2017  Does Patient Have a Medical Advance Directive? Yes  Type of Advance Directive Out of facility DNR (pink MOST or yellow form)  Does patient want to make changes to medical advance directive? No - Patient declined  Copy of [place] in Chart? -  Pre-existing out of facility DNR order (yellow form or pink MOST form) -     Chief Complaint  Patient presents with  . Medical Management of Chronic Issues    Routine Visit    HPI:  Pt is a 83 y.o. female seen today for medical management of chronic diseases.    Patient has been long term resident of facility. She has h/o Hypertension Chronic renal disease , h/o femur fracture and Dementia with behavior disturbances. Patient also has h/o episodes of syncope and Nausea. Her family has decided not to do any further work up. She was also treated for Asymptomatic UTI. Patient has not had new issues recently. Her weight is stable at 186 lbs. No new nursing issues. She is unable to give me any history.  Past Medical History:  Diagnosis Date  . Alzheimer disease   . Anemia   . Anxiety   . Arthritis   . Hyperlipemia   . Hypertension   . Kidney calculus     . Pneumonia    Past Surgical History:  Procedure Laterality Date  . CHOLECYSTECTOMY    . HIP ARTHROPLASTY Left 11/18/2013   Procedure: ARTHROPLASTY BIPOLAR HIP;  Surgeon: Sanjuana Kava, MD;  Location: [facility];  Service: Orthopedics;  Laterality: Left;    Allergies  Allergen Reactions  . Sulfa Antibiotics Rash    Review of Systems  Unable to perform ROS: Dementia   Outpatient Encounter Prescriptions as of 02/03/2017  Medication Sig  . ALPRAZolam (XANAX) 0.25 MG tablet Take one tablet by mouth twice daily for anxiety. Hold for sedation  . aspirin EC 81 MG tablet Take 81 mg by mouth daily.  . calcium-vitamin D (OSCAL WITH D) 500-200 MG-UNIT tablet Take 1 tablet by mouth 2 (two) times daily.  Marland Kitchen Dextromethorphan-Guaifenesin (ROBITUSSIN COUGH+CHEST CONG DM) 5-100 MG/5ML LIQD Take 10 mLs by mouth every 6 (six) hours as needed.   . metoprolol tartrate (LOPRESSOR) 25 MG tablet Take 12.5 mg by mouth 2 (two) times daily.   . risperiDONE (RISPERDAL) 0.25 MG tablet Take 0.25 mg by mouth daily.  . risperiDONE (RISPERDAL) 1 MG/ML oral solution Take 0.5 mg by mouth at bedtime.    No facility-administered encounter medications on file as of 02/03/2017.     Immunization History  Administered Date(s) Administered  . Influenza-Unspecified 06/08/2014, 06/03/2016  . Pneumococcal-Unspecified 06/10/2016   Pertinent  Health Maintenance Due  Topic Date Due  . DEXA SCAN  06/17/2017 (Originally 06[birth year]/1991)  .  INFLUENZA VACCINE  04/01/2017  . PNA vac Low Risk Adult (2 of 2 - PCV13) 06/10/2017   No flowsheet data found. Functional Status Survey:    Vitals:   02/03/17 1501  BP: (!) 111/57  Pulse: (!) 48  Resp: 19  Temp: 97.9 F (36.6 C)  TempSrc: Oral  SpO2: 97%  Weight: 186 lb (84.4 kg)  Height: 5\' 1"  (1.549 m)   Body mass index is 35.14 kg/m. Physical Exam Constitutional: She appears well-developed and well-nourished.  HENT:  Head: Normocephalic.  Mouth/Throat: Oropharynx is clear and  moist.  Eyes: Pupils are equal, round, and reactive to light.  Neck: Neck supple.  Cardiovascular: Normal rate, regular rhythm and normal heart sounds.   Pulmonary/Chest: Effort normal and breath sounds normal. No respiratory distress. She has no wheezes.  Abdominal: Soft. Bowel sounds are normal. She exhibits no distension. There is no tenderness. There is no rebound.  Musculoskeletal: She exhibits no edema.  Neurological: She is alert.  Not oriented. Does not follow any commands.  Skin: Skin is warm and dry.  Psychiatric: She has a normal mood and affect. Her behavior is normal.  Labs reviewed:  Recent Labs  10/07/16 0930 12/08/16 0700 12/15/16 0700  NA 138 141 141  K 3.9 4.1 3.9  CL 108 109 107  CO2 24 23 27   GLUCOSE 201* 95 91  BUN 41* 51* 35*  CREATININE 2.06* 2.20* 1.89*  CALCIUM 8.3* 8.4* 8.8*    Recent Labs  09/11/16 0500 10/07/16 0930 12/08/16 0700  AST 12* 14* 13*  ALT 11* 11* 12*  ALKPHOS 74 72 68  BILITOT 0.3 0.5 0.4  PROT 6.3* 5.8* 6.2*  ALBUMIN 3.1* 2.9* 2.9*    Recent Labs  09/11/16 0500 10/07/16 0930 12/08/16 0700  WBC 6.3 6.2 9.6  NEUTROABS 3.7 3.6 6.6  HGB 10.9* 11.0* 11.2*  HCT 33.5* 34.2* 34.9*  MCV 88.6 89.8 88.8  PLT 198 187 189   Lab Results  Component Value Date   TSH 1.708 12/08/2016   Lab Results  Component Value Date   HGBA1C 5.2 07/14/2016   Lab Results  Component Value Date   CHOL 166 12/27/2015   HDL 44 12/27/2015   LDLCALC 114 (H) 12/27/2015   TRIG 39 12/27/2015   CHOLHDL 3.8 12/27/2015    Significant Diagnostic Results in last 30 days:  No results found.  Assessment/Plan  Essential hypertension BP  well controlled. No change in her Meds.  Syncopal Episodes Her HR is 48. Today and she is on Metoprolol. But usually her HR stays around 60. Will Hold metoprolol if HR less then 60 and also decrease the dose to 6.25 mg BID. Anemia in stage 4 chronic kidney disease  Hgb Stable  CKD (chronic kidney disease),  stage IV Creat stable. Health maintenance Since patient has dementia with Behavior problems and her frailty she is not candidate for anything aggressive.  Dementia with behavioral disturbance,  On Xanax and Risperdal PRN. Patient stable   Family/ staff Communication:   Labs/tests ordered:    CMP and CBC

## 2017-02-05 ENCOUNTER — Encounter (HOSPITAL_COMMUNITY)
Admission: RE | Admit: 2017-02-05 | Discharge: 2017-02-05 | Disposition: A | Discharge status: Home / Self Care | Payer: Medicare Other | Source: Skilled Nursing Facility | Attending: Internal Medicine | Admitting: Internal Medicine

## 2017-02-05 DIAGNOSIS — Z9181 History of falling: Secondary | ICD-10-CM | POA: Diagnosis present

## 2017-02-05 DIAGNOSIS — F29 Unspecified psychosis not due to a substance or known physiological condition: Secondary | ICD-10-CM | POA: Insufficient documentation

## 2017-02-05 DIAGNOSIS — I129 Hypertensive chronic kidney disease with stage 1 through stage 4 chronic kidney disease, or unspecified chronic kidney disease: Secondary | ICD-10-CM | POA: Insufficient documentation

## 2017-02-05 LAB — COMPREHENSIVE METABOLIC PANEL
ALK PHOS: 72 U/L (ref 38–126)
ALT: 10 U/L — ABNORMAL LOW (ref 14–54)
AST: 11 U/L — ABNORMAL LOW (ref 15–41)
Albumin: 3.1 g/dL — ABNORMAL LOW (ref 3.5–5.0)
Anion gap: 9 (ref 5–15)
BILIRUBIN TOTAL: 0.5 mg/dL (ref 0.3–1.2)
BUN: 41 mg/dL — ABNORMAL HIGH (ref 6–20)
CALCIUM: 8.8 mg/dL — AB (ref 8.9–10.3)
CHLORIDE: 109 mmol/L (ref 101–111)
CO2: 23 mmol/L (ref 22–32)
CREATININE: 1.92 mg/dL — AB (ref 0.44–1.00)
GFR, EST AFRICAN AMERICAN: 25 mL/min — AB (ref >60)
GFR, EST NON AFRICAN AMERICAN: 22 mL/min — AB (ref >60)
Glucose, Bld: 86 mg/dL (ref 65–99)
Potassium: 4.1 mmol/L (ref 3.5–5.1)
Sodium: 141 mmol/L (ref 135–145)
TOTAL PROTEIN: 6.5 g/dL (ref 6.5–8.1)

## 2017-02-05 LAB — CBC WITH DIFFERENTIAL/PLATELET
Basophils Absolute: 0 10*3/uL (ref 0.0–0.1)
Basophils Relative: 1 %
EOS PCT: 4 %
Eosinophils Absolute: 0.3 10*3/uL (ref 0.0–0.7)
HEMATOCRIT: 34.1 % — AB (ref 36.0–46.0)
Hemoglobin: 10.8 g/dL — ABNORMAL LOW (ref 12.0–15.0)
LYMPHS ABS: 2.5 10*3/uL (ref 0.7–4.0)
LYMPHS PCT: 31 %
MCH: 27.6 pg (ref 26.0–34.0)
MCHC: 31.7 g/dL (ref 30.0–36.0)
MCV: 87.2 fL (ref 78.0–100.0)
MONO ABS: 0.5 10*3/uL (ref 0.1–1.0)
Monocytes Relative: 6 %
NEUTROS ABS: 4.8 10*3/uL (ref 1.7–7.7)
Neutrophils Relative %: 58 %
PLATELETS: 229 10*3/uL (ref 150–400)
RBC: 3.91 MIL/uL (ref 3.87–5.11)
RDW: 14.7 % (ref 11.5–15.5)
WBC: 8.1 10*3/uL (ref 4.0–10.5)

## 2017-02-26 ENCOUNTER — Encounter: Payer: Self-pay | Admitting: Internal Medicine

## 2017-02-26 ENCOUNTER — Non-Acute Institutional Stay (SKILLED_NURSING_FACILITY): Payer: Medicare Other | Admitting: Internal Medicine

## 2017-02-26 DIAGNOSIS — F0391 Unspecified dementia with behavioral disturbance: Secondary | ICD-10-CM | POA: Diagnosis not present

## 2017-02-26 DIAGNOSIS — I10 Essential (primary) hypertension: Secondary | ICD-10-CM | POA: Diagnosis not present

## 2017-02-26 DIAGNOSIS — D631 Anemia in chronic kidney disease: Secondary | ICD-10-CM

## 2017-02-26 DIAGNOSIS — N184 Chronic kidney disease, stage 4 (severe): Secondary | ICD-10-CM

## 2017-02-26 NOTE — Progress Notes (Signed)
Location:   [facility] Room Number: 106/D Place of Service:  SNF (31) Provider: Gardiner Fanti, Rene Kocher, MD  Patient Care Team: Virgie Dad, MD as PCP - General (Internal Medicine) Virgie Dad, MD as Consulting Physician (Geriatric Medicine) Rolm Baptise as Physician Assistant (Internal Medicine)  Extended Emergency Contact Information Primary Emergency Contact: Kalley, Nicholl, [place] [address] [address]itter of [place] Phone: [phone] Mobile Phone: [phone] Relation: Daughter Secondary Emergency Contact: Laressa, Bolinger, [place] [address] [place] of [place] Phone: [phone] Relation: Son  Code Status:  DNR Goals of care: Advanced Directive information Advanced Directives 02/26/2017  Does Patient Have a Medical Advance Directive? Yes  Type of Advance Directive Out of facility DNR (pink MOST or yellow form)  Does patient want to make changes to medical advance directive? No - Patient declined  Copy of [place] in Chart? -  Pre-existing out of facility DNR order (yellow form or pink MOST form) -     Chief Complaint  Patient presents with  . Medical Management of Chronic Issues    Routine Visit  For medical management of chronic medical conditions including hyper tension dementia chronic renal insufficiency history of femur fracture.  Also history of syncope and nausea.  History of present was.  Patient is a 83 year old female seen for management of chronic medical conditions as noted above  She continues to be at her baseline at times has had syncopal episodes with nausea but quickly recovers from these and family does not want aggressive workup.  Her weight appears to be stable-nursing does not report any new issues.  Recent creatinine of 1.92 BUN of 31 appears to be relatively baseline she does have a history of chronic renal insufficiency.  Regards to dementia she does  have a times behavioral issues she is on Risperdal twice a day 0.25 mg the morning 0.5 mg at at bedtime.--This appears to have helped       Patient has severe dementia and cannot give any review of systems        Past Medical History:  Diagnosis Date  . Alzheimer disease   . Anemia   . Anxiety   . Arthritis   . Hyperlipemia   . Hypertension   . Kidney calculus   . Pneumonia    Past Surgical History:  Procedure Laterality Date  . CHOLECYSTECTOMY    . HIP ARTHROPLASTY Left 11/18/2013   Procedure: ARTHROPLASTY BIPOLAR HIP;  Surgeon: Sanjuana Kava, MD;  Location: [facility];  Service: Orthopedics;  Laterality: Left;    Allergies  Allergen Reactions  . Sulfa Antibiotics Rash    Outpatient Encounter Prescriptions as of 02/26/2017  Medication Sig  . ALPRAZolam (XANAX) 0.25 MG tablet Take one tablet by mouth twice daily for anxiety. Hold for sedation  . aspirin EC 81 MG tablet Take 81 mg by mouth daily.  . calcium-vitamin D (OSCAL WITH D) 500-200 MG-UNIT tablet Take 1 tablet by mouth 2 (two) times daily.  Marland Kitchen Dextromethorphan-Guaifenesin (ROBITUSSIN COUGH+CHEST CONG DM) 5-100 MG/5ML LIQD Take 10 mLs by mouth every 6 (six) hours as needed.   . metoprolol tartrate (LOPRESSOR) 25 MG tablet Take 12.5 mg by mouth 2 (two) times daily.   Marland Kitchen nystatin cream (MYCOSTATIN) Apply 1 application topically daily. Under both breast  . risperiDONE (RISPERDAL) 0.25 MG tablet Take 0.25 mg by mouth daily.  Marland Kitchen  risperiDONE (RISPERDAL) 1 MG/ML oral solution Take 0.5 mg by mouth at bedtime.    No facility-administered encounter medications on file as of 02/26/2017.     Review of Systems   Unattainable secondary to dementia please see history of present illness  Immunization History  Administered Date(s) Administered  . Influenza-Unspecified 06/08/2014, 06/03/2016  . Pneumococcal-Unspecified 06/10/2016   Pertinent  Health Maintenance Due  Topic Date Due  . DEXA SCAN  06/17/2017 (Originally 06[birth year]08/1991)   . INFLUENZA VACCINE  04/01/2017  . PNA vac Low Risk Adult (2 of 2 - PCV13) 06/10/2017   No flowsheet data found. Functional Status Survey:    Vitals:   02/26/17 1413  BP: (!) 147/70  Pulse: 78  Respirations are 17-temperature is pending-most recent weight 186 appears to be relatively stable  Physical Exam Constitutional: She appears well-developedand well-nourished. Lying comfortably in bed.  Her skin is warm and dry   HENT:  Head: Normocephalic.  Mouth/Throat: Oropharynx is clear and moist.  Eyes: Pupils are equal, round, and reactive to light. She has prescription lenses Neck: Neck supple.  Cardiovascular: Normal rate, regular rhythmand normal heart sounds.  Pulmonary/Chest: Effort normaland breath sounds normal. No respiratory distress. She has no wheezes. She has no rales.  Abdominal: Soft. Bowel sounds are normal. She exhibits no distension. There is no tenderness. There is no rebound.  Musculoskeletal:  B/L edemawhich appears baseline  mild -- is able to move all extremities 4 strength appears to be intact all 4 extremities  Neurological: She is alert.  Not oriented. Does not follow any commandssome mild agitation with exam today Labs reviewed:  Recent Labs  12/08/16 0700 12/15/16 0700 02/05/17 0738  NA 141 141 141  K 4.1 3.9 4.1  CL 109 107 109  CO2 23 27 23   GLUCOSE 95 91 86  BUN 51* 35* 41*  CREATININE 2.20* 1.89* 1.92*  CALCIUM 8.4* 8.8* 8.8*    Recent Labs  10/07/16 0930 12/08/16 0700 02/05/17 0738  AST 14* 13* 11*  ALT 11* 12* 10*  ALKPHOS 72 68 72  BILITOT 0.5 0.4 0.5  PROT 5.8* 6.2* 6.5  ALBUMIN 2.9* 2.9* 3.1*    Recent Labs  10/07/16 0930 12/08/16 0700 02/05/17 0738  WBC 6.2 9.6 8.1  NEUTROABS 3.6 6.6 4.8  HGB 11.0* 11.2* 10.8*  HCT 34.2* 34.9* 34.1*  MCV 89.8 88.8 87.2  PLT 187 189 229   Lab Results  Component Value Date   TSH 1.708 12/08/2016   Lab Results  Component Value Date   HGBA1C 5.2 07/14/2016   Lab  Results  Component Value Date   CHOL 166 12/27/2015   HDL 44 12/27/2015   LDLCALC 114 (H) 12/27/2015   TRIG 39 12/27/2015   CHOLHDL 3.8 12/27/2015    Significant Diagnostic Results in last 30 days:  No results found.  Assessment/Plan T1 history of dementia with behaviors this appears stabilized on Risperdal twice a day as well as Xanax twice a day-she is resting comfortably still has some agitation at times especially with invasive maneuvers.  #2 history of chronic kidney disease this appears relatively stable with a creatinine of 1.92 BUN of 42 on lab done on 02/05/2017 will monitor periodically-blood draws are difficult time because of agitation.  #3 anemia of chronic disease this is thought most likely secondary to her chronic kidney disease hemoglobin has been stable At 10.8 on lab done 02/08/2017.  #4 history of syncopal episodes --last one was several weeks ago-- she quickly returns to baseline  family does not want aggressive workup of this.   #5 hypertension she continues on Lopressor 6.25 mg twice a day-she does  have somewhat variable blood pressure symptoms with  mild elevations this could be anxiety related--- sometimes getting readings is difficult because of agitation at this point will monitor I do not see consistently significantly elevated high or low readings  Metoprolol was reduced secondary to occasional bradycardic readings I got in the 70s on exam today  [id number]

## 2017-03-19 ENCOUNTER — Encounter: Payer: Self-pay | Admitting: Internal Medicine

## 2017-03-19 ENCOUNTER — Non-Acute Institutional Stay (SKILLED_NURSING_FACILITY): Payer: Medicare Other | Admitting: Internal Medicine

## 2017-03-19 DIAGNOSIS — N184 Chronic kidney disease, stage 4 (severe): Secondary | ICD-10-CM | POA: Diagnosis not present

## 2017-03-19 DIAGNOSIS — I10 Essential (primary) hypertension: Secondary | ICD-10-CM | POA: Diagnosis not present

## 2017-03-19 DIAGNOSIS — F0391 Unspecified dementia with behavioral disturbance: Secondary | ICD-10-CM | POA: Diagnosis not present

## 2017-03-19 DIAGNOSIS — R55 Syncope and collapse: Secondary | ICD-10-CM

## 2017-03-19 DIAGNOSIS — D631 Anemia in chronic kidney disease: Secondary | ICD-10-CM | POA: Diagnosis not present

## 2017-03-19 NOTE — Progress Notes (Signed)
Location:    [facility] Room Number: 106/D Place of Service:  SNF (31) Provider:  Virgie Dad, MD  Virgie Dad, MD  Patient Care Team: Virgie Dad, MD as PCP - General (Internal Medicine) Virgie Dad, MD as Consulting Physician (Geriatric Medicine) Rolm Baptise as Physician Assistant (Internal Medicine)  Extended Emergency Contact Information Primary Emergency Contact: Adrina, Armijo, [place] [address] [address] Litter of [place] Phone: [phone] Mobile Phone: [phone] Relation: Daughter Secondary Emergency Contact: [place], [place], [place] [address] [place] of [place] Phone: [phone] Relation: Son  Code Status:  DNR Goals of care: Advanced Directive information Advanced Directives 03/19/2017  Does Patient Have a Medical Advance Directive? Yes  Type of Advance Directive Out of facility DNR (pink MOST or yellow form)  Does patient want to make changes to medical advance directive? No - Patient declined  Copy of [place] in Chart? -  Pre-existing out of facility DNR order (yellow form or pink MOST form) -     Chief Complaint  Patient presents with  . Medical Management of Chronic Issues    Routine Visit    HPI:  Pt is a 83 y.o. female seen today for medical management of chronic diseases.    Patient has been long term resident of facility. She has h/o Hypertension, Chronic renal disease , h/o femur fracture and Dementia with behavior disturbances.  Since my last visit patient has been stable and has no Nursing issues. Her weight is slightly up to 188 lbs. Her Metoprolol was decreased last visit due to bradycardia and Her BP and HR are WNL.  Past Medical History:  Diagnosis Date  . Alzheimer disease   . Anemia   . Anxiety   . Arthritis   . Hyperlipemia   . Hypertension   . Kidney calculus   . Pneumonia    Past Surgical History:  Procedure Laterality Date  .  CHOLECYSTECTOMY    . HIP ARTHROPLASTY Left 11/18/2013   Procedure: ARTHROPLASTY BIPOLAR HIP;  Surgeon: Sanjuana Kava, MD;  Location: [facility];  Service: Orthopedics;  Laterality: Left;    Allergies  Allergen Reactions  . Sulfa Antibiotics Rash    Outpatient Encounter Prescriptions as of 03/19/2017  Medication Sig  . ALPRAZolam (XANAX) 0.25 MG tablet Take one tablet by mouth twice daily for anxiety. Hold for sedation  . aspirin EC 81 MG tablet Take 81 mg by mouth daily.  . calcium-vitamin D (OSCAL WITH D) 500-200 MG-UNIT tablet Take 1 tablet by mouth 2 (two) times daily.  Marland Kitchen Dextromethorphan-Guaifenesin (ROBITUSSIN COUGH+CHEST CONG DM) 5-100 MG/5ML LIQD Take 10 mLs by mouth every 6 (six) hours as needed.   . metoprolol tartrate (LOPRESSOR) 25 MG tablet Take 6.25 mg by mouth 2 (two) times daily.   Marland Kitchen nystatin cream (MYCOSTATIN) Apply 1 application topically daily. Under both breast  . risperiDONE (RISPERDAL) 0.25 MG tablet Take 0.25 mg by mouth daily.  . risperiDONE (RISPERDAL) 1 MG/ML oral solution Take 0.5 mg by mouth at bedtime.    No facility-administered encounter medications on file as of 03/19/2017.      Review of Systems  Unable to perform ROS: Dementia    Immunization History  Administered Date(s) Administered  . Influenza-Unspecified 06/08/2014, 06/03/2016  . Pneumococcal-Unspecified 06/10/2016   Pertinent  Health Maintenance Due  Topic Date Due  . DEXA SCAN  06/17/2017 (Originally 06/08/1991)  . INFLUENZA VACCINE  04/01/2017  . PNA vac Low Risk Adult (2 of 2 - PCV13) 06/10/2017   No flowsheet data found. Functional Status Survey:    Vitals:   03/19/17 1128  BP: 130/64  Pulse: 72  Resp: 20  Temp: 98.7 F (37.1 C)  TempSrc: Oral   There is no height or weight on file to calculate BMI. Physical Exam Constitutional: She appears well-developedand well-nourished.  HENT:  Head: Normocephalic.  Mouth/Throat: Oropharynx is clear and moist.  Eyes: Pupils are equal,  round, and reactive to light.  Neck: Neck supple.  Cardiovascular: Normal rate, regular rhythmand normal heart sounds.  Pulmonary/Chest: Effort normaland breath sounds normal. No respiratory distress. She has no wheezes.  Abdominal: Soft. Bowel sounds are normal. She exhibits no distension. There is no tenderness. There is no rebound.  Musculoskeletal: She exhibits mild edema.  Neurological: She is alert.  Not oriented. Does follow some commands.But then get distracted Skin: Skin is warmand dry.  Psychiatric: She has a normal mood and affect. Her behavior is normal Labs reviewed:  Recent Labs  12/08/16 0700 12/15/16 0700 02/05/17 0738  NA 141 141 141  K 4.1 3.9 4.1  CL 109 107 109  CO2 23 27 23   GLUCOSE 95 91 86  BUN 51* 35* 41*  CREATININE 2.20* 1.89* 1.92*  CALCIUM 8.4* 8.8* 8.8*    Recent Labs  10/07/16 0930 12/08/16 0700 02/05/17 0738  AST 14* 13* 11*  ALT 11* 12* 10*  ALKPHOS 72 68 72  BILITOT 0.5 0.4 0.5  PROT 5.8* 6.2* 6.5  ALBUMIN 2.9* 2.9* 3.1*    Recent Labs  10/07/16 0930 12/08/16 0700 02/05/17 0738  WBC 6.2 9.6 8.1  NEUTROABS 3.6 6.6 4.8  HGB 11.0* 11.2* 10.8*  HCT 34.2* 34.9* 34.1*  MCV 89.8 88.8 87.2  PLT 187 189 229   Lab Results  Component Value Date   TSH 1.708 12/08/2016   Lab Results  Component Value Date   HGBA1C 5.2 07/14/2016   Lab Results  Component Value Date   CHOL 166 12/27/2015   HDL 44 12/27/2015   LDLCALC 114 (H) 12/27/2015   TRIG 39 12/27/2015   CHOLHDL 3.8 12/27/2015    Significant Diagnostic Results in last 30 days:  No results found.  Assessment/Plan  Essential hypertension BP  well controlled. No change in her Meds.  H/O Syncope No work up and patient is stable. No episodes noted.  Anemia in stage 4 chronic kidney disease Hgb Stable. Will continue to monitor  CKD (chronic kidney disease), stage IV Creat stable. Health maintenance Since patient has dementia with Behavior problems and her  frailty she is not candidate for anything aggressive.  Dementia with behavioral disturbance,  On Xanax and Risperdal PRN. Patient stable    Family/ staff Communication:   Labs/tests ordered:   Repeat BMP and CBC in next visit   Total time spent in this patient care encounter was 25_ minutes; greater than 50% of the visit spent reviewing labs and Medicines and coordinating care for problems addressed at this encounter.

## 2017-04-16 ENCOUNTER — Non-Acute Institutional Stay (SKILLED_NURSING_FACILITY): Payer: Medicare Other | Admitting: Internal Medicine

## 2017-04-16 ENCOUNTER — Encounter: Payer: Self-pay | Admitting: Internal Medicine

## 2017-04-16 DIAGNOSIS — R55 Syncope and collapse: Secondary | ICD-10-CM | POA: Diagnosis not present

## 2017-04-16 DIAGNOSIS — D631 Anemia in chronic kidney disease: Secondary | ICD-10-CM

## 2017-04-16 DIAGNOSIS — I10 Essential (primary) hypertension: Secondary | ICD-10-CM

## 2017-04-16 DIAGNOSIS — N184 Chronic kidney disease, stage 4 (severe): Secondary | ICD-10-CM

## 2017-04-16 DIAGNOSIS — F0391 Unspecified dementia with behavioral disturbance: Secondary | ICD-10-CM | POA: Diagnosis not present

## 2017-04-16 NOTE — Progress Notes (Signed)
Location:   [facility] Room Number: 106/D Place of Service:  SNF (31) Provider:  Gardiner Fanti, Rene Kocher, MD  Patient Care Team: Virgie Dad, MD as PCP - General (Internal Medicine) Virgie Dad, MD as Consulting Physician (Geriatric Medicine) Rolm Baptise as Physician Assistant (Internal Medicine)  Extended Emergency Contact Information Primary Emergency Contact: Marsena, Taff, [place] [address] [address] Litter of [place] Phone: [phone] Mobile Phone: [phone] Relation: Daughter Secondary Emergency Contact: Joci, Dress, [place] [address] [place] of [place] Phone: [phone] Relation: Son  Code Status:  DNR Goals of care: Advanced Directive information Advanced Directives 04/16/2017  Does Patient Have a Medical Advance Directive? Yes  Type of Advance Directive Out of facility DNR (pink MOST or yellow form)  Does patient want to make changes to medical advance directive? No - Patient declined  Copy of [place] in Chart? -  Pre-existing out of facility DNR order (yellow form or pink MOST form) -     Chief Complaint  Patient presents with  . Medical Management of Chronic Issues    Routine Visit  Medical management of chronic medical issues including dementia with behaviors-hypertension-chronic renal disease-history femur fracture-anemia secondary to chronic kidney disease.  HPI:  Pt is a 83 y.o. female seen today for medical management of chronic disease as noted above.  She continues to be stable past significant dementia but this appears well controlled behavior wise with her breasts were not twice a day as well as Xanax twice a day-she has been followed by psych services in the past one point had significant agitation and behaviors but this has really improved year.  Her weight appears relatively stable with last month's weight 189 pounds.  She does have a hypertension  history is on Lopressor sometimes it's difficult to get a blood pressure secondary to agitation with invasive maneuvers but blood pressure today was 120/67 is suspected by machine as well as manually.  She does have a history of chronic renal disease last creatinine was 1.92 BUN was 41 back in June and this appears to be stable.  She also has a history of a femur fracture which has been relatively asymptomatic she largely ambulates in a wheelchair and does not really use a walker secondary to her severe dementia.  And lower extremity weakness.  She does have a history of anemia with chronic kidney disease this is stable as well with a hemoglobin of 10.5 back in June 2018.  She has a history of syncopal episodes  in the past of unclear etiology however these continued be of short duration she quickly retursd to baseline she actually has not had a syncopal episode here in several weeks.  Her daughter does not want aggressive workup of this or hospitalization .     Past Medical History:  Diagnosis Date  . Alzheimer disease   . Anemia   . Anxiety   . Arthritis   . Hyperlipemia   . Hypertension   . Kidney calculus   . Pneumonia    Past Surgical History:  Procedure Laterality Date  . CHOLECYSTECTOMY    . HIP ARTHROPLASTY Left 11/18/2013   Procedure: ARTHROPLASTY BIPOLAR HIP;  Surgeon: Sanjuana Kava, MD;  Location: [facility];  Service: Orthopedics;  Laterality: Left;    Allergies  Allergen Reactions  . Sulfa Antibiotics Rash    Outpatient  Encounter Prescriptions as of 04/16/2017  Medication Sig  . ALPRAZolam (XANAX) 0.25 MG tablet Take one tablet by mouth twice daily for anxiety. Hold for sedation  . aspirin EC 81 MG tablet Take 81 mg by mouth daily.  . calcium-vitamin D (OSCAL WITH D) 500-200 MG-UNIT tablet Take 1 tablet by mouth 2 (two) times daily.  Marland Kitchen Dextromethorphan-Guaifenesin (ROBITUSSIN COUGH+CHEST CONG DM) 5-100 MG/5ML LIQD Take 10 mLs by mouth every 6 (six) hours as needed.     . metoprolol tartrate (LOPRESSOR) 25 MG tablet Take 6.25 mg by mouth 2 (two) times daily.   Marland Kitchen nystatin cream (MYCOSTATIN) Apply 1 application topically daily. Under both breast  . risperiDONE (RISPERDAL) 0.25 MG tablet Take 0.25 mg by mouth daily.  . risperiDONE (RISPERDAL) 1 MG/ML oral solution Take 0.5 mg by mouth at bedtime.    No facility-administered encounter medications on file as of 04/16/2017.      Review of Systems Essentially unattainable secondary to dementia Immunization History  Administered Date(s) Administered  . Influenza-Unspecified 06/08/2014, 06/03/2016  . Pneumococcal-Unspecified 06/10/2016   Pertinent  Health Maintenance Due  Topic Date Due  . DEXA SCAN  06/17/2017 (Originally 06[birth year]/1991)  . INFLUENZA VACCINE  08/01/2017 (Originally 04/01/2017)  . PNA vac Low Risk Adult (2 of 2 - PCV13) 06/10/2017   No flowsheet data found. Functional Status Survey:   Temperature is 96.8 pulse 72 respirations 18 blood pressure 120/67-weight is 189.     Physical Exam Constitutional: She appears well-developedand well-nourished. Lying comfortably in bed.-she is calm but agitated with invasive maneuvers  Her skin is warm and dry  HENT:  Head: Normocephalic.  Mouth/Throat: Oropharynx is clear and moist.  Eyes: Pupils are equal, round, and reactive to light. Sh Neck: Neck supple.  Cardiovascular: Normal rate, regular rhythmand normal heart sounds. Heart sounds are distant Pulmonary/Chest: Effort normaland breath sounds normal. No respiratory distress. She has no wheezes. She has no rales. Continues to have poor respiratory effort does not follow verbal commands  Abdominal: Soft. Bowel sounds are normal. She exhibits no distension. There is no tenderness. There is no rebound.  Musculoskeletal:  B/L edemawhich appears baseline  mild -- is able to move all extremities 4 strength appears to be intact all 4 extremities  Neurological: She is alert.  Not oriented. Does  not follow any commandssome mild agitation with exam which is baseline Labs reviewed: Labs reviewed:  Recent Labs  12/08/16 0700 12/15/16 0700 02/05/17 0738  NA 141 141 141  K 4.1 3.9 4.1  CL 109 107 109  CO2 23 27 23   GLUCOSE 95 91 86  BUN 51* 35* 41*  CREATININE 2.20* 1.89* 1.92*  CALCIUM 8.4* 8.8* 8.8*    Recent Labs  10/07/16 0930 12/08/16 0700 02/05/17 0738  AST 14* 13* 11*  ALT 11* 12* 10*  ALKPHOS 72 68 72  BILITOT 0.5 0.4 0.5  PROT 5.8* 6.2* 6.5  ALBUMIN 2.9* 2.9* 3.1*    Recent Labs  10/07/16 0930 12/08/16 0700 02/05/17 0738  WBC 6.2 9.6 8.1  NEUTROABS 3.6 6.6 4.8  HGB 11.0* 11.2* 10.8*  HCT 34.2* 34.9* 34.1*  MCV 89.8 88.8 87.2  PLT 187 189 229   Lab Results  Component Value Date   TSH 1.708 12/08/2016   Lab Results  Component Value Date   HGBA1C 5.2 07/14/2016   Lab Results  Component Value Date   CHOL 166 12/27/2015   HDL 44 12/27/2015   LDLCALC 114 (H) 12/27/2015   TRIG 39 12/27/2015  CHOLHDL 3.8 12/27/2015    Significant Diagnostic Results in last 30 days:  No results found.  Assessment/Plan #1 history of dementia with behaviors this appears stable on Risperdal twice a day as well as Xanax twice a day aat this point will monitor her weight appears to be stable she appears to be doing well with supportive care.  #2 history of chronic renal disease as noted above this appears stable with a creatinine of 1.92 BUN of 41 will monitor at periodic intervals.  #3 hypertension this appears stable on Lopressor as noted above.  #4 history of anemia secondary to chronic kidney disease this is stable as well with recent hemoglobin 10.1 will monitor at periodic intervals.  #5 history of  left femur fracture again she appears to have made an unremarkable recovery-she largely ambulates in a wheelchair does not really want to any extent because of her lower extremity weakness and severe dementia.  She is on calcium. With vitamin  D.  #6-history of syncopal episodes -- these are intermittent and of short duration family does not desire aggressive workup  [phone]

## 2017-05-21 ENCOUNTER — Non-Acute Institutional Stay (SKILLED_NURSING_FACILITY): Payer: Medicare Other

## 2017-05-21 ENCOUNTER — Encounter: Payer: Self-pay | Admitting: Internal Medicine

## 2017-05-21 DIAGNOSIS — Z0000 Encounter for general adult medical examination without abnormal findings: Secondary | ICD-10-CM | POA: Diagnosis not present

## 2017-05-21 NOTE — Patient Instructions (Signed)
[name] , Thank you for taking time to come for your Medicare Wellness Visit. I appreciate your ongoing commitment to your health goals. Please review the following plan we discussed and let me know if I can assist you in the future.   Screening recommendations/referrals: Colonoscopy excluded, pt over age 81 Mammogram excluded, pt over age 73 Bone Density due Recommended yearly ophthalmology/optometry visit for glaucoma screening and checkup Recommended yearly dental visit for hygiene and checkup  Vaccinations: Influenza vaccine due Pneumococcal vaccine up to date Tdap vaccine due Shingles vaccine not in records    Advanced directives: DNR in chart, copies of health care power of attorney and living will are needed   Conditions/risks identified: None  Next appointment: Dr. Lyndel Safe makes rounds   Preventive Care 65 Years and Older, Female Preventive care refers to lifestyle choices and visits with your health care provider that can promote health and wellness. What does preventive care include?  A yearly physical exam. This is also called an annual well check.  Dental exams once or twice a year.  Routine eye exams. Ask your health care provider how often you should have your eyes checked.  Personal lifestyle choices, including:  Daily care of your teeth and gums.  Regular physical activity.  Eating a healthy diet.  Avoiding tobacco and drug use.  Limiting alcohol use.  Practicing safe sex.  Taking low-dose aspirin every day.  Taking vitamin and mineral supplements as recommended by your health care provider. What happens during an annual well check? The services and screenings done by your health care provider during your annual well check will depend on your age, overall health, lifestyle risk factors, and family history of disease. Counseling  Your health care provider may ask you questions about your:  Alcohol use.  Tobacco use.  Drug use.  Emotional  well-being.  Home and relationship well-being.  Sexual activity.  Eating habits.  History of falls.  Memory and ability to understand (cognition).  Work and work Statistician.  Reproductive health. Screening  You may have the following tests or measurements:  Height, weight, and BMI.  Blood pressure.  Lipid and cholesterol levels. These may be checked every 5 years, or more frequently if you are over 69 years old.  Skin check.  Lung cancer screening. You may have this screening every year starting at age 48 if you have a 30-pack-year history of smoking and currently smoke or have quit within the past 15 years.  Fecal occult blood test (FOBT) of the stool. You may have this test every year starting at age 82.  Flexible sigmoidoscopy or colonoscopy. You may have a sigmoidoscopy every 5 years or a colonoscopy every 10 years starting at age 20.  Hepatitis C blood test.  Hepatitis B blood test.  Sexually transmitted disease (STD) testing.  Diabetes screening. This is done by checking your blood sugar (glucose) after you have not eaten for a while (fasting). You may have this done every 1-3 years.  Bone density scan. This is done to screen for osteoporosis. You may have this done starting at age 82.  Mammogram. This may be done every 1-2 years. Talk to your health care provider about how often you should have regular mammograms. Talk with your health care provider about your test results, treatment options, and if necessary, the need for more tests. Vaccines  Your health care provider may recommend certain vaccines, such as:  Influenza vaccine. This is recommended every year.  Tetanus, diphtheria, and acellular  pertussis (Tdap, Td) vaccine. You may need a Td booster every 10 years.  Zoster vaccine. You may need this after age 18.  Pneumococcal 13-valent conjugate (PCV13) vaccine. One dose is recommended after age 31.  Pneumococcal polysaccharide (PPSV23) vaccine. One  dose is recommended after age 58. Talk to your health care provider about which screenings and vaccines you need and how often you need them. This information is not intended to replace advice given to you by your health care provider. Make sure you discuss any questions you have with your health care provider. Document Released: 09/14/2015 Document Revised: 05/07/2016 Document Reviewed: 06/19/2015 Elsevier Interactive Patient Education  2017 [place] Prevention in the Home Falls can cause injuries. They can happen to people of all ages. There are many things you can do to make your home safe and to help prevent falls. What can I do on the outside of my home?  Regularly fix the edges of walkways and driveways and fix any cracks.  Remove anything that might make you trip as you walk through a door, such as a raised step or threshold.  Trim any bushes or trees on the path to your home.  Use bright outdoor lighting.  Clear any walking paths of anything that might make someone trip, such as rocks or tools.  Regularly check to see if handrails are loose or broken. Make sure that both sides of any steps have handrails.  Any raised decks and porches should have guardrails on the edges.  Have any leaves, snow, or ice cleared regularly.  Use sand or salt on walking paths during winter.  Clean up any spills in your garage right away. This includes oil or grease spills. What can I do in the bathroom?  Use night lights.  Install grab bars by the toilet and in the tub and shower. Do not use towel bars as grab bars.  Use non-skid mats or decals in the tub or shower.  If you need to sit down in the shower, use a plastic, non-slip stool.  Keep the floor dry. Clean up any water that spills on the floor as soon as it happens.  Remove soap buildup in the tub or shower regularly.  Attach bath mats securely with double-sided non-slip rug tape.  Do not have throw rugs and other  things on the floor that can make you trip. What can I do in the bedroom?  Use night lights.  Make sure that you have a light by your bed that is easy to reach.  Do not use any sheets or blankets that are too big for your bed. They should not hang down onto the floor.  Have a firm chair that has side arms. You can use this for support while you get dressed.  Do not have throw rugs and other things on the floor that can make you trip. What can I do in the kitchen?  Clean up any spills right away.  Avoid walking on wet floors.  Keep items that you use a lot in easy-to-reach places.  If you need to reach something above you, use a strong step stool that has a grab bar.  Keep electrical cords out of the way.  Do not use floor polish or wax that makes floors slippery. If you must use wax, use non-skid floor wax.  Do not have throw rugs and other things on the floor that can make you trip. What can I do with my stairs?  Do not leave any items on the stairs.  Make sure that there are handrails on both sides of the stairs and use them. Fix handrails that are broken or loose. Make sure that handrails are as long as the stairways.  Check any carpeting to make sure that it is firmly attached to the stairs. Fix any carpet that is loose or worn.  Avoid having throw rugs at the top or bottom of the stairs. If you do have throw rugs, attach them to the floor with carpet tape.  Make sure that you have a light switch at the top of the stairs and the bottom of the stairs. If you do not have them, ask someone to add them for you. What else can I do to help prevent falls?  Wear shoes that:  Do not have high heels.  Have rubber bottoms.  Are comfortable and fit you well.  Are closed at the toe. Do not wear sandals.  If you use a stepladder:  Make sure that it is fully opened. Do not climb a closed stepladder.  Make sure that both sides of the stepladder are locked into place.  Ask  someone to hold it for you, if possible.  Clearly mark and make sure that you can see:  Any grab bars or handrails.  First and last steps.  Where the edge of each step is.  Use tools that help you move around (mobility aids) if they are needed. These include:  Canes.  Walkers.  Scooters.  Crutches.  Turn on the lights when you go into a dark area. Replace any light bulbs as soon as they burn out.  Set up your furniture so you have a clear path. Avoid moving your furniture around.  If any of your floors are uneven, fix them.  If there are any pets around you, be aware of where they are.  Review your medicines with your doctor. Some medicines can make you feel dizzy. This can increase your chance of falling. Ask your doctor what other things that you can do to help prevent falls. This information is not intended to replace advice given to you by your health care provider. Make sure you discuss any questions you have with your health care provider. Document Released: 06/14/2009 Document Revised: 01/24/2016 Document Reviewed: 09/22/2014 Elsevier Interactive Patient Education  2017 [employer].

## 2017-05-21 NOTE — Progress Notes (Signed)
Subjective:   [name] is a 83 y.o. female who presents for an Initial Medicare Annual Wellness Visit at [place]; incapacitated patient unable to answer questions appropriately        Objective:    Today's Vitals   05/21/17 1431  BP: 132/70  Pulse: 62  Temp: 98 F (36.7 C)  TempSrc: Oral  SpO2: 98%  Weight: 186 lb (84.4 kg)  Height: 5\' 1"  (1.549 m)   Body mass index is 35.14 kg/m.   Current Medications (verified) Outpatient Encounter Prescriptions as of 05/21/2017  Medication Sig  . ALPRAZolam (XANAX) 0.25 MG tablet Take one tablet by mouth twice daily for anxiety. Hold for sedation  . aspirin EC 81 MG tablet Take 81 mg by mouth daily.  . calcium-vitamin D (OSCAL WITH D) 500-200 MG-UNIT tablet Take 1 tablet by mouth 2 (two) times daily.  Marland Kitchen Dextromethorphan-Guaifenesin (ROBITUSSIN COUGH+CHEST CONG DM) 5-100 MG/5ML LIQD Take 10 mLs by mouth every 6 (six) hours as needed.   . metoprolol tartrate (LOPRESSOR) 25 MG tablet Take 6.25 mg by mouth 2 (two) times daily.   . risperiDONE (RISPERDAL) 0.25 MG tablet Take 0.25 mg by mouth daily.  . risperiDONE (RISPERDAL) 1 MG/ML oral solution Take 0.5 mg by mouth at bedtime.   . [DISCONTINUED] nystatin cream (MYCOSTATIN) Apply 1 application topically daily. Under both breast   No facility-administered encounter medications on file as of 05/21/2017.     Allergies (verified) Sulfa antibiotics   History: Past Medical History:  Diagnosis Date  . Alzheimer disease   . Anemia   . Anxiety   . Arthritis   . Hyperlipemia   . Hypertension   . Kidney calculus   . Pneumonia    Past Surgical History:  Procedure Laterality Date  . CHOLECYSTECTOMY    . HIP ARTHROPLASTY Left 11/18/2013   Procedure: ARTHROPLASTY BIPOLAR HIP;  Surgeon: Sanjuana Kava, MD;  Location: [facility];  Service: Orthopedics;  Laterality: Left;   Family History  Problem Relation Age of Onset  . Alzheimer's disease Mother   . Heart  attack Father    Social History   Occupational History  . Not on file.   Social History Main Topics  . Smoking status: Never Smoker  . Smokeless tobacco: Never Used  . Alcohol use No  . Drug use: No  . Sexual activity: Not on file    Tobacco Counseling Counseling given: Not Answered   Activities of Daily Living In your present state of health, do you have any difficulty performing the following activities: 05/21/2017  Hearing? N  Vision? N  Difficulty concentrating or making decisions? Y  Walking or climbing stairs? Y  Dressing or bathing? Y  Doing errands, shopping? Y  Preparing Food and eating ? Y  Using the Toilet? Y  In the past six months, have you accidently leaked urine? Y  Do you have problems with loss of bowel control? Y  Managing your Medications? Y  Managing your Finances? Y  Housekeeping or managing your Housekeeping? Y  Some recent data might be hidden    Immunizations and Health Maintenance Immunization History  Administered Date(s) Administered  . Influenza-Unspecified 06/08/2014, 06/03/2016  . Pneumococcal-Unspecified 06/10/2016   There are no preventive care reminders to display for this patient.  Patient Care Team: Virgie Dad, MD as PCP - General (Internal Medicine) Virgie Dad, MD as Consulting Physician (Geriatric Medicine) Rolm Baptise as Physician Assistant (Internal Medicine)  Indicate  any recent Medical Services you may have received from other than [facility] providers in the past year (date may be approximate).     Assessment:   This is a routine wellness examination for [name].   Hearing/Vision screen No exam data present  Dietary issues and exercise activities discussed: Current Exercise Habits: The patient does not participate in regular exercise at present, Exercise limited by: neurologic condition(s)  Goals    None     Depression Screen PHQ 2/9 Scores 05/21/2017  Exception Documentation Medical reason    Fall  Risk Fall Risk  05/21/2017  Falls in the past year? Yes  Number falls in past yr: 1  Injury with Fall? Yes    Cognitive Function: MMSE - Mini Mental State Exam 05/21/2017  Not completed: Unable to complete        Screening Tests Health Maintenance  Topic Date Due  . DEXA SCAN  06/17/2017 (Originally [birth year]/1991)  . INFLUENZA VACCINE  08/01/2017 (Originally 04/01/2017)  . TETANUS/TDAP  09/20/2023 (Originally 06/07/1943)  . PNA vac Low Risk Adult  Completed      Plan:    I have personally reviewed and addressed the Medicare Annual Wellness questionnaire and have noted the following in the patient's chart:  A. Medical and social history B. Use of alcohol, tobacco or illicit drugs  C. Current medications and supplements D. Functional ability and status E.  Nutritional status F.  Physical activity G. Advance directives H. List of other physicians I.  Hospitalizations, surgeries, and ER visits in previous 12 months J.  [place] to include hearing, vision, cognitive, depression L. Referrals and appointments - none  In addition, I am unable to review and discuss with incapacitated patient certain preventive protocols, quality metrics, and best practice recommendations. A written personalized care plan for preventive services as well as general preventive health recommendations were provided to patient.  See attached scanned questionnaire for additional information.   Signed,   Rich Reining, RN Nurse Health Advisor   Quick Notes   Health Maintenance: TDAP, flu vaccine. and DEXA due.     Abnormal Screen: Unable to complete mental exam     Patient Concerns: None     Nurse Concerns: None

## 2017-05-26 ENCOUNTER — Encounter: Payer: Self-pay | Admitting: Internal Medicine

## 2017-05-26 ENCOUNTER — Non-Acute Institutional Stay (SKILLED_NURSING_FACILITY): Payer: Medicare Other | Admitting: Internal Medicine

## 2017-05-26 DIAGNOSIS — R001 Bradycardia, unspecified: Secondary | ICD-10-CM | POA: Diagnosis not present

## 2017-05-26 DIAGNOSIS — D631 Anemia in chronic kidney disease: Secondary | ICD-10-CM

## 2017-05-26 DIAGNOSIS — I10 Essential (primary) hypertension: Secondary | ICD-10-CM | POA: Diagnosis not present

## 2017-05-26 DIAGNOSIS — N184 Chronic kidney disease, stage 4 (severe): Secondary | ICD-10-CM | POA: Diagnosis not present

## 2017-05-26 NOTE — Progress Notes (Signed)
Location:   [place] Room Number: 106/D Place of Service:  SNF (31) Provider:  Kyung Rudd, Rene Kocher, MD  Patient Care Team: Virgie Dad, MD as PCP - General (Internal Medicine) Virgie Dad, MD as Consulting Physician (Geriatric Medicine) Rolm Baptise as Physician Assistant (Internal Medicine)  Extended Emergency Contact Information Primary Emergency Contact: Malli, [place], [place] [address] [address] Phone: [phone] Mobile Phone: [phone] Relation: Daughter Secondary Emergency Contact: Natayah, [place], [place] [address] [place] Phone: [phone] Relation: Son  Code Status:  DNR Goals of care: Advanced Directive information Advanced Directives 05/26/2017  Does Patient Have a Medical Advance Directive? Yes  Type of Advance Directive Out of facility DNR (pink MOST or yellow form)  Does patient want to make changes to medical advance directive? No - Patient declined  Copy of [place] in Chart? -  Pre-existing out of facility DNR order (yellow form or pink MOST form) -     Chief Complaint  Patient presents with  . Medical Management of Chronic Issues    Routine Visit    HPI:  Pt is a 83 y.o. female seen today for medical management of chronic diseases.    Patient has h/o Dementia with Behavior problems, Hypertension, CKD Stage 4, Anemia And h/O Femur fracture. Patient sitting in the Wheelchair. Cannot give me any history. Per nurses she has not had any issues. Her Weight is stable at 185-188 lbs.    Past Medical History:  Diagnosis Date  . Alzheimer disease   . Anemia   . Anxiety   . Arthritis   . Hyperlipemia   . Hypertension   . Kidney calculus   . Pneumonia    Past Surgical History:  Procedure Laterality Date  . CHOLECYSTECTOMY    . HIP ARTHROPLASTY Left 11/18/2013   Procedure: ARTHROPLASTY BIPOLAR HIP;  Surgeon: Sanjuana Kava,  MD;  Location: [facility];  Service: Orthopedics;  Laterality: Left;    Allergies  Allergen Reactions  . Sulfa Antibiotics Rash    Outpatient Encounter Prescriptions as of 05/26/2017  Medication Sig  . ALPRAZolam (XANAX) 0.25 MG tablet Take one tablet by mouth twice daily for anxiety. Hold for sedation  . aspirin EC 81 MG tablet Take 81 mg by mouth daily.  . calcium-vitamin D (OSCAL WITH D) 500-200 MG-UNIT tablet Take 1 tablet by mouth 2 (two) times daily.  Marland Kitchen Dextromethorphan-Guaifenesin (ROBITUSSIN COUGH+CHEST CONG DM) 5-100 MG/5ML LIQD Take 10 mLs by mouth every 6 (six) hours as needed.   . metoprolol tartrate (LOPRESSOR) 25 MG tablet Take 6.25 mg by mouth 2 (two) times daily.   . risperiDONE (RISPERDAL) 0.25 MG tablet Take 0.25 mg by mouth daily.  . risperiDONE (RISPERDAL) 1 MG/ML oral solution Take 0.5 mg by mouth at bedtime.    No facility-administered encounter medications on file as of 05/26/2017.      Review of Systems  Unable to perform ROS: Dementia    Immunization History  Administered Date(s) Administered  . Influenza-Unspecified 06/08/2014, 06/03/2016  . Pneumococcal-Unspecified 06/10/2016   Pertinent  Health Maintenance Due  Topic Date Due  . DEXA SCAN  06/17/2017 (Originally 06/08/1991)  . INFLUENZA VACCINE  08/01/2017 (Originally 04/01/2017)  . PNA vac Low Risk Adult  Completed   Fall Risk  05/21/2017  Falls in the past year? Yes  Number falls  in past yr: 1  Injury with Fall? Yes   Functional Status Survey:    Vitals:   05/26/17 0933  BP: (!) 111/57  Pulse: (!) 48 Manually Counted it was 60  Resp: 19  Temp: 97.9 F (36.6 C)  TempSrc: Oral  SpO2: 97%  Weight: 185 lb 6.4 oz (84.1 kg)  Height: 5\' 1"  (1.549 m)   Body mass index is 35.03 kg/m. Physical Exam  Constitutional: She appears well-developed and well-nourished.  HENT:  Head: Normocephalic.  Mouth/Throat: Oropharynx is clear and moist.  Eyes: Pupils are equal, round, and reactive to light.    Neck: Neck supple.  Cardiovascular: Normal rate and normal heart sounds.   Pulmonary/Chest: Effort normal and breath sounds normal. No respiratory distress. She has no wheezes. She has no rales.  Abdominal: Bowel sounds are normal. She exhibits no distension. There is no tenderness. There is no rebound.  Musculoskeletal:  Mild edema Bilateral  Neurological: She is alert.    Labs reviewed:  Recent Labs  12/08/16 0700 12/15/16 0700 02/05/17 0738  NA 141 141 141  K 4.1 3.9 4.1  CL 109 107 109  CO2 23 27 23   GLUCOSE 95 91 86  BUN 51* 35* 41*  CREATININE 2.20* 1.89* 1.92*  CALCIUM 8.4* 8.8* 8.8*    Recent Labs  10/07/16 0930 12/08/16 0700 02/05/17 0738  AST 14* 13* 11*  ALT 11* 12* 10*  ALKPHOS 72 68 72  BILITOT 0.5 0.4 0.5  PROT 5.8* 6.2* 6.5  ALBUMIN 2.9* 2.9* 3.1*    Recent Labs  10/07/16 0930 12/08/16 0700 02/05/17 0738  WBC 6.2 9.6 8.1  NEUTROABS 3.6 6.6 4.8  HGB 11.0* 11.2* 10.8*  HCT 34.2* 34.9* 34.1*  MCV 89.8 88.8 87.2  PLT 187 189 229   Lab Results  Component Value Date   TSH 1.708 12/08/2016   Lab Results  Component Value Date   HGBA1C 5.2 07/14/2016   Lab Results  Component Value Date   CHOL 166 12/27/2015   HDL 44 12/27/2015   LDLCALC 114 (H) 12/27/2015   TRIG 39 12/27/2015   CHOLHDL 3.8 12/27/2015    Significant Diagnostic Results in last 30 days:  No results found.  Assessment/Plan  Essential hypertension BP controlled. Due to her bradycardia will discontinue Metoprolol  Bradycardia Will discontinue Metoprolol. Patiet asymptomatic EKG in Morning   CKD (chronic kidney disease), stage IV  Creat Stable repeat Labs  Anemia in stage 4 chronic kidney disease  Repeat Hgb  Health maintenance Since patient has dementia with Behavior problems and her frailty she is not candidate for anything aggressive. DEXA scan Was ordered   Dementia with behavioral disturbance,  On Xanax and Risperdal PRN. Patient  stable     Family/ staff Communication:   Labs/tests ordered:  EKG, CBC, CMP

## 2017-05-27 ENCOUNTER — Encounter (HOSPITAL_COMMUNITY)
Admission: RE | Admit: 2017-05-27 | Discharge: 2017-05-27 | Disposition: A | Discharge status: Home / Self Care | Payer: Medicare Other | Source: Skilled Nursing Facility | Attending: Internal Medicine | Admitting: Internal Medicine

## 2017-05-27 DIAGNOSIS — I10 Essential (primary) hypertension: Secondary | ICD-10-CM | POA: Insufficient documentation

## 2017-05-27 DIAGNOSIS — R600 Localized edema: Secondary | ICD-10-CM | POA: Diagnosis not present

## 2017-05-27 DIAGNOSIS — B351 Tinea unguium: Secondary | ICD-10-CM | POA: Diagnosis not present

## 2017-05-27 DIAGNOSIS — I739 Peripheral vascular disease, unspecified: Secondary | ICD-10-CM | POA: Diagnosis not present

## 2017-05-27 DIAGNOSIS — I8393 Asymptomatic varicose veins of bilateral lower extremities: Secondary | ICD-10-CM | POA: Diagnosis not present

## 2017-05-27 LAB — CBC WITH DIFFERENTIAL/PLATELET
Basophils Absolute: 0 10*3/uL (ref 0.0–0.1)
Basophils Relative: 0 %
EOS ABS: 0.4 10*3/uL (ref 0.0–0.7)
EOS PCT: 5 %
HCT: 34.9 % — ABNORMAL LOW (ref 36.0–46.0)
Hemoglobin: 10.7 g/dL — ABNORMAL LOW (ref 12.0–15.0)
LYMPHS ABS: 1.9 10*3/uL (ref 0.7–4.0)
Lymphocytes Relative: 27 %
MCH: 27.2 pg (ref 26.0–34.0)
MCHC: 30.7 g/dL (ref 30.0–36.0)
MCV: 88.8 fL (ref 78.0–100.0)
MONOS PCT: 5 %
Monocytes Absolute: 0.3 10*3/uL (ref 0.1–1.0)
Neutro Abs: 4.3 10*3/uL (ref 1.7–7.7)
Neutrophils Relative %: 63 %
PLATELETS: 212 10*3/uL (ref 150–400)
RBC: 3.93 MIL/uL (ref 3.87–5.11)
RDW: 14.1 % (ref 11.5–15.5)
WBC: 6.9 10*3/uL (ref 4.0–10.5)

## 2017-05-27 LAB — COMPREHENSIVE METABOLIC PANEL
ALT: 13 U/L — ABNORMAL LOW (ref 14–54)
ANION GAP: 11 (ref 5–15)
AST: 14 U/L — ABNORMAL LOW (ref 15–41)
Albumin: 3.1 g/dL — ABNORMAL LOW (ref 3.5–5.0)
Alkaline Phosphatase: 74 U/L (ref 38–126)
BUN: 41 mg/dL — ABNORMAL HIGH (ref 6–20)
CHLORIDE: 103 mmol/L (ref 101–111)
CO2: 24 mmol/L (ref 22–32)
Calcium: 8.8 mg/dL — ABNORMAL LOW (ref 8.9–10.3)
Creatinine, Ser: 1.91 mg/dL — ABNORMAL HIGH (ref 0.44–1.00)
GFR calc non Af Amer: 22 mL/min — ABNORMAL LOW (ref >60)
GFR, EST AFRICAN AMERICAN: 26 mL/min — AB (ref >60)
Glucose, Bld: 142 mg/dL — ABNORMAL HIGH (ref 65–99)
POTASSIUM: 3.9 mmol/L (ref 3.5–5.1)
SODIUM: 138 mmol/L (ref 135–145)
Total Bilirubin: 0.4 mg/dL (ref 0.3–1.2)
Total Protein: 6.8 g/dL (ref 6.5–8.1)

## 2017-06-02 NOTE — Progress Notes (Signed)
This encounter was created in error - please disregard.

## 2017-06-11 ENCOUNTER — Encounter: Payer: Self-pay | Admitting: Internal Medicine

## 2017-06-11 ENCOUNTER — Non-Acute Institutional Stay (SKILLED_NURSING_FACILITY): Payer: Medicare Other | Admitting: Internal Medicine

## 2017-06-11 DIAGNOSIS — F0391 Unspecified dementia with behavioral disturbance: Secondary | ICD-10-CM

## 2017-06-11 NOTE — Progress Notes (Signed)
Location:   [facility] Room Number: 106/D Place of Service:  SNF [id number]) Provider:  Freddi Starr, MD  Patient Care Team: Virgie Dad, MD as PCP - General (Internal Medicine) Virgie Dad, MD as Consulting Physician (Geriatric Medicine) Rolm Baptise as Physician Assistant (Internal Medicine)  Extended Emergency Contact Information Primary Emergency Contact: Yulisa, [place], [place] [address] Johnnette Litter of [place] Phone: [phone] Mobile Phone: [phone] Relation: Daughter Secondary Emergency Contact: Angelicia, [place], [place] [address] [place] of [place] Phone: [phone] Relation: Son  Code Status:  DNR Goals of care: Advanced Directive information Advanced Directives 06/11/2017  Does Patient Have a Medical Advance Directive? Yes  Type of Advance Directive Out of facility DNR (pink MOST or yellow form)  Does patient want to make changes to medical advance directive? No - Patient declined  Copy of [place] in Chart? No - copy requested  Pre-existing out of facility DNR order (yellow form or pink MOST form) -     Chief Complaint  Patient presents with  . Acute Visit    Lethargy    HPI:  Pt is a 83 y.o. female seen today for an acute visit for Intermittent lethargy.  Apparently at times she will be somewhat lethargic although she does respond to tactile stimuli and easily wakes up.  This apparently is not new but family and nursing feel that possibly medications could be reduced to decrease the frequency of this.  She does have a history of significant dementia with behaviors has been followed by psychservices she is onRisperdal 0.25 mg in the morning and 0.5 mg p.m.-she is also on Xanax routinely 0.25 mg at 8 AM and 4 PM.  Vital signs are stable she is afebrile her family and nursing and this appears to be intermitted and suspect this is more medication  related     Past Medical History:  Diagnosis Date  . Alzheimer disease   . Anemia   . Anxiety   . Arthritis   . Hyperlipemia   . Hypertension   . Kidney calculus   . Pneumonia    Past Surgical History:  Procedure Laterality Date  . CHOLECYSTECTOMY    . HIP ARTHROPLASTY Left 11/18/2013   Procedure: ARTHROPLASTY BIPOLAR HIP;  Surgeon: Sanjuana Kava, MD;  Location: [facility];  Service: Orthopedics;  Laterality: Left;    Allergies  Allergen Reactions  . Sulfa Antibiotics Rash    Outpatient Encounter Prescriptions as of 06/11/2017  Medication Sig  . ALPRAZolam (XANAX) 0.25 MG tablet Take one tablet by mouth twice daily for anxiety. Hold for sedation  . aspirin EC 81 MG tablet Take 81 mg by mouth daily.  . calcium-vitamin D (OSCAL WITH D) 500-200 MG-UNIT tablet Take 1 tablet by mouth 2 (two) times daily.  Marland Kitchen Dextromethorphan-Guaifenesin (ROBITUSSIN COUGH+CHEST CONG DM) 5-100 MG/5ML LIQD Take 10 mLs by mouth every 6 (six) hours as needed.   . risperiDONE (RISPERDAL) 0.25 MG tablet Take 0.25 mg by mouth daily.  . risperiDONE (RISPERDAL) 1 MG/ML oral solution Take 0.5 mg by mouth at bedtime.   . [DISCONTINUED] metoprolol tartrate (LOPRESSOR) 25 MG tablet Take 6.25 mg by mouth 2 (two) times daily.    No facility-administered encounter medications on file as of 06/11/2017.     Review of Systems   Unattainable secondary to dementia please see history  of present illness  Immunization History  Administered Date(s) Administered  . Influenza-Unspecified 06/08/2014, 06/03/2016  . Pneumococcal-Unspecified 06/10/2016   Pertinent  Health Maintenance Due  Topic Date Due  . DEXA SCAN  06/17/2017 (Originally 06[birth year]/1991)  . INFLUENZA VACCINE  08/01/2017 (Originally 04/01/2017)  . PNA vac Low Risk Adult  Completed   Fall Risk  05/21/2017  Falls in the past year? Yes  Number falls in past yr: 1  Injury with Fall? Yes   Functional Status Survey:    Vitals:   06/11/17 1406  BP: 120/80   Pulse: 66  Resp: 20  Temp: 98.4 F (36.9 C)  TempSrc: Oral  SpO2: 96%    Physical Exam   In general this is a fairly well-nourished elderly female in no distress sitting in her wheelchair she is somewhat somnolent but easily arousable by tactile stimulation.  Her skin is warm and dry she is not diaphoretic.  Eyes she has prescription lenses she is holding her eyelids fairly tightly close she will do this at times t.  Oropharynx appears to be clear she does not open her mouth very wide.  Chest is clear to auscultation no labored breathing she has poor respiratory effort.  Heart is regular rate and rhythm slightly bradycardic in the 50s on auscultation she has baseline lower extremity edema.  Abdomen is soft nontender with positive bowel sounds.  Discussed skeletal is able to move all extremities 4 this appears to be baseline with lower extremity weakness which is not new.  Neurologic-again somewhat somnolent but easily arousable with tactile stimulation-could not really appreciate any lateralizing findings.  Psych she is oriented to self did not really follow verbal commands.  Was not agitated with exam    Labs reviewed:  Recent Labs  12/15/16 0700 02/05/17 0738 05/27/17 0955  NA 141 141 138  K 3.9 4.1 3.9  CL 107 109 103  CO2 27 23 24   GLUCOSE 91 86 142*  BUN 35* 41* 41*  CREATININE 1.89* 1.92* 1.91*  CALCIUM 8.8* 8.8* 8.8*    Recent Labs  12/08/16 0700 02/05/17 0738 05/27/17 0955  AST 13* 11* 14*  ALT 12* 10* 13*  ALKPHOS 68 72 74  BILITOT 0.4 0.5 0.4  PROT 6.2* 6.5 6.8  ALBUMIN 2.9* 3.1* 3.1*    Recent Labs  12/08/16 0700 02/05/17 0738 05/27/17 0955  WBC 9.6 8.1 6.9  NEUTROABS 6.6 4.8 4.3  HGB 11.2* 10.8* 10.7*  HCT 34.9* 34.1* 34.9*  MCV 88.8 87.2 88.8  PLT 189 229 212   Lab Results  Component Value Date   TSH 1.708 12/08/2016   Lab Results  Component Value Date   HGBA1C 5.2 07/14/2016   Lab Results  Component Value Date    CHOL 166 12/27/2015   HDL 44 12/27/2015   LDLCALC 114 (H) 12/27/2015   TRIG 39 12/27/2015   CHOLHDL 3.8 12/27/2015    Significant Diagnostic Results in last 30 days:  No results found.  Assessment/Plan  #1 dementia with behaviors-at times she does have intermittent somnolence-she has routine Xanax will change this to when necessary at this point continue Risperdal at current doses since she does have significant dementia behaviors -- the Risperdal  appears to have had a very beneficial effect-at this point will monitor.  [id number]

## 2017-06-18 DIAGNOSIS — M810 Age-related osteoporosis without current pathological fracture: Secondary | ICD-10-CM | POA: Diagnosis not present

## 2017-06-25 ENCOUNTER — Encounter: Payer: Self-pay | Admitting: Internal Medicine

## 2017-06-25 ENCOUNTER — Non-Acute Institutional Stay (SKILLED_NURSING_FACILITY): Payer: Medicare Other | Admitting: Internal Medicine

## 2017-06-25 DIAGNOSIS — F0391 Unspecified dementia with behavioral disturbance: Secondary | ICD-10-CM

## 2017-06-25 DIAGNOSIS — N184 Chronic kidney disease, stage 4 (severe): Secondary | ICD-10-CM

## 2017-06-25 DIAGNOSIS — D631 Anemia in chronic kidney disease: Secondary | ICD-10-CM

## 2017-06-25 DIAGNOSIS — I10 Essential (primary) hypertension: Secondary | ICD-10-CM

## 2017-06-25 DIAGNOSIS — R55 Syncope and collapse: Secondary | ICD-10-CM | POA: Diagnosis not present

## 2017-06-25 NOTE — Progress Notes (Signed)
Location:   [facility] Room Number: 106/D Place of Service:  SNF (31) Provider:  Gardiner Fanti, Rene Kocher, MD  Patient Care Team: Virgie Dad, MD as PCP - General (Internal Medicine) Virgie Dad, MD as Consulting Physician (Geriatric Medicine) Rolm Baptise as Physician Assistant (Internal Medicine)  Extended Emergency Contact Information Primary Emergency Contact: Neyda, [place], [place] [address] Johnnette Litter of [place] Phone: [phone] Mobile Phone: [phone] Relation: Daughter Secondary Emergency Contact: Shayne, [place], [place] [address] [place] of [place] Phone: [phone] Relation: Son  Code Status:  DNR Goals of care: Advanced Directive information Advanced Directives 06/25/2017  Does Patient Have a Medical Advance Directive? Yes  Type of Advance Directive Out of facility DNR (pink MOST or yellow form)  Does patient want to make changes to medical advance directive? No - Patient declined  Copy of [place] in Chart? No - copy requested  Pre-existing out of facility DNR order (yellow form or pink MOST form) -     Chief Complaint  Patient presents with  . Medical Management of Chronic Issues    Routine Visit  For medical management of chronic medical conditions including dementia with behaviors-hypertension-chronic kidney disease-anemia-history of syncope  HPI:  Pt is a 83 y.o. female seen today for medical management of chronic diseases. As noted above.  She continues to be quite stable and doing well with supportive care provided by nursing as well as her daughter who is very attentive and helps to feed her on a daily basis.  Her weight appears to be stable at 189 pounds.  She has severe dementia she was seen most recently for concerns of some sedation her Xanax was discontinued and sh  She continues onRisperdal twice a day for her behaviors and this has been quite  effective behaviors have not really increased since Xanax was discontinued.  Regards to hypertension she is no longer in any medication she was on beta blocker that was discontinued because of bradycardia blood pressures appear to be stable most recently 109/70 at times it is difficult to obtain a blood pressure secondary to agitation with invasive maneuvers.  She also has a history of chronic kidney disease creatinine most recently was 1.91 in late September and this is relatively baseline to actually slightly improved.  She also has a history of anemia most likely chronic disease this is been stable at 10.7.  She does have some history of a femur fracture is well she largely amputate the wheelchair she is not ambulatory-and this is complicated certainly with her significant dementia which is essentially made her nonambulatory.  She also has some history of recurrent syncope of unknown origin her daughter has not really desire any aggressive workup of this nonetheless this appears to have stabilized here over the last few weeks--- although at times again she will have these unprovoked and they're usually of short duration she quickly returns to her baseline       Past Medical History:  Diagnosis Date  . Alzheimer disease   . Anemia   . Anxiety   . Arthritis   . Hyperlipemia   . Hypertension   . Kidney calculus   . Pneumonia    Past Surgical History:  Procedure Laterality Date  . CHOLECYSTECTOMY    . HIP ARTHROPLASTY Left 11/18/2013   Procedure: ARTHROPLASTY BIPOLAR HIP;  Surgeon:  Sanjuana Kava, MD;  Location: [facility];  Service: Orthopedics;  Laterality: Left;    Allergies  Allergen Reactions  . Sulfa Antibiotics Rash    Outpatient Encounter Prescriptions as of 06/25/2017  Medication Sig  . ALPRAZolam (XANAX) 0.25 MG tablet Take one tablet by mouth twice daily for anxiety. Hold for sedation  . aspirin EC 81 MG tablet Take 81 mg by mouth daily.  . calcium-vitamin D (OSCAL WITH  D) 500-200 MG-UNIT tablet Take 1 tablet by mouth 2 (two) times daily.  Marland Kitchen Dextromethorphan-Guaifenesin (ROBITUSSIN COUGH+CHEST CONG DM) 5-100 MG/5ML LIQD Take 10 mLs by mouth every 6 (six) hours as needed.   . risperiDONE (RISPERDAL) 0.25 MG tablet Take 0.25 mg by mouth daily.  . risperiDONE (RISPERDAL) 1 MG/ML oral solution Take 0.5 mg by mouth at bedtime.    No facility-administered encounter medications on file as of 06/25/2017.      Review of Systems   Unattainable secondary to dementia please see history of present illness  Immunization History  Administered Date(s) Administered  . Influenza-Unspecified 06/08/2014, 06/03/2016, 06/05/2017  . Pneumococcal Conjugate-13 10/30/2015  . Pneumococcal-Unspecified 06/10/2016  . Tdap 05/22/2017   Pertinent  Health Maintenance Due  Topic Date Due  . INFLUENZA VACCINE  Completed  . DEXA SCAN  Completed  . PNA vac Low Risk Adult  Completed   Fall Risk  05/21/2017  Falls in the past year? Yes  Number falls in past yr: 1  Injury with Fall? Yes   Functional Status Survey:    Vitals:   06/25/17 1424  BP: 109/70  Pulse: 81  Resp: 20  Temp: 97.6 F (36.4 C)  TempSrc: Oral  SpO2: 94%  Weight: 189 lb (85.7 kg)  Height: 5\' 1"  (1.549 m)   Body mass index is 35.71 kg/m. Physical Exam  In general this is a well-developed elderly female in no distress lying commonly in bed.  Her skin is warm and dry.  Eyes she has prescription lenses visual acuity appears grossly intact sclera and lids are clear.  Chest is clear to auscultation with poor respiratory effort there is shallow air entry she does not really follow verbal commands.  Heart is regular rate and rhythm somewhat distant heart sounds this is somewhat complicated by patient verbalizing during exam.  Abdomen is soft nontender with positive bowel sounds.  Musculoskeletal she continues with mild bilateral lower extremity edema is able to move all extremities 4 with chronic  lower extremity weakness upper extremity strength appears preserved.  Neurologic as noted above she is alert somewhat agitated with exam could not really appreciate any lateralizing findings.  Psych again she does have significant dementia somewhat agitated with exam   Labs reviewed:  Recent Labs  12/15/16 0700 02/05/17 0738 05/27/17 0955  NA 141 141 138  K 3.9 4.1 3.9  CL 107 109 103  CO2 27 23 24   GLUCOSE 91 86 142*  BUN 35* 41* 41*  CREATININE 1.89* 1.92* 1.91*  CALCIUM 8.8* 8.8* 8.8*    Recent Labs  12/08/16 0700 02/05/17 0738 05/27/17 0955  AST 13* 11* 14*  ALT 12* 10* 13*  ALKPHOS 68 72 74  BILITOT 0.4 0.5 0.4  PROT 6.2* 6.5 6.8  ALBUMIN 2.9* 3.1* 3.1*    Recent Labs  12/08/16 0700 02/05/17 0738 05/27/17 0955  WBC 9.6 8.1 6.9  NEUTROABS 6.6 4.8 4.3  HGB 11.2* 10.8* 10.7*  HCT 34.9* 34.1* 34.9*  MCV 88.8 87.2 88.8  PLT 189 229 212   Lab  Results  Component Value Date   TSH 1.708 12/08/2016   Lab Results  Component Value Date   HGBA1C 5.2 07/14/2016   Lab Results  Component Value Date   CHOL 166 12/27/2015   HDL 44 12/27/2015   LDLCALC 114 (H) 12/27/2015   TRIG 39 12/27/2015   CHOLHDL 3.8 12/27/2015    Significant Diagnostic Results in last 30 days:  No results found.  Assessment/Plan  1 dementia with behaviors this appears to be stable despite being off the Xanax continues on Risperdal and appears to be tolerating this well-weight is stable she does eat well continue supportive care.  #2 hypertension this appears to be stable despite not being on any medication beta blocker was discontinued because of bradycardia.  #3 chronic kidney disease this appears stable with a creatinine of 1.91 on lab done in late September.  #4-history of anemia this is stable as well with a hemoglobin of 10.7 on the September lab.  #5 history of femur fracture again she is nonambulatory but this is mostly due to her significant dementia pain appears to be  controlled -- has not really been an issue.  #6 history of syncopes this is somewhat intermittent she has not had a syncopal episode now it appears in several weeks although this will occur spontaneously and resolves quite quickly as well --again daughter does not want aggressive workup.  [id number]

## 2017-07-30 ENCOUNTER — Encounter: Payer: Self-pay | Admitting: Internal Medicine

## 2017-07-30 ENCOUNTER — Non-Acute Institutional Stay (SKILLED_NURSING_FACILITY): Payer: Medicare Other | Admitting: Internal Medicine

## 2017-07-30 DIAGNOSIS — R001 Bradycardia, unspecified: Secondary | ICD-10-CM | POA: Diagnosis not present

## 2017-07-30 DIAGNOSIS — D631 Anemia in chronic kidney disease: Secondary | ICD-10-CM

## 2017-07-30 DIAGNOSIS — F0391 Unspecified dementia with behavioral disturbance: Secondary | ICD-10-CM | POA: Diagnosis not present

## 2017-07-30 DIAGNOSIS — N184 Chronic kidney disease, stage 4 (severe): Secondary | ICD-10-CM | POA: Diagnosis not present

## 2017-07-30 DIAGNOSIS — I10 Essential (primary) hypertension: Secondary | ICD-10-CM | POA: Diagnosis not present

## 2017-07-30 DIAGNOSIS — R55 Syncope and collapse: Secondary | ICD-10-CM

## 2017-07-30 DIAGNOSIS — F411 Generalized anxiety disorder: Secondary | ICD-10-CM | POA: Diagnosis not present

## 2017-07-30 NOTE — Progress Notes (Signed)
Location:   [facility] Room Number: 106/D Place of Service:  SNF (31) Provider:  Kyung Rudd, Rene Kocher, MD  Patient Care Team: Virgie Dad, MD as PCP - General (Internal Medicine) Virgie Dad, MD as Consulting Physician (Geriatric Medicine) Rolm Baptise as Physician Assistant (Internal Medicine)  Extended Emergency Contact Information Primary Emergency Contact: Aleshia, [place], [place] [address] Johnnette Litter of [place] Phone: [phone] Mobile Phone: [phone] Relation: Daughter Secondary Emergency Contact: Shealynn, [place], [place] [address] [place] of [place] Phone: [phone] Relation: Son  Code Status:  DNR Goals of care: Advanced Directive information Advanced Directives 07/30/2017  Does Patient Have a Medical Advance Directive? Yes  Type of Advance Directive Out of facility DNR (pink MOST or yellow form)  Does patient want to make changes to medical advance directive? No - Patient declined  Copy of [place] in Chart? No - copy requested  Pre-existing out of facility DNR order (yellow form or pink MOST form) -     Chief Complaint  Patient presents with  . Medical Management of Chronic Issues    Routine Visit    HPI:  Pt is a 83 y.o. female seen today for medical management of chronic diseases.    Patient has h/o Dementia with Behavior problems, Hypertension, CKD Stage 4, Anemia And h/O Femur fracture.  Patient has been very stable in the facility.  She did have some problems with somnolence which was resolved by stopping her Xanax.  She continues to be on low-dose of risperidone.  Patient weight has been stable at 185 pounds.  She has had no new nursing issues.  She was sitting comfortably in her wheelchair.  I talked to her daughter who is with her and she did not have any new complaints or issues for her  her mom.   Past Medical History:  Diagnosis Date  . Alzheimer  disease   . Anemia   . Anxiety   . Arthritis   . Hyperlipemia   . Hypertension   . Kidney calculus   . Pneumonia    Past Surgical History:  Procedure Laterality Date  . CHOLECYSTECTOMY    . HIP ARTHROPLASTY Left 11/18/2013   Procedure: ARTHROPLASTY BIPOLAR HIP;  Surgeon: Sanjuana Kava, MD;  Location: [facility];  Service: Orthopedics;  Laterality: Left;    Allergies  Allergen Reactions  . Sulfa Antibiotics Rash    Outpatient Encounter Medications as of 07/30/2017  Medication Sig  . aspirin EC 81 MG tablet Take 81 mg by mouth daily.  . calcium-vitamin D (OSCAL WITH D) 500-200 MG-UNIT tablet Take 1 tablet by mouth 2 (two) times daily.  Marland Kitchen Dextromethorphan-Guaifenesin (ROBITUSSIN COUGH+CHEST CONG DM) 5-100 MG/5ML LIQD Take 10 mLs by mouth every 6 (six) hours as needed.   . risperiDONE (RISPERDAL) 0.25 MG tablet Take 0.25 mg by mouth daily.  . risperiDONE (RISPERDAL) 1 MG/ML oral solution Take 0.5 mg by mouth at bedtime.   . [DISCONTINUED] ALPRAZolam (XANAX) 0.25 MG tablet Take one tablet by mouth twice daily for anxiety. Hold for sedation   No facility-administered encounter medications on file as of 07/30/2017.      Review of Systems  Unable to perform ROS: Dementia    Immunization History  Administered Date(s) Administered  . Influenza-Unspecified 06/08/2014, 06/03/2016, 06/05/2017  . Pneumococcal Conjugate-13 10/30/2015  . Pneumococcal-Unspecified 06/10/2016  . Tdap  05/22/2017   Pertinent  Health Maintenance Due  Topic Date Due  . INFLUENZA VACCINE  Completed  . DEXA SCAN  Completed  . PNA vac Low Risk Adult  Completed   Fall Risk  05/21/2017  Falls in the past year? Yes  Number falls in past yr: 1  Injury with Fall? Yes   Functional Status Survey:    Vitals:   07/30/17 1244  BP: 125/69  Pulse: 77  Resp: 20  Temp: (!) 97.2 F (36.2 C)  TempSrc: Oral  SpO2: 94%  Weight: 184 lb 9.6 oz (83.7 kg)  Height: 5\' 1"  (1.549 m)   Body mass index is 34.88  kg/m. Physical Exam  Constitutional: She appears well-developed and well-nourished.  HENT:  Head: Normocephalic.  Mouth/Throat: Oropharynx is clear and moist.  Eyes: Pupils are equal, round, and reactive to light.  Neck: Neck supple.  Cardiovascular: Normal rate.  Murmur heard. Pulmonary/Chest: Effort normal and breath sounds normal. No respiratory distress. She has no wheezes. She has no rales.  Abdominal: Soft. Bowel sounds are normal. She exhibits no distension. There is no tenderness. There is no rebound.  Musculoskeletal:  Chronic Venous stasis bilateral.  Lymphadenopathy:    She has no cervical adenopathy.  Neurological: She is alert.  Patient does not  to follows up any command.    Labs reviewed: Recent Labs    12/15/16 0700 02/05/17 0738 05/27/17 0955  NA 141 141 138  K 3.9 4.1 3.9  CL 107 109 103  CO2 27 23 24   GLUCOSE 91 86 142*  BUN 35* 41* 41*  CREATININE 1.89* 1.92* 1.91*  CALCIUM 8.8* 8.8* 8.8*   Recent Labs    12/08/16 0700 02/05/17 0738 05/27/17 0955  AST 13* 11* 14*  ALT 12* 10* 13*  ALKPHOS 68 72 74  BILITOT 0.4 0.5 0.4  PROT 6.2* 6.5 6.8  ALBUMIN 2.9* 3.1* 3.1*   Recent Labs    12/08/16 0700 02/05/17 0738 05/27/17 0955  WBC 9.6 8.1 6.9  NEUTROABS 6.6 4.8 4.3  HGB 11.2* 10.8* 10.7*  HCT 34.9* 34.1* 34.9*  MCV 88.8 87.2 88.8  PLT 189 229 212   Lab Results  Component Value Date   TSH 1.708 12/08/2016   Lab Results  Component Value Date   HGBA1C 5.2 07/14/2016   Lab Results  Component Value Date   CHOL 166 12/27/2015   HDL 44 12/27/2015   LDLCALC 114 (H) 12/27/2015   TRIG 39 12/27/2015   CHOLHDL 3.8 12/27/2015    Significant Diagnostic Results in last 30 days:  No results found.  Assessment/Plan  Essential hypertension Patient's blood pressure has been stable in spite of not being on any medicines.  Her metoprolol was stopped on last visit due to bradycardia.  Syncope  patient has history of syncope.  Cause of which  has not been worked up due to her age and dementia.  She has not had any new episodes recently.  Dementia with behavioral disturbance, Patient continued to remain continues to need supportive care.   She is on low-dose of Risperdal  CKD (chronic kidney disease), stage IV  Creatinine stable.  Labs are checked every few months  Anemia due to stage 4 chronic kidney disease  Hemoglobin has been stable  Bradycardia Resolved since metoprolol was DC'd.     Family/ staff Communication:   Labs/tests ordered:    Total time spent in this patient care encounter was _ minutes; greater than 50% of the visit spent with daughter,  reviewing records , Labs and coordinating care for problems addressed at this encounter.

## 2017-08-26 ENCOUNTER — Encounter: Payer: Self-pay | Admitting: Internal Medicine

## 2017-08-26 ENCOUNTER — Non-Acute Institutional Stay (SKILLED_NURSING_FACILITY): Payer: Medicare Other | Admitting: Internal Medicine

## 2017-08-26 DIAGNOSIS — F0391 Unspecified dementia with behavioral disturbance: Secondary | ICD-10-CM | POA: Diagnosis not present

## 2017-08-26 DIAGNOSIS — N184 Chronic kidney disease, stage 4 (severe): Secondary | ICD-10-CM | POA: Diagnosis not present

## 2017-08-26 DIAGNOSIS — D631 Anemia in chronic kidney disease: Secondary | ICD-10-CM

## 2017-08-26 DIAGNOSIS — I10 Essential (primary) hypertension: Secondary | ICD-10-CM | POA: Diagnosis not present

## 2017-08-26 DIAGNOSIS — R55 Syncope and collapse: Secondary | ICD-10-CM

## 2017-08-26 NOTE — Progress Notes (Signed)
This is a routine visit.  Level care skilled.  Facility is [facility].  Chief complaint-routine visit for medical management of chronic medical conditions including advanced dementia-chronic kidney disease-anemia-history of femur fracture-hypertension-.  History of present illness.  Patient is a 83 year old female with the above diagnoses- she appears to be in the period of stability she does have advanced dementia and is followed by psychiatric services behaviors were significant issue but it appears low dose Risperdal.  Twice daily has been quite effective her weight continues to be stable she appears to have good appetite she does have a very supportive daughter  .  She also has a history of chronic kidney disease which is been relatively stable most recent creatinine 1.91 BUN of 31 back in September shows relative stability we will try to update this although sometimes blood draws are challenged with patient's agitation.  Regards to hypertension she is not on any blood loss this appears stable recent blood pressure is 122/72-140/69.  Regards to anemia most likely chronic disease with her kidney as she is has been stable as well with 10.7 on lab done in September.  At times she does have a history of what is thought to be syncope-where she becomes unresponsive for short period of time and then fairly quickly becomes back to her baseline-family has not wanted any aggressive workup of this-basically want her to have comfortable- with actually has been sometime since he has had an episode---   Also has a history of a femur fractu re---spends most of her time either in bed or in her wheelchair   Past Medical History:  Diagnosis Date  . Alzheimer disease   . Anemia   . Anxiety   . Arthritis   . Hyperlipemia   . Hypertension   . Kidney calculus   . Pneumonia         Past Surgical History:  Procedure Laterality Date  . CHOLECYSTECTOMY    . HIP ARTHROPLASTY Left  11/18/2013   Procedure: ARTHROPLASTY BIPOLAR HIP;  Surgeon: Sanjuana Kava, MD;  Location: [facility];  Service: Orthopedics;  Laterality: Left;        Allergies  Allergen Reactions  . Sulfa Antibiotics Rash           Medication Sig  . aspirin EC 81 MG tablet Take 81 mg by mouth daily.  . calcium-vitamin D (OSCAL WITH D) 500-200 MG-UNIT tablet Take 1 tablet by mouth 2 (two) times daily.  Marland Kitchen Dextromethorphan-Guaifenesin (ROBITUSSIN COUGH+CHEST CONG DM) 5-100 MG/5ML LIQD Take 10 mLs by mouth every 6 (six) hours as needed.   . risperiDONE (RISPERDAL) 0.25 MG tablet Take 0.25 mg by mouth daily.  . risperiDONE (RISPERDAL) 1 MG/ML oral solution Take 0.5 mg by mouth at bedtime.        Review of Systems  Unable to perform ROS: Dementia --please see HPI per nursing staff is stable no recent acute issues       Immunization History  Administered Date(s) Administered  . Influenza-Unspecified 06/08/2014, 06/03/2016, 06/05/2017  . Pneumococcal Conjugate-13 10/30/2015  . Pneumococcal-Unspecified 06/10/2016  . Tdap 05/22/2017       Pertinent  Health Maintenance Due  Topic Date Due  . INFLUENZA VACCINE  Completed  . DEXA SCAN  Completed  . PNA vac Low Risk Adult  Completed   Fall Risk  05/21/2017  Falls in the past year? Yes  Number falls in past yr: 1  Injury with Fall? Yes   Physical exam.  Temperature 97.6 pulse  680espirations 16 blood pressure 122/72 weight is stable at 184.2 pounds.  In general this is a well-developed elderly female in no distress lying comfortably in bed.  Her skin is warm and dry.  Eyes sclera and conjunctive are clear visual acuity appears grossly intact she has prescription lenses.  Oropharynx is clear mucous membranes moist.  Chest is clear to auscultation with poor respiratory effort she does not really follow verbal commands but there is no labored breathing.  Heart is regular rate and rhythm without murmur gallop or rub she does have a  few skipped beats at times she has chronic venous stasis changes lower extremities which appear baseline.  Abdomen is obese soft nontender with positive bowel sounds.  Musculoskeletal Limited exam since she is in bed but appears able to move all her extremities x4 actually upper extremity strength appears to be quite strong.  Neurologic as noted above no lateralizing findings her speech is nonsensical.  Psych findings compatible with significant dementia however she is not agitated much with exam this evening and did allow me to hear her heart and lungs.  Labs.  May 27, 2017.  Sodium 138 potassium 3.9 BUN 41 creatinine 1.91.  Albumin 3.1-AST 14-ALT 13- otherwise liver function tests within normal limits.  WBC 6.9 hemoglobin 10.7 platelets 212  Assessment plan.  Advanced dementia-this appears stable her weight is stable she appears to eat well behaviors are well controlled on the Risperdal at this point will monitor.  2.  History of chronic kidney disease but this appears relatively stable per recent labs with a creatinine of 1.91 will update this when patient will allow lab draws.  3.  Anemia most likely chronic disease but this has been stable as well with most recent hemoglobin 10.7-also will attempt update this when patient will allow   #4 history of hypertension currently on no medications nonetheless as stated above this appears stable metoprolol was discontinued previously secondary to bradycardic readings.  5.  History of  Left  femur fracture she appears to be doing well with supportive care she is largely nonambulatory   #6-history of syncope-again as noted above these have been intermittent and she quickly returned to baseline no aggressive workup has been performed secondary to family wishes for conservative follow-up.  [id number]

## 2017-08-27 ENCOUNTER — Encounter (HOSPITAL_COMMUNITY)
Admission: RE | Admit: 2017-08-27 | Discharge: 2017-08-27 | Disposition: A | Discharge status: Home / Self Care | Payer: Medicare Other | Source: Skilled Nursing Facility | Attending: Internal Medicine | Admitting: Internal Medicine

## 2017-08-27 DIAGNOSIS — D649 Anemia, unspecified: Secondary | ICD-10-CM | POA: Diagnosis present

## 2017-08-27 LAB — COMPREHENSIVE METABOLIC PANEL
ALBUMIN: 3.5 g/dL (ref 3.5–5.0)
ALK PHOS: 85 U/L (ref 38–126)
ALT: 31 U/L (ref 14–54)
ANION GAP: 11 (ref 5–15)
AST: 15 U/L (ref 15–41)
BUN: 36 mg/dL — ABNORMAL HIGH (ref 6–20)
CALCIUM: 9.1 mg/dL (ref 8.9–10.3)
CO2: 24 mmol/L (ref 22–32)
Chloride: 106 mmol/L (ref 101–111)
Creatinine, Ser: 1.81 mg/dL — ABNORMAL HIGH (ref 0.44–1.00)
GFR calc Af Amer: 27 mL/min — ABNORMAL LOW (ref >60)
GFR calc non Af Amer: 23 mL/min — ABNORMAL LOW (ref >60)
Glucose, Bld: 104 mg/dL — ABNORMAL HIGH (ref 65–99)
Potassium: 4.2 mmol/L (ref 3.5–5.1)
Sodium: 141 mmol/L (ref 135–145)
Total Bilirubin: 0.4 mg/dL (ref 0.3–1.2)
Total Protein: 7.4 g/dL (ref 6.5–8.1)

## 2017-08-27 LAB — CBC WITH DIFFERENTIAL/PLATELET
BASOS PCT: 1 %
Basophils Absolute: 0.1 10*3/uL (ref 0.0–0.1)
Eosinophils Absolute: 0.4 10*3/uL (ref 0.0–0.7)
Eosinophils Relative: 4 %
HCT: 39 % (ref 36.0–46.0)
HEMOGLOBIN: 12 g/dL (ref 12.0–15.0)
Lymphocytes Relative: 29 %
Lymphs Abs: 2.9 10*3/uL (ref 0.7–4.0)
MCH: 27.6 pg (ref 26.0–34.0)
MCHC: 30.8 g/dL (ref 30.0–36.0)
MCV: 89.7 fL (ref 78.0–100.0)
Monocytes Absolute: 0.5 10*3/uL (ref 0.1–1.0)
Monocytes Relative: 5 %
NEUTROS PCT: 61 %
Neutro Abs: 5.9 10*3/uL (ref 1.7–7.7)
Platelets: 320 10*3/uL (ref 150–400)
RBC: 4.35 MIL/uL (ref 3.87–5.11)
RDW: 13.3 % (ref 11.5–15.5)
WBC: 9.7 10*3/uL (ref 4.0–10.5)

## 2017-09-03 DIAGNOSIS — B351 Tinea unguium: Secondary | ICD-10-CM | POA: Diagnosis not present

## 2017-09-03 DIAGNOSIS — I739 Peripheral vascular disease, unspecified: Secondary | ICD-10-CM | POA: Diagnosis not present

## 2017-09-17 ENCOUNTER — Encounter: Payer: Self-pay | Admitting: Internal Medicine

## 2017-09-17 ENCOUNTER — Non-Acute Institutional Stay (SKILLED_NURSING_FACILITY): Payer: Medicare Other | Admitting: Internal Medicine

## 2017-09-17 DIAGNOSIS — F0391 Unspecified dementia with behavioral disturbance: Secondary | ICD-10-CM | POA: Diagnosis not present

## 2017-09-17 DIAGNOSIS — D631 Anemia in chronic kidney disease: Secondary | ICD-10-CM

## 2017-09-17 DIAGNOSIS — I10 Essential (primary) hypertension: Secondary | ICD-10-CM

## 2017-09-17 DIAGNOSIS — N184 Chronic kidney disease, stage 4 (severe): Secondary | ICD-10-CM | POA: Diagnosis not present

## 2017-09-17 DIAGNOSIS — R55 Syncope and collapse: Secondary | ICD-10-CM

## 2017-09-17 NOTE — Progress Notes (Signed)
Location:   [facility] Room Number: 106/D Place of Service:  SNF (31) Provider:  Freddi Starr, MD  Patient Care Team: Virgie Dad, MD as PCP - General (Internal Medicine) Virgie Dad, MD as Consulting Physician (Geriatric Medicine) Rolm Baptise as Physician Assistant (Internal Medicine)  Extended Emergency Contact Information Primary Emergency Contact: Lorraine, [place], [place] [address] [address]nette Litter of [place] Phone: [phone] Mobile Phone: [phone] Relation: Daughter Secondary Emergency Contact: Lorraine, [place], [place] [address] [place] of [place] Phone: [phone] Relation: Son  Code Status:  DNR Goals of care: Advanced Directive information Advanced Directives 09/17/2017  Does Patient Have a Medical Advance Directive? Yes  Type of Advance Directive Out of facility DNR (pink MOST or yellow form)  Does patient want to make changes to medical advance directive? No - Patient declined  Copy of [place] in Chart? No - copy requested  Pre-existing out of facility DNR order (yellow form or pink MOST form) -     Chief Complaint  Patient presents with  . Medical Management of Chronic Issues    Routine Visit   For medical management of chronic medical issues including dementia-chronic kidney disease-anemia-hypertension- history of left femur fracture-history of syncope HPI:  [name] seen today for medical management of chronic diseases.  As noted above.  She continues to be quite stable she is actually gained a few pounds I suspect this is because of her good appetite- nursing staff does not report any acute issues she does appear to have some progressive dementia per discussion with her daughter who is very supportive.  She is followed by psychiatric services at one point behaviors were quite significant but this has really calm down she is on low-dose  Risperdal twice a day.  She also has a history of chronic kidney disease on labs done last month creatinine showed stability at 1.81 with a BUN of 36- she also has a history of hypertension this appears stable at times her systolics are in the 858I and 150s by suspects she does have agitation at times with invasive maneuvers blood pressure today is stable at 118/64.  She also has a history of anemia most likely due to her chronic kidney disease but this actually showed improvement with a hemoglobin of 12 on lab done last month.  Previously she has had at times some syncopal episodes although she has not had one for significant amount of time-during these episodes he becomes unresponsive for a very short period and then fairly quickly gets back to her baseline-family does not want any aggressive workup of this basically they want her to be comfortable.  She also has a history of a femur fracture but she is largely nonambulatory and spends most of her time either in bed or in a wheelchair.--Pain does not appear to be an issue     Past Medical History:  Diagnosis Date  . Alzheimer disease   . Anemia   . Anxiety   . Arthritis   . Hyperlipemia   . Hypertension   . Kidney calculus   . Pneumonia    Past Surgical History:  Procedure Laterality Date  . CHOLECYSTECTOMY    . HIP ARTHROPLASTY Left 11/18/2013   Procedure: ARTHROPLASTY BIPOLAR HIP;  Surgeon: Sanjuana Kava, MD;  Location: [facility];  Service: Orthopedics;  Laterality: Left;    Allergies  Allergen Reactions  . Sulfa Antibiotics Rash    Outpatient Encounter Medications as of 09/17/2017  Medication Sig  . aspirin EC 81 MG tablet Take 81 mg by mouth daily.  . calcium-vitamin D (OSCAL WITH D) 500-200 MG-UNIT tablet Take 1 tablet by mouth 2 (two) times daily.  Marland Kitchen Dextromethorphan-Guaifenesin (ROBITUSSIN COUGH+CHEST CONG DM) 5-100 MG/5ML LIQD Take 10 mLs by mouth every 6 (six) hours as needed.   . risperiDONE (RISPERDAL) 0.25 MG tablet  Take 0.25 mg by mouth daily.  . risperiDONE (RISPERDAL) 1 MG/ML oral solution Take 0.5 mg by mouth at bedtime.    No facility-administered encounter medications on file as of 09/17/2017.      Review of Systems   Essentially unobtainable secondary to dementia please see HPI  Immunization History  Administered Date(s) Administered  . Influenza-Unspecified 06/08/2014, 06/03/2016, 06/05/2017  . Pneumococcal Conjugate-13 10/30/2015  . Pneumococcal-Unspecified 06/10/2016  . Tdap 05/22/2017   Pertinent  Health Maintenance Due  Topic Date Due  . INFLUENZA VACCINE  Completed  . DEXA SCAN  Completed  . PNA vac Low Risk Adult  Completed   Fall Risk  05/21/2017  Falls in the past year? Yes  Number falls in past yr: 1  Injury with Fall? Yes   Functional Status Survey:    Vitals:   09/17/17 1502  BP: 118/64  Pulse: 74  Resp: 20  Temp: (!) 97.5 F (36.4 C)  TempSrc: Oral  SpO2: 94%  Weight: 189 lb 12.8 oz (86.1 kg)  Height: 5\' 1"  (1.549 m)   Body mass index is 35.86 kg/m. Physical Exam   In general this is a well-nourished elderly female in no distress lying comfortably in her bed.  Her skin is warm and dry.  Eyes she has prescription lenses sclera and conjunctive are clear visual acuity appears intact.  Oropharynx was difficult to assess secondary patient not opening her mouth.  The chest is clear to auscultation with poor respiratory effort she does not really follow verbal commands there is no sign of labored breathing.  Heart is regular rate and rhythm without murmur gallop or rub she has chronic venous stasis changes lower extremities which appear unchanged.  Her abdomen is obese soft nontender with positive bowel sounds.  Musculoskeletal is able to move all extremities x4 upper extremity strength appears intact lower extremity strength difficult to fully assess since she is in bed.  She is able to move her legs.  Neurologic as noted above she is  alert.  Psyche--has significant dementia but is not overtly agitated with exam this evening she does not follow any verbal commands  Labs reviewed: Recent Labs    02/05/17 0738 05/27/17 0955 08/27/17 0900  NA 141 138 141  K 4.1 3.9 4.2  CL 109 103 106  CO2 23 24 24   GLUCOSE 86 142* 104*  BUN 41* 41* 36*  CREATININE 1.92* 1.91* 1.81*  CALCIUM 8.8* 8.8* 9.1   Recent Labs    02/05/17 0738 05/27/17 0955 08/27/17 0900  AST 11* 14* 15  ALT 10* 13* 31  ALKPHOS 72 74 85  BILITOT 0.5 0.4 0.4  PROT 6.5 6.8 7.4  ALBUMIN 3.1* 3.1* 3.5   Recent Labs    02/05/17 0738 05/27/17 0955 08/27/17 0900  WBC 8.1 6.9 9.7  NEUTROABS 4.8 4.3 5.9  HGB 10.8* 10.7* 12.0  HCT 34.1* 34.9* 39.0  MCV 87.2 88.8 89.7  PLT 229 212 320   Lab Results  Component Value Date   TSH 1.708 12/08/2016   Lab Results  Component Value Date   HGBA1C 5.2 07/14/2016   Lab Results  Component Value Date   CHOL 166 12/27/2015   HDL 44 12/27/2015   LDLCALC 114 (H) 12/27/2015   TRIG 39 12/27/2015   CHOLHDL 3.8 12/27/2015    Significant Diagnostic Results in last 30 days:  No results found.  Assessment/Plan  #1-history of advanced dementia she appears to be doing well with supportive care behaviors appear to be controlled --she is on low-dose Risperdal twice a day--and followed by psychiatric services-weight appears to be stable to slowly increase she does eat quite well.--CBGs have been in the 90s to low 100s- these are checked because she is on Risperdal   2.  History of chronic kidney disease this appears stabilized with a creatinine of 1.81 on lab done last month will monitor periodically we have tried to minimize lab draws secondary to family desires for more comfort care as well as patient's agitation with lab draws.  3.  Anemia most likely due to renal disease this is stable as noted above with a hemoglobin of 12.  4.  History of hypertension this appears stable she is not currently on any  medications for this.  5.  History of left femur fracture she does not appear to have any discomfort again she is largely nonambulatory- she is on calcium with vitamin D.  6.  History of syncope again she has not had an episode in some time as noted above family does not want aggressive workup  [phone]

## 2017-10-13 ENCOUNTER — Non-Acute Institutional Stay (SKILLED_NURSING_FACILITY): Payer: Medicare Other | Admitting: Internal Medicine

## 2017-10-13 ENCOUNTER — Encounter: Payer: Self-pay | Admitting: Internal Medicine

## 2017-10-13 DIAGNOSIS — R55 Syncope and collapse: Secondary | ICD-10-CM | POA: Diagnosis not present

## 2017-10-13 DIAGNOSIS — I10 Essential (primary) hypertension: Secondary | ICD-10-CM | POA: Diagnosis not present

## 2017-10-13 DIAGNOSIS — N184 Chronic kidney disease, stage 4 (severe): Secondary | ICD-10-CM

## 2017-10-13 DIAGNOSIS — F0391 Unspecified dementia with behavioral disturbance: Secondary | ICD-10-CM

## 2017-10-13 NOTE — Progress Notes (Signed)
Location:   [facility] Room Number: 106/D Place of Service:  SNF (31) Provider:  Clydene Fake, MD  Patient Care Team: Virgie Dad, MD as PCP - General (Internal Medicine) Virgie Dad, MD as Consulting Physician (Geriatric Medicine) Rolm Baptise as Physician Assistant (Internal Medicine)  Extended Emergency Contact Information Primary Emergency Contact: Joelle, [place], [place] [address] Johnnet[address] Litter of [place] Phone: [phone] Mobile Phone: [phone] Relation: Daughter Secondary Emergency Contact: Datra, [place], [place] [address] [place] of [place] Phone: [phone] Relation: Son  Code Status:  DNR Goals of care: Advanced Directive information Advanced Directives 10/13/2017  Does Patient Have a Medical Advance Directive? Yes  Type of Advance Directive Out of facility DNR (pink MOST or yellow form)  Does patient want to make changes to medical advance directive? No - Patient declined  Copy of [place] in Chart? No - copy requested  Pre-existing out of facility DNR order (yellow form or pink MOST form) -     Chief Complaint  Patient presents with  . Medical Management of Chronic Issues    Routine Visit    HPI:  [name] seen today for medical management of chronic diseases.    Patient has h/o Dementia with Behavior problems, Hypertension, CKD Stage 4, Anemia And h/O Femur fracture. A long-term resident of the facility.  She has not had any acute issues.  No new nursing issues.  She did have some somnolence few months ago which resolved by stopping her Xanax.  She continues to be on low-dose of Risperdal.  Her weight has been stable at 186 pounds.  Her daughter comes and visits her every day and she did not have any new complaints either  Past Medical History:  Diagnosis Date  . Alzheimer disease   . Anemia   . Anxiety   . Arthritis   .  Hyperlipemia   . Hypertension   . Kidney calculus   . Pneumonia    Past Surgical History:  Procedure Laterality Date  . CHOLECYSTECTOMY    . HIP ARTHROPLASTY Left 11/18/2013   Procedure: ARTHROPLASTY BIPOLAR HIP;  Surgeon: Sanjuana Kava, MD;  Location: [facility];  Service: Orthopedics;  Laterality: Left;    Allergies  Allergen Reactions  . Sulfa Antibiotics Rash    Outpatient Encounter Medications as of 10/13/2017  Medication Sig  . aspirin EC 81 MG tablet Take 81 mg by mouth daily.  . calcium-vitamin D (OSCAL WITH D) 500-200 MG-UNIT tablet Take 1 tablet by mouth 2 (two) times daily.  Marland Kitchen Dextromethorphan-Guaifenesin (ROBITUSSIN COUGH+CHEST CONG DM) 5-100 MG/5ML LIQD Take 10 mLs by mouth every 6 (six) hours as needed.   . risperiDONE (RISPERDAL) 0.25 MG tablet Take 0.25 mg by mouth daily.  . risperiDONE (RISPERDAL) 1 MG/ML oral solution Take 0.5 mg by mouth at bedtime.    No facility-administered encounter medications on file as of 10/13/2017.      Review of Systems  Unable to perform ROS: Dementia    Immunization History  Administered Date(s) Administered  . Influenza-Unspecified 06/08/2014, 06/03/2016, 06/05/2017  . Pneumococcal Conjugate-13 10/30/2015  . Pneumococcal-Unspecified 06/10/2016  . Tdap 05/22/2017   Pertinent  Health Maintenance Due  Topic Date Due  . INFLUENZA VACCINE  Completed  . DEXA SCAN  Completed  . PNA vac Low Risk Adult  Completed  Fall Risk  05/21/2017  Falls in the past year? Yes  Number falls in past yr: 1  Injury with Fall? Yes   Functional Status Survey:    Vitals:   10/13/17 1016  BP: 140/84  Pulse: 78  Resp: 20  Temp: 98 F (36.7 C)  TempSrc: Oral  SpO2: 96%  Weight: 186 lb 6.4 oz (84.6 kg)  Height: 5\' 1"  (1.549 m)   Body mass index is 35.22 kg/m. Physical Exam  Constitutional: She appears well-developed and well-nourished.  HENT:  Head: Normocephalic.  Mouth/Throat: Oropharynx is clear and moist.  Eyes: Pupils are equal,  round, and reactive to light.  Neck: Neck supple.  Cardiovascular: Normal rate.  Murmur heard. Pulmonary/Chest: Effort normal and breath sounds normal. No respiratory distress. She has no wheezes. She has no rales.  Abdominal: Soft. Bowel sounds are normal. She exhibits no distension. There is no tenderness. There is no rebound.  Musculoskeletal: She exhibits edema.  Neurological: She is alert.  Does not follow any commands  Skin: Skin is warm and dry.  Psychiatric: She has a normal mood and affect. Her behavior is normal.    Labs reviewed: Recent Labs    02/05/17 0738 05/27/17 0955 08/27/17 0900  NA 141 138 141  K 4.1 3.9 4.2  CL 109 103 106  CO2 23 24 24   GLUCOSE 86 142* 104*  BUN 41* 41* 36*  CREATININE 1.92* 1.91* 1.81*  CALCIUM 8.8* 8.8* 9.1   Recent Labs    02/05/17 0738 05/27/17 0955 08/27/17 0900  AST 11* 14* 15  ALT 10* 13* 31  ALKPHOS 72 74 85  BILITOT 0.5 0.4 0.4  PROT 6.5 6.8 7.4  ALBUMIN 3.1* 3.1* 3.5   Recent Labs    02/05/17 0738 05/27/17 0955 08/27/17 0900  WBC 8.1 6.9 9.7  NEUTROABS 4.8 4.3 5.9  HGB 10.8* 10.7* 12.0  HCT 34.1* 34.9* 39.0  MCV 87.2 88.8 89.7  PLT 229 212 320   Lab Results  Component Value Date   TSH 1.708 12/08/2016   Lab Results  Component Value Date   HGBA1C 5.2 07/14/2016   Lab Results  Component Value Date   CHOL 166 12/27/2015   HDL 44 12/27/2015   LDLCALC 114 (H) 12/27/2015   TRIG 39 12/27/2015   CHOLHDL 3.8 12/27/2015    Significant Diagnostic Results in last 30 days:  No results found.  Assessment/Plan  Essential hypertension Blood pressure mildly elevated today. She is not on any medicines We will continue to monitor Syncope Patient has a history of syncope. Because has not been worked up due to her age and dementia No new episodes recently Dementia with behavior disturbances Continue to remain stable.  Needs 24-hour supportive care She is on low-dose of Risperdal CKD stage IV Creatinine   stable. Anemia due to  CKD Hemoglobin has been stable Bradycardia Resolved since metoprolol was DC'd   Family/ staff Communication:   Labs/tests ordered:    Total time spent in this patient care encounter was 25_ minutes; greater than 50% of the visit spent  reviewing records , Labs and coordinating care for problems addressed at this encounter.

## 2017-11-05 ENCOUNTER — Encounter: Payer: Self-pay | Admitting: Internal Medicine

## 2017-11-05 ENCOUNTER — Non-Acute Institutional Stay (SKILLED_NURSING_FACILITY): Payer: Medicare Other | Admitting: Internal Medicine

## 2017-11-05 DIAGNOSIS — I10 Essential (primary) hypertension: Secondary | ICD-10-CM | POA: Diagnosis not present

## 2017-11-05 DIAGNOSIS — R55 Syncope and collapse: Secondary | ICD-10-CM

## 2017-11-05 DIAGNOSIS — D631 Anemia in chronic kidney disease: Secondary | ICD-10-CM

## 2017-11-05 DIAGNOSIS — N184 Chronic kidney disease, stage 4 (severe): Secondary | ICD-10-CM | POA: Diagnosis not present

## 2017-11-05 DIAGNOSIS — F0391 Unspecified dementia with behavioral disturbance: Secondary | ICD-10-CM

## 2017-11-05 NOTE — Progress Notes (Signed)
Location:   [facility] Room Number: 106/D Place of Service:  SNF (31) Provider:  Freddi Starr, MD  Patient Care Team: Virgie Dad, MD as PCP - General (Internal Medicine) Virgie Dad, MD as Consulting Physician (Geriatric Medicine) Rolm Baptise as Physician Assistant (Internal Medicine)  Extended Emergency Contact Information Primary Emergency Contact: Jerica, [place], [place] [address] Johnnette Litter of [place] Phone: [phone] Mobile Phone: [phone] Relation: Daughter Secondary Emergency Contact: Vaneta, [place], [place] [address] [place] of [place] Phone: [phone] Relation: Son  Code Status:  DNR Goals of care: Advanced Directive information Advanced Directives 11/05/2017  Does Patient Have a Medical Advance Directive? Yes  Type of Advance Directive Out of facility DNR (pink MOST or yellow form)  Does patient want to make changes to medical advance directive? No - Patient declined  Copy of [place] in Chart? No - copy requested  Pre-existing out of facility DNR order (yellow form or pink MOST form) -     Chief Complaint  Patient presents with  . Medical Management of Chronic Issues    Routine Visit  For medical management of chronic medical issues including dementia-hypertension-chronic kidney disease-anemia-history of fever fracture-history of syncope  HPI:  [name] seen today for medical management of chronic diseases.  As noted above.  Patient with a significant history of severe end-stage dementia who has been a long-term resident of facility.  Despite her significant dementia she appears to be doing well she continues with a good appetite and weight has been stable largely in the upper 180s.  At one point behaviors were a significant issue but this has really stabilized on low-dose Risperdal twice a day- one point she was on Xanax but this was  sedating and has been discontinued.  She does have a history of hypertension currently on no medications but this appears to be stable with blood pressure 110/60- occasionally she will have a few spikes but I suspect this is more agitation and anxiety related.  She also has a history of chronic kidney disease stage IV which has been stable most recent lab in late December 2018 showed creatinine of 1.81 and BUN of 36--.  In regards to anemia most likely chronic disease this is quite stable with a hemoglobin of 12 on the December lab.  She does have some history of syncope in the past of unknown etiology family does not desire aggressive workup of this secondary to her advanced dementia and advanced age-nonetheless this actually has not occurred for some time.  These episodes usually were of short duration she usually quickly returned to her baseline.  Currently she is lying in bed being fed by the nursing tech--it appears she is eating well- again nursing does not report any issues.  She does have a very supportive daughter as well who assists with her care and feeding.     Past Medical History:  Diagnosis Date  . Alzheimer disease   . Anemia   . Anxiety   . Arthritis   . Hyperlipemia   . Hypertension   . Kidney calculus   . Pneumonia    Past Surgical History:  Procedure Laterality Date  . CHOLECYSTECTOMY    . HIP ARTHROPLASTY Left 11/18/2013   Procedure: ARTHROPLASTY BIPOLAR HIP;  Surgeon: Sanjuana Kava, MD;  Location: [facility];  Service: Orthopedics;  Laterality: Left;    Allergies  Allergen Reactions  . Sulfa Antibiotics Rash    Outpatient Encounter Medications as of 11/05/2017  Medication Sig  . aspirin EC 81 MG tablet Take 81 mg by mouth daily.  . calcium-vitamin D (OSCAL WITH D) 500-200 MG-UNIT tablet Take 1 tablet by mouth 2 (two) times daily.  Marland Kitchen Dextromethorphan-Guaifenesin (ROBITUSSIN COUGH+CHEST CONG DM) 5-100 MG/5ML LIQD Take 10 mLs by mouth every 6 (six) hours as  needed.   . risperiDONE (RISPERDAL) 0.25 MG tablet Take 0.25 mg by mouth daily.  . risperiDONE (RISPERDAL) 1 MG/ML oral solution Take 0.5 mg by mouth at bedtime.    No facility-administered encounter medications on file as of 11/05/2017.      Review of Systems   Unobtainable secondary to dementia please see HPI  Immunization History  Administered Date(s) Administered  . Influenza-Unspecified 06/08/2014, 06/03/2016, 06/05/2017  . Pneumococcal Conjugate-13 10/30/2015  . Pneumococcal-Unspecified 06/10/2016  . Tdap 05/22/2017   Pertinent  Health Maintenance Due  Topic Date Due  . INFLUENZA VACCINE  Completed  . DEXA SCAN  Completed  . PNA vac Low Risk Adult  Completed   Fall Risk  05/21/2017  Falls in the past year? Yes  Number falls in past yr: 1  Injury with Fall? Yes   Functional Status Survey:    Vitals:   11/05/17 1613  BP: 110/60  Pulse: 78  Resp: 20  Temp: 98 F (36.7 C)  TempSrc: Oral  SpO2: 96%  Weight: 189 lb 9.6 oz (86 kg)  Height: 5\' 1"  (1.549 m)   Body mass index is 35.82 kg/m. Physical Exam  In general this is a well-nourished elderly female in no distress lying comfortably in bed she is actually being fed her supper.  Her skin is warm and dry.  Eyes she has prescription lenses visual acuity appears to be intact her sclera conjunctive are clear.  Oropharynx is clear mucous membranes moist.  Chest is clear to auscultation with poor respiratory effort-there is no labored breathing.  Heart continues to be irregular rate and rhythm without murmur gallop or rub has some mild venous stasis changes bilaterally that appear to be relatively baseline.  Her abdomen is obese soft nontender with positive bowel sounds.  Musculoskeletal does move all extremities x4 limited exam since she is in bed-but this appears to be at baseline upper extremity strength appears preserved.  Neurologic as noted above I could not really appreciate lateralizing findings she is  alert but does not follow verbal commands secondary to severe dementia.  Cranial nerves appear to be intact.  Psych she has significant dementia and does not again follow verbal commands- she is somewhat agitated with exam which is relatively baseline with  Labs reviewed: Recent Labs    02/05/17 0738 05/27/17 0955 08/27/17 0900  NA 141 138 141  K 4.1 3.9 4.2  CL 109 103 106  CO2 23 24 24   GLUCOSE 86 142* 104*  BUN 41* 41* 36*  CREATININE 1.92* 1.91* 1.81*  CALCIUM 8.8* 8.8* 9.1   Recent Labs    02/05/17 0738 05/27/17 0955 08/27/17 0900  AST 11* 14* 15  ALT 10* 13* 31  ALKPHOS 72 74 85  BILITOT 0.5 0.4 0.4  PROT 6.5 6.8 7.4  ALBUMIN 3.1* 3.1* 3.5   Recent Labs    02/05/17 0738 05/27/17 0955 08/27/17 0900  WBC 8.1 6.9 9.7  NEUTROABS 4.8 4.3 5.9  HGB 10.8* 10.7* 12.0  HCT 34.1*  34.9* 39.0  MCV 87.2 88.8 89.7  PLT 229 212 320   Lab Results  Component Value Date   TSH 1.708 12/08/2016   Lab Results  Component Value Date   HGBA1C 5.2 07/14/2016   Lab Results  Component Value Date   CHOL 166 12/27/2015   HDL 44 12/27/2015   LDLCALC 114 (H) 12/27/2015   TRIG 39 12/27/2015   CHOLHDL 3.8 12/27/2015    Significant Diagnostic Results in last 30 days:  No results found.  Assessment/Plan  #1 history of dementia with behaviors-this appears stabilized on the low-dose Risperdal twice a day --her appetite continues to be very good weight has been stable- we have monitor CBGs q. monthly because of her Risperdal -- these appears to be stable in the 90s- low100's  2 hypertension not on medications nonetheless this appears stable as noted above.  3.  History of chronic kidney disease stage IV this appears stable as well with a creatinine of 1.81 BUN of 36 in late December lab we will try to monitor this periodically but have been conservative secondary to wishes for fairly conservative care as well as patient agitation with blood draws-- whenOcie  will allow will  attempt to update this  #4- anemia most likely secondary to chronic disease- this is stable as well with a hemoglobin of 12 last December and will update this when [facility] allow  #5-history of syncope again but this has not occurred now for some time as noted above we have not been aggressive pursuing workup secondary to family wishes for conservative care.  6.  History of femur fracture in the past she is on calcium with vitamin D pain does not appear to be an issue she largely ambulates in a wheelchair--with staff assistance.  [id number]

## 2017-11-06 ENCOUNTER — Encounter (HOSPITAL_COMMUNITY)
Admission: RE | Admit: 2017-11-06 | Discharge: 2017-11-06 | Disposition: A | Discharge status: Home / Self Care | Payer: Medicare Other | Source: Skilled Nursing Facility | Attending: Internal Medicine | Admitting: Internal Medicine

## 2017-11-06 DIAGNOSIS — D649 Anemia, unspecified: Secondary | ICD-10-CM | POA: Diagnosis not present

## 2017-11-06 LAB — CBC WITH DIFFERENTIAL/PLATELET
BASOS ABS: 0 10*3/uL (ref 0.0–0.1)
BASOS PCT: 1 %
EOS ABS: 0.3 10*3/uL (ref 0.0–0.7)
EOS PCT: 5 %
HCT: 35.3 % — ABNORMAL LOW (ref 36.0–46.0)
Hemoglobin: 10.7 g/dL — ABNORMAL LOW (ref 12.0–15.0)
LYMPHS PCT: 35 %
Lymphs Abs: 2.3 10*3/uL (ref 0.7–4.0)
MCH: 27.5 pg (ref 26.0–34.0)
MCHC: 30.3 g/dL (ref 30.0–36.0)
MCV: 90.7 fL (ref 78.0–100.0)
Monocytes Absolute: 0.5 10*3/uL (ref 0.1–1.0)
Monocytes Relative: 7 %
Neutro Abs: 3.5 10*3/uL (ref 1.7–7.7)
Neutrophils Relative %: 52 %
PLATELETS: 243 10*3/uL (ref 150–400)
RBC: 3.89 MIL/uL (ref 3.87–5.11)
RDW: 13.5 % (ref 11.5–15.5)
WBC: 6.6 10*3/uL (ref 4.0–10.5)

## 2017-11-06 LAB — BASIC METABOLIC PANEL
Anion gap: 12 (ref 5–15)
BUN: 45 mg/dL — ABNORMAL HIGH (ref 6–20)
CALCIUM: 8.7 mg/dL — AB (ref 8.9–10.3)
CO2: 22 mmol/L (ref 22–32)
Chloride: 107 mmol/L (ref 101–111)
Creatinine, Ser: 1.93 mg/dL — ABNORMAL HIGH (ref 0.44–1.00)
GFR, EST AFRICAN AMERICAN: 25 mL/min — AB (ref >60)
GFR, EST NON AFRICAN AMERICAN: 22 mL/min — AB (ref >60)
Glucose, Bld: 89 mg/dL (ref 65–99)
POTASSIUM: 3.7 mmol/L (ref 3.5–5.1)
SODIUM: 141 mmol/L (ref 135–145)

## 2017-11-12 ENCOUNTER — Non-Acute Institutional Stay (SKILLED_NURSING_FACILITY): Payer: Medicare Other | Admitting: Internal Medicine

## 2017-11-12 ENCOUNTER — Encounter: Payer: Self-pay | Admitting: Internal Medicine

## 2017-11-12 DIAGNOSIS — F0391 Unspecified dementia with behavioral disturbance: Secondary | ICD-10-CM | POA: Diagnosis not present

## 2017-11-12 DIAGNOSIS — R05 Cough: Secondary | ICD-10-CM

## 2017-11-12 DIAGNOSIS — R059 Cough, unspecified: Secondary | ICD-10-CM

## 2017-11-12 NOTE — Progress Notes (Signed)
Location:   [facility] Room Number: 106/D Place of Service:  SNF [phone]) Provider:  Freddi Starr, MD  Patient Care Team: Virgie Dad, MD as PCP - General (Internal Medicine) Virgie Dad, MD as Consulting Physician (Geriatric Medicine) Rolm Baptise as Physician Assistant (Internal Medicine)  Extended Emergency Contact Information Primary Emergency Contact: Patria, [place], [place] [address] Johnnette Litter of [place] Phone: [phone] Mobile Phone: [phone] Relation: Daughter Secondary Emergency Contact: Madden, [place], [place] [address] [place] of [place] Phone: [phone] Relation: Son  Code Status:  DNR Goals of care: Advanced Directive information Advanced Directives 11/12/2017  Does Patient Have a Medical Advance Directive? Yes  Type of Advance Directive Out of facility DNR (pink MOST or yellow form)  Does patient want to make changes to medical advance directive? No - Patient declined  Copy of [place] in Chart? No - copy requested  Pre-existing out of facility DNR order (yellow form or pink MOST form) -     Chief complaint-acute visit secondary to cough-  HPI:  [name] seen today for an acute visit for apparently some increased cough at times  --Per nursing-there is some thought possibly she may have some aspiration at times.  Patient herself has significant dementia and cannot give any review of systems she appears to be at her baseline today no signs of labored breathing or overt chest congestion.  She is on a regular no added salt diet  Weight has been stable-she is on a regular no added salt diet  She is sitting in her chair comfortably-O2 saturations are in the 90s on room air.  Patient is a long-term resident of facility with history of dementia with behaviors as well as chronic kidney disease-Anemia of chronic disease and occasional syncopal  episodes which have not occurred in some time.       Past Medical History:  Diagnosis Date  . Alzheimer disease   . Anemia   . Anxiety   . Arthritis   . Hyperlipemia   . Hypertension   . Kidney calculus   . Pneumonia    Past Surgical History:  Procedure Laterality Date  . CHOLECYSTECTOMY    . HIP ARTHROPLASTY Left 11/18/2013   Procedure: ARTHROPLASTY BIPOLAR HIP;  Surgeon: Sanjuana Kava, MD;  Location: [facility];  Service: Orthopedics;  Laterality: Left;    Allergies  Allergen Reactions  . Sulfa Antibiotics Rash    Outpatient Encounter Medications as of 11/12/2017  Medication Sig  . aspirin EC 81 MG tablet Take 81 mg by mouth daily.  . calcium-vitamin D (OSCAL WITH D) 500-200 MG-UNIT tablet Take 1 tablet by mouth 2 (two) times daily.  Marland Kitchen Dextromethorphan-Guaifenesin (ROBITUSSIN COUGH+CHEST CONG DM) 5-100 MG/5ML LIQD Take 10 mLs by mouth every 6 (six) hours as needed.   . risperiDONE (RISPERDAL) 0.25 MG tablet Take 0.25 mg by mouth daily.  . risperiDONE (RISPERDAL) 1 MG/ML oral solution Take 0.5 mg by mouth at bedtime.    No facility-administered encounter medications on file as of 11/12/2017.     Review of Systems is unobtainable secondary to dementia please see HPI     Immunization History  Administered Date(s) Administered  . Influenza-Unspecified 06/08/2014, 06/03/2016, 06/05/2017  . Pneumococcal Conjugate-13 10/30/2015  . Pneumococcal-Unspecified 06/10/2016  . Tdap 05/22/2017   Pertinent  Health Maintenance  Due  Topic Date Due  . INFLUENZA VACCINE  Completed  . DEXA SCAN  Completed  . PNA vac Low Risk Adult  Completed   Fall Risk  05/21/2017  Falls in the past year? Yes  Number falls in past yr: 1  Injury with Fall? Yes   Functional Status Survey:    Vitals:   11/12/17 1218  BP: 128/70  Pulse: 67  Resp: 18  Temp: 98.4 F (36.9 C)  TempSrc: Oral  SpO2: 97%  --Weight is 189.6 pounds   Physical Exam   General this is a well-nourished elderly  female no distress.  Her skin is warm and dry.  Eyes he has prescription lenses of sclera and conjunctive are clear visual acuity appears to be intact.  Oropharynx is clear mucous membranes moist.  Chest she does not follow verbal commands but I could not really appreciate any labored breathing or congestion this appears to be baseline.  Heart is regular rate and rhythm without murmur gallop or rub she has chronic venous stasis changes that are mild.  Her abdomen is soft nontender somewhat obese with positive bowel sounds.  Musculoskeletal moves her extremities at baseline does not ambulate except in a wheelchair with lower extremity weakness venous stasis changes.  Neurologic is grossly intact her speech is nonsensical which is baseline I could not really appreciate lateralizing findings.  Cranial nerves appear to be intact.  Psych has significant dementia does not follow verbal commands   Labs reviewed: Recent Labs    05/27/17 0955 08/27/17 0900 11/06/17 0700  NA 138 141 141  K 3.9 4.2 3.7  CL 103 106 107  CO2 24 24 22   GLUCOSE 142* 104* 89  BUN 41* 36* 45*  CREATININE 1.91* 1.81* 1.93*  CALCIUM 8.8* 9.1 8.7*   Recent Labs    02/05/17 0738 05/27/17 0955 08/27/17 0900  AST 11* 14* 15  ALT 10* 13* 31  ALKPHOS 72 74 85  BILITOT 0.5 0.4 0.4  PROT 6.5 6.8 7.4  ALBUMIN 3.1* 3.1* 3.5   Recent Labs    05/27/17 0955 08/27/17 0900 11/06/17 0700  WBC 6.9 9.7 6.6  NEUTROABS 4.3 5.9 3.5  HGB 10.7* 12.0 10.7*  HCT 34.9* 39.0 35.3*  MCV 88.8 89.7 90.7  PLT 212 320 243   Lab Results  Component Value Date   TSH 1.708 12/08/2016   Lab Results  Component Value Date   HGBA1C 5.2 07/14/2016   Lab Results  Component Value Date   CHOL 166 12/27/2015   HDL 44 12/27/2015   LDLCALC 114 (H) 12/27/2015   TRIG 39 12/27/2015   CHOLHDL 3.8 12/27/2015    Significant Diagnostic Results in last 30 days:  No results found.  Assessment/Plan  #1 cough-apparently this is  somewhat chronic at times-she does have as needed Robitussin would continue this and encourage if needed- respiratory exam is quite benign today but will obtain a chest x-ray to rule out any aspect of aspiration-but clinically she appears to be at baseline and doing well-this will continue to be monitored.  [id number]

## 2017-11-13 DIAGNOSIS — F411 Generalized anxiety disorder: Secondary | ICD-10-CM | POA: Diagnosis not present

## 2017-11-13 DIAGNOSIS — F0391 Unspecified dementia with behavioral disturbance: Secondary | ICD-10-CM | POA: Diagnosis not present

## 2017-12-01 ENCOUNTER — Non-Acute Institutional Stay (SKILLED_NURSING_FACILITY): Payer: Medicare Other | Admitting: Internal Medicine

## 2017-12-01 ENCOUNTER — Encounter: Payer: Self-pay | Admitting: Internal Medicine

## 2017-12-01 DIAGNOSIS — R55 Syncope and collapse: Secondary | ICD-10-CM

## 2017-12-01 DIAGNOSIS — D631 Anemia in chronic kidney disease: Secondary | ICD-10-CM

## 2017-12-01 DIAGNOSIS — F0391 Unspecified dementia with behavioral disturbance: Secondary | ICD-10-CM | POA: Diagnosis not present

## 2017-12-01 DIAGNOSIS — N184 Chronic kidney disease, stage 4 (severe): Secondary | ICD-10-CM

## 2017-12-01 DIAGNOSIS — I10 Essential (primary) hypertension: Secondary | ICD-10-CM

## 2017-12-01 NOTE — Progress Notes (Signed)
Location:   [facility] Room Number: 106/D Place of Service:  SNF (31) Provider:  Freddi Starr, MD  Patient Care Team: Virgie Dad, MD as PCP - General (Internal Medicine) Virgie Dad, MD as Consulting Physician (Geriatric Medicine) Rolm Baptise as Physician Assistant (Internal Medicine)  Extended Emergency Contact Information Primary Emergency Contact: Shant[place], [place], [place] [address] Johnnette Litter of [place] Phone: [phone] Mobile Phone: [phone] Relation: Daughter Secondary Emergency Contact: Kiyana, [place],  [address] [place] of [place] Phone: [phone] Relation: Son  Code Status:  DNR Goals of care: Advanced Directive information Advanced Directives 12/01/2017  Does Patient Have a Medical Advance Directive? Yes  Type of Advance Directive Out of facility DNR (pink MOST or yellow form)  Does patient want to make changes to medical advance directive? No - Patient declined  Copy of [place] in Chart? No - copy requested  Pre-existing out of facility DNR order (yellow form or pink MOST form) -     Chief Complaint  Patient presents with  . Medical Management of Chronic Issues    Routine Visit  For medical management of chronic medical conditions including dementia as well as hypertension and chronic kidney disease as well as anemia and history of femur fracture as well as history of syncope.    HPI:  [name] seen today for medical management of chronic diseases.  As noted above.  Clinically she appears to be at baseline her weight is stable she appears to be relatively well her daughter is actually feeding her at noon today her daughter is very supportive--and she appears to be eating well.  I did see her recently for a slight cough chest x-ray did not really show any acute process and this appears to have resolved occasionally she will have  a cough but chest x-ray did not show aspiration.  She does have a history of significant end-stage dementia but has been  stable.  Behaviors were a challenging issue at one time but this has stabilized with her Risperdal twice a day-her Xanax was discontinued because of peer to be overly sedating.  Regards to hypertension this appears stable blood pressure is 110/78 today-she will have occasional spikes see one  systolic listed in the 824M but this does not appear to be consistent no suspect at times spikes are more agitation related  She also has a history of chronic kidney disease stage IV which has been stable-lab done approximately a month ago shows stability with a creatinine of 1.93 BUN of 45.  She also has a history of anemia most likely chronic disease baseline hemoglobins have ranged from 10-12 this appears to be stable as well on recent lab with a hemoglobin of 10.7.   She at one point did have some recurrent syncopal episodes which would occur occasionally- she usually quickly returned back to her baseline after a short episode of unresponsiveness.  Family does not desire aggressive workup of this because of her advanced dementia and age-actually she has not had 1 of these in quite a while.  Currently she is sitting in her chair she has just finished eating- her daughter was here feeding her which she does frequently  .  Nursing does not report any recent acute issues        Past Medical History:  Diagnosis Date  . Alzheimer disease   . Anemia   . Anxiety   . Arthritis   . Hyperlipemia   . Hypertension   . Kidney calculus   . Pneumonia    Past Surgical History:  Procedure Laterality Date  . CHOLECYSTECTOMY    . HIP ARTHROPLASTY Left 11/18/2013   Procedure: ARTHROPLASTY BIPOLAR HIP;  Surgeon: Sanjuana Kava, MD;  Location: [facility];  Service: Orthopedics;  Laterality: Left;    Allergies  Allergen Reactions  . Sulfa Antibiotics Rash    Outpatient Encounter  Medications as of 12/01/2017  Medication Sig  . aspirin EC 81 MG tablet Take 81 mg by mouth daily.  . calcium-vitamin D (OSCAL WITH D) 500-200 MG-UNIT tablet Take 1 tablet by mouth 2 (two) times daily.  Marland Kitchen Dextromethorphan-Guaifenesin (ROBITUSSIN COUGH+CHEST CONG DM) 5-100 MG/5ML LIQD Take 10 mLs by mouth every 6 (six) hours as needed.   . risperiDONE (RISPERDAL) 0.25 MG tablet Take 0.25 mg by mouth daily.  . risperiDONE (RISPERDAL) 1 MG/ML oral solution Take 0.5 mg by mouth at bedtime.    No facility-administered encounter medications on file as of 12/01/2017.      Review of Systems   Unobtainable secondary to dementia please see HPI  Immunization History  Administered Date(s) Administered  . Influenza-Unspecified 06/08/2014, 06/03/2016, 06/05/2017  . Pneumococcal Conjugate-13 10/30/2015  . Pneumococcal-Unspecified 06/10/2016  . Tdap 05/22/2017   Pertinent  Health Maintenance Due  Topic Date Due  . INFLUENZA VACCINE  04/01/2018  . DEXA SCAN  Completed  . PNA vac Low Risk Adult  Completed   Fall Risk  05/21/2017  Falls in the past year? Yes  Number falls in past yr: 1  Injury with Fall? Yes   Functional Status Survey:    Vitals:   12/01/17 1203  BP: 110/78  Pulse: 80  Resp: 20  Temp: 98 F (36.7 C)  TempSrc: Oral  SpO2: 99%  Weight: 187 lb 9.6 oz (85.1 kg)  Height: 5\' 1"  (1.549 m)   Body mass index is 35.45 kg/m. Physical Exam   In general this is a well-nourished elderly female in no distress sitting comfortably in a wheelchair.  Her skin is warm and dry.  Eyes sclera and conjunctive are clear visual acuity appears grossly intact she has prescription lenses.  Oropharynx is clear mucous membranes moist.  Chest is clear to auscultation she does not follow verbal commands but I cannot appreciate any labored breathing.  Heart is regular rate and rhythm without murmur gallop or rub she has chronic venous stasis changes edema lower extremities at baseline.  Her  abdomen is soft nontender obese with positive bowel sounds.  Musculoskeletal continues with lower extremity weakness at baseline moves her other extremities at baseline.  Neurologic is grossly intact speech continues to be nonsensical he is not speaking much today- could not really appreciate any lateralizing findings cranial nerves appear to be intact.  Psych findings consistent with significant dementia is not following verbal commands  Labs reviewed: Recent Labs    05/27/17 0955 08/27/17 0900 11/06/17 0700  NA 138 141 141  K 3.9 4.2 3.7  CL 103 106 107  CO2 24 24 22   GLUCOSE 142* 104* 89  BUN 41* 36* 45*  CREATININE 1.91* 1.81* 1.93*  CALCIUM 8.8* 9.1 8.7*   Recent Labs    02/05/17 0738 05/27/17 0955 08/27/17 0900  AST 11* 14* 15  ALT 10* 13* 31  ALKPHOS 72 74 85  BILITOT 0.5 0.4 0.4  PROT 6.5 6.8 7.4  ALBUMIN 3.1* 3.1* 3.5   Recent Labs    05/27/17 0955 08/27/17 0900 11/06/17 0700  WBC 6.9 9.7 6.6  NEUTROABS 4.3 5.9 3.5  HGB 10.7* 12.0 10.7*  HCT 34.9* 39.0 35.3*  MCV 88.8 89.7 90.7  PLT 212 320 243   Lab Results  Component Value Date   TSH 1.708 12/08/2016   Lab Results  Component Value Date   HGBA1C 5.2 07/14/2016   Lab Results  Component Value Date   CHOL 166 12/27/2015   HDL 44 12/27/2015   LDLCALC 114 (H) 12/27/2015   TRIG 39 12/27/2015   CHOLHDL 3.8 12/27/2015    Significant Diagnostic Results in last 30 days:  No results found.  Assessment/Plan  : #1 history of dementia with behaviors-this appears stable on Risperdal twice daily-appetite appears to be good her weight is stable.  We have been monitoring CBGs because of her Risperdal these are stable appears to be largely in the 80s-90s.  2.  Hypertension this appears stable she is not on any medication.  3.  History of chronic kidney disease this actually shows stability with a creatinine-of 1.93 BUN of 45 on lab done last month.  4.  Anemia again this appears stable as well  suspect there is an element of chronic disease hemoglobin last month was 10.7.  5.  History of syncopal episodes as noted above family does not really want aggressive workup-nonetheless this has been stable now for some time with no reoccurrence when it does occur it usually is a very short duration she quickly returns to baseline  #6 history of femur fracture-she is on calcium with vitamin D she does not appear to be having pain- this appears to be stable she is largely ambulatory only in wheelchair.  7.  History of cough-she will have this intermittently she does have an order for Robitussin as needed chest x-ray did not show any acute process   [phone]

## 2017-12-03 DIAGNOSIS — R262 Difficulty in walking, not elsewhere classified: Secondary | ICD-10-CM | POA: Diagnosis not present

## 2017-12-03 DIAGNOSIS — B351 Tinea unguium: Secondary | ICD-10-CM | POA: Diagnosis not present

## 2017-12-03 DIAGNOSIS — L603 Nail dystrophy: Secondary | ICD-10-CM | POA: Diagnosis not present

## 2017-12-03 DIAGNOSIS — I739 Peripheral vascular disease, unspecified: Secondary | ICD-10-CM | POA: Diagnosis not present

## 2017-12-31 ENCOUNTER — Encounter: Payer: Self-pay | Admitting: Internal Medicine

## 2017-12-31 ENCOUNTER — Non-Acute Institutional Stay (SKILLED_NURSING_FACILITY): Payer: Medicare Other | Admitting: Internal Medicine

## 2017-12-31 DIAGNOSIS — D631 Anemia in chronic kidney disease: Secondary | ICD-10-CM

## 2017-12-31 DIAGNOSIS — I10 Essential (primary) hypertension: Secondary | ICD-10-CM

## 2017-12-31 DIAGNOSIS — F0391 Unspecified dementia with behavioral disturbance: Secondary | ICD-10-CM

## 2017-12-31 DIAGNOSIS — R55 Syncope and collapse: Secondary | ICD-10-CM

## 2017-12-31 DIAGNOSIS — N184 Chronic kidney disease, stage 4 (severe): Secondary | ICD-10-CM | POA: Diagnosis not present

## 2017-12-31 NOTE — Progress Notes (Signed)
Location:   [facility] Room Number: 106/D Place of Service:  SNF (31) Provider:  Freddi Starr, MD  Patient Care Team: Virgie Dad, MD as PCP - General (Internal Medicine) Virgie Dad, MD as Consulting Physician (Geriatric Medicine) Rolm Baptise as Physician Assistant (Internal Medicine)  Extended Emergency Contact Information Primary Emergency Contact: Lithzy, [place], [place] [address] Johnnette Litter of [place] Phone: [phone] Mobile Phone: [phone] Relation: Daughter Secondary Emergency Contact: Mykal, [place], [place] [address] [place] of [place] Phone: [phone] Relation: Son  Code Status:  DNR Goals of care: Advanced Directive information Advanced Directives 12/31/2017  Does Patient Have a Medical Advance Directive? Yes  Type of Advance Directive Out of facility DNR (pink MOST or yellow form)  Does patient want to make changes to medical advance directive? No - Patient declined  Copy of [place] in Chart? No - copy requested  Pre-existing out of facility DNR order (yellow form or pink MOST form) -     Chief Complaint  Patient presents with  . Medical Management of Chronic Issues    Routine Visit   For medical management of chronic medical issues including dementia with behaviors-hypertension-chronic kidney disease- anemia.--History of femur fracture   HPI:  [name] seen today for medical management of chronic diseases--as noted above.  She continues to be quite stable.  She does have a history of severe dementia with behaviors but this appears stable on the twice daily Risperdal-at one point was on Xanax but did not tolerate this well and appeared to be  somewhat oversedated.  Her weight continues to be stable as well.  She does have a history of chronic kidney disease baseline creatinine appears to be around one-point 9-2 range it was most  recently 1.93 with BUN of 45 on lab done in March.  He also has associated anemia of chronic disease which appears stable with a hemoglobin of 10.7 in March.  At one point she was having syncopal episodes occasionally where she would become unresponsive and then fairly quickly returned back to her baseline-family did not want aggressive work-up of this-nonetheless this has not occurred for some time now.    Currently she is resting in bed comfortably does allow a limited exam at times she will have agitation with some basic maneuvers.  She continues to have a strong family support her daughter is in here quite a bit helping to feed her attending to her needs   Past Medical History:  Diagnosis Date  . Alzheimer disease   . Anemia   . Anxiety   . Arthritis   . Hyperlipemia   . Hypertension   . Kidney calculus   . Pneumonia    Past Surgical History:  Procedure Laterality Date  . CHOLECYSTECTOMY    . HIP ARTHROPLASTY Left 11/18/2013   Procedure: ARTHROPLASTY BIPOLAR HIP;  Surgeon: Sanjuana Kava, MD;  Location: [facility];  Service: Orthopedics;  Laterality: Left;    Allergies  Allergen Reactions  . Sulfa Antibiotics Rash    Outpatient Encounter Medications as of 12/31/2017  Medication Sig  . aspirin EC 81 MG tablet Take 81 mg by mouth daily.  . calcium-vitamin D (OSCAL WITH D) 500-200 MG-UNIT tablet Take 1 tablet by mouth 2 (two) times daily.  Marland Kitchen Dextromethorphan-Guaifenesin (ROBITUSSIN COUGH+CHEST CONG DM) 5-100 MG/5ML  LIQD Take 10 mLs by mouth every 6 (six) hours as needed.   . risperiDONE (RISPERDAL) 0.25 MG tablet Take 0.25 mg by mouth daily.  . risperiDONE (RISPERDAL) 1 MG/ML oral solution Take 0.5 mg by mouth at bedtime.    No facility-administered encounter medications on file as of 12/31/2017.      Review of Systems   Unobtainable secondary to dementia  Immunization History  Administered Date(s) Administered  . Influenza-Unspecified 06/08/2014, 06/03/2016, 06/05/2017  .  Pneumococcal Conjugate-13 10/30/2015  . Pneumococcal-Unspecified 06/10/2016  . Tdap 05/22/2017   Pertinent  Health Maintenance Due  Topic Date Due  . INFLUENZA VACCINE  04/01/2018  . DEXA SCAN  Completed  . PNA vac Low Risk Adult  Completed   Fall Risk  05/21/2017  Falls in the past year? Yes  Number falls in past yr: 1  Injury with Fall? Yes   Functional Status Survey:    Vitals:   12/31/17 1609  BP: 124/62  Pulse: 76  Resp: 20  Temp: (!) 97.2 F (36.2 C)  TempSrc: Axillary  SpO2: 99%  Weight: 187 lb (84.8 kg)  Height: 5\' 1"  (1.549 m)   Body mass index is 35.33 kg/m. Physical Exam  In general this is a well-nourished elderly female in no distress lying comfortably in bed.  Her skin is warm and dry--she has numerous seborrheic keratosis.  Eyes sclera conjunctive are clear visual acuity appears grossly intact she does make eye contact   Oropharynx is clear mucous membranes moist.  Chest is clear to auscultation with poor respiratory effort she did really follow verbal commands there is no labored breathing.  Heart is regular rate and rhythm without murmur gallop or rub she has chronic venous stasis edema which appears baseline.  Her abdomen is soft nontender obese with positive bowel sounds.  Musculoskeletal does have lower extremity weakness limited exam since she is in bed but appears able to move all extremities at baseline upper extremity strength appears to be quite strong bilaterally.  Neurologic is grossly intact without lateralizing findings her speech often is nonsensical but clear  Psych findings consistent again with significant dementia she did allow a limited exam.    Labs reviewed: Recent Labs    05/27/17 0955 08/27/17 0900 11/06/17 0700  NA 138 141 141  K 3.9 4.2 3.7  CL 103 106 107  CO2 24 24 22   GLUCOSE 142* 104* 89  BUN 41* 36* 45*  CREATININE 1.91* 1.81* 1.93*  CALCIUM 8.8* 9.1 8.7*   Recent Labs    02/05/17 0738 05/27/17 0955  08/27/17 0900  AST 11* 14* 15  ALT 10* 13* 31  ALKPHOS 72 74 85  BILITOT 0.5 0.4 0.4  PROT 6.5 6.8 7.4  ALBUMIN 3.1* 3.1* 3.5   Recent Labs    05/27/17 0955 08/27/17 0900 11/06/17 0700  WBC 6.9 9.7 6.6  NEUTROABS 4.3 5.9 3.5  HGB 10.7* 12.0 10.7*  HCT 34.9* 39.0 35.3*  MCV 88.8 89.7 90.7  PLT 212 320 243   Lab Results  Component Value Date   TSH 1.708 12/08/2016   Lab Results  Component Value Date   HGBA1C 5.2 07/14/2016   Lab Results  Component Value Date   CHOL 166 12/27/2015   HDL 44 12/27/2015   LDLCALC 114 (H) 12/27/2015   TRIG 39 12/27/2015   CHOLHDL 3.8 12/27/2015    Significant Diagnostic Results in last 30 days:  No results found.  Assessment/Plan  #1 history of dementia with behaviors this  appears stable on the Risperdal-behaviors appear to be well controlled at one point this was a significant issue but Risperdal appears to have been very effective  We have been taking blood sugar secondary to high risk meds and these have been stable in the 80s to 90s most recently.  2.  Hypertension this appears stable at times is difficult to get her blood pressure because of agitation but I do not see consistent elevations she is not on any medication.  3.  History of chronic kidney disease this appears to be stable will monitor periodically last BUN was 45 creatinine 1.93 which ihasbeen relatively baseline with previous values for an extended period of time.  4.  History of anemia again this appears stable as well most likely due to chronic renal disease hemoglobin was 10.7 in March will monitor periodically.  5.  History of femur fracture in the past she is on calcium and vitamin D she is ambulatory only in a wheelchair.  6.  History of syncopal episodes again this appears to have moderated recently- family does not want aggressive work-up again when this does occur she usually quickly returned back to her baseline.  [id number]

## 2018-01-15 DIAGNOSIS — F411 Generalized anxiety disorder: Secondary | ICD-10-CM | POA: Diagnosis not present

## 2018-01-15 DIAGNOSIS — F0391 Unspecified dementia with behavioral disturbance: Secondary | ICD-10-CM | POA: Diagnosis not present

## 2018-01-27 ENCOUNTER — Encounter: Payer: Self-pay | Admitting: Internal Medicine

## 2018-01-27 NOTE — Progress Notes (Deleted)
Location:   [facility] Room Number: 106/D Place of Service:  SNF (31) Provider:  Freddi Starr, MD  Patient Care Team: Virgie Dad, MD as PCP - General (Internal Medicine) Virgie Dad, MD as Consulting Physician (Geriatric Medicine) Rolm Baptise as Physician Assistant (Internal Medicine)  Extended Emergency Contact Information Primary Emergency Contact: Alishea, [place], [place] [address] Johnnette Litter of [place] Phone: [phone] Mobile Phone: [phone] Relation: Daughter Secondary Emergency Contact: Maybelle, [place],  [address] [place] of [place] Phone: [phone] Relation: Son  Code Status:  DNR Goals of care: Advanced Directive information Advanced Directives 01/27/2018  Does Patient Have a Medical Advance Directive? Yes  Type of Advance Directive Out of facility DNR (pink MOST or yellow form)  Does patient want to make changes to medical advance directive? No - Patient declined  Copy of [place] in Chart? No - copy requested  Pre-existing out of facility DNR order (yellow form or pink MOST form) -     Chief Complaint  Patient presents with  . Medical Management of Chronic Issues    Patient being seen for Routine Visit of Medical Management    HPI:  [name] seen today for medical management of chronic diseases.     Past Medical History:  Diagnosis Date  . Alzheimer disease   . Anemia   . Anxiety   . Arthritis   . Hyperlipemia   . Hypertension   . Kidney calculus   . Pneumonia    Past Surgical History:  Procedure Laterality Date  . CHOLECYSTECTOMY    . HIP ARTHROPLASTY Left 11/18/2013   Procedure: ARTHROPLASTY BIPOLAR HIP;  Surgeon: Sanjuana Kava, MD;  Location: [facility];  Service: Orthopedics;  Laterality: Left;    Allergies  Allergen Reactions  . Sulfa Antibiotics Rash    Outpatient Encounter Medications as of 01/27/2018    Medication Sig  . aspirin EC 81 MG tablet Take 81 mg by mouth daily.  . calcium-vitamin D (OSCAL WITH D) 500-200 MG-UNIT tablet Take 1 tablet by mouth 2 (two) times daily.  Marland Kitchen Dextromethorphan-Guaifenesin (ROBITUSSIN COUGH+CHEST CONG DM) 5-100 MG/5ML LIQD Take 10 mLs by mouth every 6 (six) hours as needed.   . risperiDONE (RISPERDAL) 0.25 MG tablet Take 0.25 mg by mouth daily.  . risperiDONE (RISPERDAL) 1 MG/ML oral solution Take 0.5 mg by mouth at bedtime.    No facility-administered encounter medications on file as of 01/27/2018.      Review of Systems  Immunization History  Administered Date(s) Administered  . Influenza-Unspecified 06/08/2014, 06/03/2016, 06/05/2017  . Pneumococcal Conjugate-13 10/30/2015  . Pneumococcal-Unspecified 06/10/2016  . Tdap 05/22/2017   Pertinent  Health Maintenance Due  Topic Date Due  . INFLUENZA VACCINE  04/01/2018  . DEXA SCAN  Completed  . PNA vac Low Risk Adult  Completed   Fall Risk  05/21/2017  Falls in the past year? Yes  Number falls in past yr: 1  Injury with Fall? Yes   Functional Status Survey:    Vitals:   01/27/18 1351  BP: 124/68  Pulse: 74  Resp: 20  SpO2: 99%  Weight: 187 lb (84.8 kg)  Height: 5\' 1"  (1.549 m)   Body mass index is 35.33 kg/m. Physical Exam  Labs reviewed: Recent Labs    05/27/17 0955 08/27/17 0900 11/06/17 0700  NA 138 141 141  K 3.9 4.2 3.7  CL 103 106 107  CO2 24 24 22   GLUCOSE 142* 104* 89  BUN 41* 36* 45*  CREATININE 1.91* 1.81* 1.93*  CALCIUM 8.8* 9.1 8.7*   Recent Labs    02/05/17 0738 05/27/17 0955 08/27/17 0900  AST 11* 14* 15  ALT 10* 13* 31  ALKPHOS 72 74 85  BILITOT 0.5 0.4 0.4  PROT 6.5 6.8 7.4  ALBUMIN 3.1* 3.1* 3.5   Recent Labs    05/27/17 0955 08/27/17 0900 11/06/17 0700  WBC 6.9 9.7 6.6  NEUTROABS 4.3 5.9 3.5  HGB 10.7* 12.0 10.7*  HCT 34.9* 39.0 35.3*  MCV 88.8 89.7 90.7  PLT 212 320 243   Lab Results  Component Value Date   TSH 1.708 12/08/2016    Lab Results  Component Value Date   HGBA1C 5.2 07/14/2016   Lab Results  Component Value Date   CHOL 166 12/27/2015   HDL 44 12/27/2015   LDLCALC 114 (H) 12/27/2015   TRIG 39 12/27/2015   CHOLHDL 3.8 12/27/2015    Significant Diagnostic Results in last 30 days:  No results found.  Assessment/Plan There are no diagnoses linked to this encounter.   Family/ staff Communication:   Labs/tests ordered:

## 2018-01-28 ENCOUNTER — Encounter: Payer: Self-pay | Admitting: Internal Medicine

## 2018-01-28 ENCOUNTER — Non-Acute Institutional Stay (SKILLED_NURSING_FACILITY): Payer: Medicare Other | Admitting: Internal Medicine

## 2018-01-28 DIAGNOSIS — R55 Syncope and collapse: Secondary | ICD-10-CM

## 2018-01-28 DIAGNOSIS — I10 Essential (primary) hypertension: Secondary | ICD-10-CM | POA: Diagnosis not present

## 2018-01-28 DIAGNOSIS — N184 Chronic kidney disease, stage 4 (severe): Secondary | ICD-10-CM

## 2018-01-28 DIAGNOSIS — D631 Anemia in chronic kidney disease: Secondary | ICD-10-CM

## 2018-01-28 DIAGNOSIS — F0391 Unspecified dementia with behavioral disturbance: Secondary | ICD-10-CM | POA: Diagnosis not present

## 2018-01-28 NOTE — Progress Notes (Signed)
This encounter was created in error - please disregard.

## 2018-01-28 NOTE — Progress Notes (Signed)
Location:   [facility] Room Number: 106/D Place of Service:  SNF (31) Provider:  Freddi Starr, MD  Patient Care Team: Virgie Dad, MD as PCP - General (Internal Medicine) Virgie Dad, MD as Consulting Physician (Geriatric Medicine) Rolm Baptise as Physician Assistant (Internal Medicine)  Extended Emergency Contact Information Primary Emergency Contact: Shonte, [place], [place] [address] Johnnette Litter of [place] Phone: [phone] Mobile Phone: [phone] Relation: Daughter Secondary Emergency Contact: Retal, [place],  [address] [place] of [place] Phone: [phone] Relation: Son  Code Status:  DNR Goals of care: Advanced Directive information Advanced Directives 01/28/2018  Does Patient Have a Medical Advance Directive? Yes  Type of Advance Directive Out of facility DNR (pink MOST or yellow form)  Does patient want to make changes to medical advance directive? No - Patient declined  Copy of [place] in Chart? No - copy requested  Pre-existing out of facility DNR order (yellow form or pink MOST form) -     Chief Complaint  Patient presents with  . Medical Management of Chronic Issues    Patient being seen for Routine Visit of Medical Management  For medical management of chronic medical conditions including dementia-hypertension- chronic kidney disease-anemia of chronic disease-history of femur fracture as well as history of syncope in the past  HPI:  [name] seen today for medical management of chronic diseases.  As noted above.  She continues to have a period of extended stability she does have significant severe dementia but does well with supportive care her weight has been stable her appetite remains good-she does have strong support from her daughter who visits daily and helps with feeding  She is on Risperdal twice daily and has been followed  by psych services this has helped she had been on Xanax but this was discontinued because of sedation concerns  Regard to chronic kidney disease this has been relatively stable with a creatinine of 1.93 back in March we will update this she does drink fairly well.  She also has a history of associated anemia of chronic disease which shows relative stability at 10.7 and will update this as well.  She does have a history of hypertension is not on any meds but this appears stable blood pressure today was 112/80   she also has a history of syncope in the past she will have short unresponsive episodes and then quickly returns to baseline- however this hasnot occurred for a extended period of time family does not desire aggressive work-up of this  She also does have a history of a femur fracture again she is largely nonambulatory and is in a wheelchair-she is on calcium and vitamin D  Currently nursing does not express any concerns   Past Medical History:  Diagnosis Date  . Alzheimer disease   . Anemia   . Anxiety   . Arthritis   . Hyperlipemia   . Hypertension   . Kidney calculus   . Pneumonia    Past Surgical History:  Procedure Laterality Date  . CHOLECYSTECTOMY    . HIP ARTHROPLASTY Left 11/18/2013   Procedure: ARTHROPLASTY BIPOLAR HIP;  Surgeon: Sanjuana Kava, MD;  Location: [facility];  Service: Orthopedics;  Laterality: Left;    Allergies  Allergen Reactions  . Sulfa Antibiotics Rash    Outpatient  Encounter Medications as of 01/28/2018  Medication Sig  . aspirin EC 81 MG tablet Take 81 mg by mouth daily.  . calcium-vitamin D (OSCAL WITH D) 500-200 MG-UNIT tablet Take 1 tablet by mouth 2 (two) times daily.  Marland Kitchen Dextromethorphan-Guaifenesin (ROBITUSSIN COUGH+CHEST CONG DM) 5-100 MG/5ML LIQD Take 10 mLs by mouth every 6 (six) hours as needed.   . risperiDONE (RISPERDAL) 0.25 MG tablet Take 0.25 mg by mouth daily.  . risperiDONE (RISPERDAL) 1 MG/ML oral solution Take 0.5 mg by mouth  at bedtime.    No facility-administered encounter medications on file as of 01/28/2018.      Review of Systems   Is unobtainable secondary to dementia  Immunization History  Administered Date(s) Administered  . Influenza-Unspecified 06/08/2014, 06/03/2016, 06/05/2017  . Pneumococcal Conjugate-13 10/30/2015  . Pneumococcal-Unspecified 06/10/2016  . Tdap 05/22/2017   Pertinent  Health Maintenance Due  Topic Date Due  . INFLUENZA VACCINE  04/01/2018  . DEXA SCAN  Completed  . PNA vac Low Risk Adult  Completed   Fall Risk  05/21/2017  Falls in the past year? Yes  Number falls in past yr: 1  Injury with Fall? Yes   Functional Status Survey:    Vitals:   01/28/18 1244  BP: 124/68  Pulse: 74  Resp: 20  SpO2: 99%  Weight: 187 lb (84.8 kg)  Height: 5\' 1"  (1.549 m)  Body mass index is 35.33 kg/m.Manual blood pressure today was 112/80  Physical Exam    general this is a well-nourished elderly female in no distress she is sitting in a wheelchair   Her skin is warm and dry she has numerous seborrheic keratosis which is baseline    eyes she has prescription lenses sclera and conjunctive appear clear visual acuity appears grossly intact  Oropharynx is clear mucous membranes moist  Chest is clear to auscultation with poor respiratory effort there is no labored breathing she does not really follow verbal commands.  Heart is regular rate and rhythm without murmur gallop or rub continues with chronic venous stasis edema which appears relatively unchanged.  Her abdomen is obese soft nontender with positive bowel sounds.  Musculoskeletal Limited exam since she does not follow verbal commands but appears to be at baseline strength appears to be intact upper extremities has baseline lower extremity weakness I do not note any changes other than arthritic  Neurologic she is alert could not really appreciate lateralizing findings cranial nerves appear to be intact she does speak but  nonsensically.  Psych findings consistent with severe dementia she did allow on exam today does not really follow verbal commands  Labs reviewed: Recent Labs    05/27/17 0955 08/27/17 0900 11/06/17 0700  NA 138 141 141  K 3.9 4.2 3.7  CL 103 106 107  CO2 24 24 22   GLUCOSE 142* 104* 89  BUN 41* 36* 45*  CREATININE 1.91* 1.81* 1.93*  CALCIUM 8.8* 9.1 8.7*   Recent Labs    02/05/17 0738 05/27/17 0955 08/27/17 0900  AST 11* 14* 15  ALT 10* 13* 31  ALKPHOS 72 74 85  BILITOT 0.5 0.4 0.4  PROT 6.5 6.8 7.4  ALBUMIN 3.1* 3.1* 3.5   Recent Labs    05/27/17 0955 08/27/17 0900 11/06/17 0700  WBC 6.9 9.7 6.6  NEUTROABS 4.3 5.9 3.5  HGB 10.7* 12.0 10.7*  HCT 34.9* 39.0 35.3*  MCV 88.8 89.7 90.7  PLT 212 320 243   Lab Results  Component Value Date   TSH 1.708  12/08/2016   Lab Results  Component Value Date   HGBA1C 5.2 07/14/2016   Lab Results  Component Value Date   CHOL 166 12/27/2015   HDL 44 12/27/2015   LDLCALC 114 (H) 12/27/2015   TRIG 39 12/27/2015   CHOLHDL 3.8 12/27/2015    Significant Diagnostic Results in last 30 days:  No results found.  Assessment  1.  Dementia with behaviors this appears to have stabilized now for an extended period of time she is on Risperdal twice a day-no longer on Xanax because of sedation concerns.  CBGs have been in the 70s to low 90s we have been tracking this because she is on the Risperdal    2 hypertension this appears stable on no medications again manual blood pressure today was 112/80.  3-history of chronic kidney disease this appears relatively stable with creatinine 1.93 this lab was done in March will update this for current values.  4.  Anemia of chronic disease this appears stable as well with a hemoglobin of 10.7 in March will update this as well.  5 history of syncopal episodes which will reoccur  but it has been sometime since she has had one-- quickly returns to baseline and family does not desire  aggressive work-up of this   6- History of femur fracture-she continues on calcium with vitamin D-she is largely nonambulatory is in a wheelchair again this is complicated with severe dementia  Again will update a CBC and basic metabolic panel for updated values  [phone]

## 2018-01-29 ENCOUNTER — Encounter (HOSPITAL_COMMUNITY)
Admission: RE | Admit: 2018-01-29 | Discharge: 2018-01-29 | Disposition: A | Discharge status: Home / Self Care | Payer: Medicare Other | Source: Skilled Nursing Facility | Attending: Internal Medicine | Admitting: Internal Medicine

## 2018-01-29 DIAGNOSIS — I129 Hypertensive chronic kidney disease with stage 1 through stage 4 chronic kidney disease, or unspecified chronic kidney disease: Secondary | ICD-10-CM | POA: Diagnosis present

## 2018-01-29 DIAGNOSIS — Z9181 History of falling: Secondary | ICD-10-CM | POA: Diagnosis present

## 2018-01-29 DIAGNOSIS — F29 Unspecified psychosis not due to a substance or known physiological condition: Secondary | ICD-10-CM | POA: Diagnosis present

## 2018-01-29 LAB — CBC WITH DIFFERENTIAL/PLATELET
Basophils Absolute: 0 10*3/uL (ref 0.0–0.1)
Basophils Relative: 0 %
EOS PCT: 4 %
Eosinophils Absolute: 0.3 10*3/uL (ref 0.0–0.7)
HCT: 35.5 % — ABNORMAL LOW (ref 36.0–46.0)
Hemoglobin: 11.1 g/dL — ABNORMAL LOW (ref 12.0–15.0)
LYMPHS ABS: 2.4 10*3/uL (ref 0.7–4.0)
LYMPHS PCT: 32 %
MCH: 27.1 pg (ref 26.0–34.0)
MCHC: 31.3 g/dL (ref 30.0–36.0)
MCV: 86.8 fL (ref 78.0–100.0)
Monocytes Absolute: 0.4 10*3/uL (ref 0.1–1.0)
Monocytes Relative: 6 %
NEUTROS ABS: 4.4 10*3/uL (ref 1.7–7.7)
NEUTROS PCT: 58 %
Platelets: 228 10*3/uL (ref 150–400)
RBC: 4.09 MIL/uL (ref 3.87–5.11)
RDW: 13.7 % (ref 11.5–15.5)
WBC: 7.6 10*3/uL (ref 4.0–10.5)

## 2018-01-29 LAB — BASIC METABOLIC PANEL
Anion gap: 7 (ref 5–15)
BUN: 39 mg/dL — AB (ref 6–20)
CHLORIDE: 110 mmol/L (ref 101–111)
CO2: 23 mmol/L (ref 22–32)
Calcium: 8.9 mg/dL (ref 8.9–10.3)
Creatinine, Ser: 1.83 mg/dL — ABNORMAL HIGH (ref 0.44–1.00)
GFR calc Af Amer: 27 mL/min — ABNORMAL LOW (ref >60)
GFR calc non Af Amer: 23 mL/min — ABNORMAL LOW (ref >60)
GLUCOSE: 96 mg/dL (ref 65–99)
POTASSIUM: 3.7 mmol/L (ref 3.5–5.1)
Sodium: 140 mmol/L (ref 135–145)

## 2018-03-01 DIAGNOSIS — B351 Tinea unguium: Secondary | ICD-10-CM | POA: Diagnosis not present

## 2018-03-01 DIAGNOSIS — I739 Peripheral vascular disease, unspecified: Secondary | ICD-10-CM | POA: Diagnosis not present

## 2018-03-14 DIAGNOSIS — I517 Cardiomegaly: Secondary | ICD-10-CM | POA: Diagnosis not present

## 2018-03-15 ENCOUNTER — Encounter: Payer: Self-pay | Admitting: Internal Medicine

## 2018-03-15 ENCOUNTER — Non-Acute Institutional Stay (SKILLED_NURSING_FACILITY): Payer: Medicare Other | Admitting: Internal Medicine

## 2018-03-15 DIAGNOSIS — R05 Cough: Secondary | ICD-10-CM | POA: Diagnosis not present

## 2018-03-15 DIAGNOSIS — F0391 Unspecified dementia with behavioral disturbance: Secondary | ICD-10-CM

## 2018-03-15 DIAGNOSIS — I10 Essential (primary) hypertension: Secondary | ICD-10-CM

## 2018-03-15 DIAGNOSIS — R059 Cough, unspecified: Secondary | ICD-10-CM

## 2018-03-15 NOTE — Progress Notes (Signed)
Location:   [facility] Room Number: 106/D Place of Service:  SNF [phone]) Provider:  Veleta Miners MD  Virgie Dad, MD  Patient Care Team: Virgie Dad, MD as PCP - General (Internal Medicine) Virgie Dad, MD as Consulting Physician (Geriatric Medicine) Rolm Baptise as Physician Assistant (Internal Medicine)  Extended Emergency Contact Information Primary Emergency Contact: [place], [place], [place] [address] Johnnette Litter of [place] Phone: [phone] Mobile Phone: [phone] Relation: Daughter Secondary Emergency Contact: [place], [place], [place] [address] [place] of [place] Phone: [phone] Relation: Son  Code Status:  DNR Goals of care: Advanced Directive information Advanced Directives 03/15/2018  Does Patient Have a Medical Advance Directive? Yes  Type of Advance Directive Out of facility DNR (pink MOST or yellow form)  Does patient want to make changes to medical advance directive? No - Patient declined  Copy of [place] in Chart? No - copy requested  Pre-existing out of facility DNR order (yellow form or pink MOST form) -     Chief Complaint  Patient presents with  . Medical Management of Chronic Issues    This is a Routine Visit  . Acute Visit    Patient has a Cough     HPI:  [name] seen today for medical management of chronic diseases.   Patienthas h/o Dementia with Behavior problems, Hypertension, CKD Stage 4, Anemia And h/O Femur fracture Patient is a long-term resident of facility.  The nurses wanted her to be seen as she has had some cough recently.  She had an chest x-ray done yesterday which was negative for any acute processes.  Her cough seems to be little better today.  Patient is unable to give me any history due to her dementia.  It seems patient has not had any fever or seems in any acute distress. Her weight is up to 190 pounds.  Her daughter comes  and visits her every day. She has not had any other nursing issues  Past Medical History:  Diagnosis Date  . Alzheimer disease   . Anemia   . Anxiety   . Arthritis   . Hyperlipemia   . Hypertension   . Kidney calculus   . Pneumonia    Past Surgical History:  Procedure Laterality Date  . CHOLECYSTECTOMY    . HIP ARTHROPLASTY Left 11/18/2013   Procedure: ARTHROPLASTY BIPOLAR HIP;  Surgeon: Sanjuana Kava, MD;  Location: [facility];  Service: Orthopedics;  Laterality: Left;    Allergies  Allergen Reactions  . Sulfa Antibiotics Rash    Outpatient Encounter Medications as of 03/15/2018  Medication Sig  . aspirin EC 81 MG tablet Take 81 mg by mouth daily.  . calcium-vitamin D (OSCAL WITH D) 500-200 MG-UNIT tablet Take 1 tablet by mouth 2 (two) times daily.  Marland Kitchen Dextromethorphan-Guaifenesin (ROBITUSSIN COUGH+CHEST CONG DM) 5-100 MG/5ML LIQD Take 10 mLs by mouth every 6 (six) hours as needed.   . risperiDONE (RISPERDAL) 0.25 MG tablet Take 0.25 mg by mouth daily.  . risperiDONE (RISPERDAL) 1 MG/ML oral solution Take 0.5 mg by mouth at bedtime.    No facility-administered encounter medications on file as of 03/15/2018.       Review of Systems  Unable to perform ROS: Dementia    Immunization History  Administered Date(s) Administered  . Influenza-Unspecified 06/08/2014, 06/03/2016, 06/05/2017  .  Pneumococcal Conjugate-13 10/30/2015  . Pneumococcal-Unspecified 06/10/2016  . Tdap 05/22/2017   Pertinent  Health Maintenance Due  Topic Date Due  . INFLUENZA VACCINE  04/01/2018  . DEXA SCAN  Completed  . PNA vac Low Risk Adult  Completed   Fall Risk  05/21/2017  Falls in the past year? Yes  Number falls in past yr: 1  Injury with Fall? Yes   Functional Status Survey:    Vitals:   03/15/18 1438  BP: (!) 156/61  Pulse: 66  Resp: 20  Temp: 98 F (36.7 C)  TempSrc: Oral  SpO2: 96%   There is no height or weight on file to calculate BMI. Physical Exam  Constitutional: She  appears well-developed and well-nourished.  HENT:  Head: Normocephalic.  Mouth/Throat: Oropharynx is clear and moist.  Eyes: Pupils are equal, round, and reactive to light.  Neck: Neck supple.  Cardiovascular: Normal rate.  Murmur heard. Pulmonary/Chest: Effort normal and breath sounds normal. No respiratory distress. She has no wheezes. She has no rales.  Abdominal: Soft. Bowel sounds are normal. She exhibits no distension. There is no tenderness. There is no rebound.  Musculoskeletal: She exhibits edema.  Neurological: She is alert.  Does not follow any commands  Skin: Skin is warm and dry.  Psychiatric: She has a normal mood and affect. Her behavior is normal.    Labs reviewed: Recent Labs    08/27/17 0900 11/06/17 0700 01/29/18 0747  NA 141 141 140  K 4.2 3.7 3.7  CL 106 107 110  CO2 24 22 23   GLUCOSE 104* 89 96  BUN 36* 45* 39*  CREATININE 1.81* 1.93* 1.83*  CALCIUM 9.1 8.7* 8.9   Recent Labs    05/27/17 0955 08/27/17 0900  AST 14* 15  ALT 13* 31  ALKPHOS 74 85  BILITOT 0.4 0.4  PROT 6.8 7.4  ALBUMIN 3.1* 3.5   Recent Labs    08/27/17 0900 11/06/17 0700 01/29/18 0747  WBC 9.7 6.6 7.6  NEUTROABS 5.9 3.5 4.4  HGB 12.0 10.7* 11.1*  HCT 39.0 35.3* 35.5*  MCV 89.7 90.7 86.8  PLT 320 243 228   Lab Results  Component Value Date   TSH 1.708 12/08/2016   Lab Results  Component Value Date   HGBA1C 5.2 07/14/2016   Lab Results  Component Value Date   CHOL 166 12/27/2015   HDL 44 12/27/2015   LDLCALC 114 (H) 12/27/2015   TRIG 39 12/27/2015   CHOLHDL 3.8 12/27/2015    Significant Diagnostic Results in last 30 days:  No results found.  Assessment/Plan Cough Her chest x-ray is negative With no fever and shortness of breath we will continue to monitor Robitussin as  needed Essential hypertension Blood pressure has been mildly elevated in the last few visits She is not on any medicines We will discuss with her daughter  Syncope Patient has a  history of syncope She has not had any new episodes recently Dementia with behavior disturbances Continue to remain stable.  Needs 24-hour supportive care She is on low-dose of Risperdal CKD stage IV Creatinine  stable. Anemia due to  CKD Hemoglobin has been stable Bradycardia Resolved since metoprolol was DC'd    Family/ staff Communication:   Labs/tests ordered:    Total time spent in this patient care encounter was 25_ minutes; greater than 50% of the visit spent counseling patient, reviewing records , Labs and coordinating care for problems addressed at this encounter.

## 2018-03-19 ENCOUNTER — Non-Acute Institutional Stay (SKILLED_NURSING_FACILITY): Payer: Medicare Other | Admitting: Internal Medicine

## 2018-03-19 ENCOUNTER — Encounter: Payer: Self-pay | Admitting: Internal Medicine

## 2018-03-19 DIAGNOSIS — R059 Cough, unspecified: Secondary | ICD-10-CM

## 2018-03-19 DIAGNOSIS — J069 Acute upper respiratory infection, unspecified: Secondary | ICD-10-CM | POA: Diagnosis not present

## 2018-03-19 DIAGNOSIS — R05 Cough: Secondary | ICD-10-CM

## 2018-03-19 NOTE — Progress Notes (Signed)
Location:   [facility] Room Number: 106/D Place of Service:  SNF [phone]) Provider:  Freddi Starr, MD  Patient Care Team: Virgie Dad, MD as PCP - General (Internal Medicine) Virgie Dad, MD as Consulting Physician (Geriatric Medicine) Rolm Baptise as Physician Assistant (Internal Medicine)  Extended Emergency Contact Information Primary Emergency Contact: [place], [place], [place] [address] Johnnette Litter of [place] Phone: [phone] Mobile Phone: [phone] Relation: Daughter Secondary Emergency Contact: Quynh, [place], [place] [address] [place] Phone: [phone] Relation: Son  Code Status:  DNR Goals of care: Advanced Directive information Advanced Directives 03/19/2018  Does Patient Have a Medical Advance Directive? Yes  Type of Advance Directive Out of facility DNR (pink MOST or yellow form)  Does patient want to make changes to medical advance directive? No - Patient declined  Copy of [place] in Chart? No - copy requested  Pre-existing out of facility DNR order (yellow form or pink MOST form) -     Chief Complaint  Patient presents with  . Acute Visit    Patient is being seen for a Cough    HPI:  [name] seen today for an acute visit for follow-up of a cough Patient was seen earlier this week for routine visit-she had a x-ray done because of a cough this weekend- however it was negative-  She has an order for Robitussin every 6 hours as needed.  Cough is nonproductive and continues to persist here the past week  She also has a low-grade elevated temperature at 99.2 which is somewhat unusual for her she is a long-term resident of the facility with chronic kidney disease stage IV as well as anemia history of femur fracture and dementia with behavior problems appears to have stabilized significantly on the Risperdal  Again main issue is the  cough now and possibly a slightly elevated temperature with concerns she may be having some bronchitis.  Her daughter thought she had some mild wheezing yesterday but does not appear to have that today although she does have some chest congestion  Other than mildly elevated temperature t her vital signs stable O2 saturation is in the 90s on room air   Past Medical History:  Diagnosis Date  . Alzheimer disease   . Anemia   . Anxiety   . Arthritis   . Hyperlipemia   . Hypertension   . Kidney calculus   . Pneumonia    Past Surgical History:  Procedure Laterality Date  . CHOLECYSTECTOMY    . HIP ARTHROPLASTY Left 11/18/2013   Procedure: ARTHROPLASTY BIPOLAR HIP;  Surgeon: Sanjuana Kava, MD;  Location: [facility];  Service: Orthopedics;  Laterality: Left;    Allergies  Allergen Reactions  . Sulfa Antibiotics Rash    Outpatient Encounter Medications as of 03/19/2018  Medication Sig  . aspirin EC 81 MG tablet Take 81 mg by mouth daily.  . calcium-vitamin D (OSCAL WITH D) 500-200 MG-UNIT tablet Take 1 tablet by mouth 2 (two) times daily.  Marland Kitchen Dextromethorphan-Guaifenesin (ROBITUSSIN COUGH+CHEST CONG DM) 5-100 MG/5ML LIQD Take 10 mLs by mouth every 6 (six) hours as needed.   . risperiDONE (RISPERDAL) 0.25 MG tablet Take 0.25 mg by mouth daily.  . risperiDONE (RISPERDAL) 1 MG/ML oral solution Take 0.5 mg by mouth at bedtime.    No  facility-administered encounter medications on file as of 03/19/2018.     Review of Systems   Essentially unobtainable secondary to dementia please see HPI  Immunization History  Administered Date(s) Administered  . Influenza-Unspecified 06/08/2014, 06/03/2016, 06/05/2017  . Pneumococcal Conjugate-13 10/30/2015  . Pneumococcal-Unspecified 06/10/2016  . Tdap 05/22/2017   Pertinent  Health Maintenance Due  Topic Date Due  . INFLUENZA VACCINE  04/01/2018  . DEXA SCAN  Completed  . PNA vac Low Risk Adult  Completed   Fall Risk  05/21/2017  Falls in the  past year? Yes  Number falls in past yr: 1  Injury with Fall? Yes   Functional Status Survey:    Vitals:   03/19/18 1416  BP: 132/82  Pulse: 72  Resp: 20  Temp: 98 F (36.7 C)  TempSrc: Oral  SpO2: 96%  There is no height or weight on file to calculate BMI.Initial temperature was 99.2 with Tylenol did come down Physical Exam   In general this is a well-nourished elderly female in no distress  Her skin is warm and dry.  Eyes she has prescription lenses visual acuity appears to be intact sclera and conjunctive are clear did not really appreciate any drainage  Oropharynx is clear mucous membranes moist.  Chest he does have some slight coarse breath sounds are somewhat diffuse she has poor respiratory effort so difficult to fully tell- could not really appreciate wheezing There is no labored breathing  Heart is regular rate and rhythm with somewhat distant heart sounds- she has baseline lower extremity edema venous stasis changes  Abdomen is obese soft nontender with positive bowel sounds  Skeletal does not follow verbal commands but appears able to move her upper extremities at baseline has baseline lower extremity weakness.  Neurologic as noted above no lateralizing findings she is alert- and does speak at times Not agitated with exam today  Psych as noted above findings consistent with significant dementia   Labs reviewed: Recent Labs    08/27/17 0900 11/06/17 0700 01/29/18 0747  NA 141 141 140  K 4.2 3.7 3.7  CL 106 107 110  CO2 24 22 23   GLUCOSE 104* 89 96  BUN 36* 45* 39*  CREATININE 1.81* 1.93* 1.83*  CALCIUM 9.1 8.7* 8.9   Recent Labs    05/27/17 0955 08/27/17 0900  AST 14* 15  ALT 13* 31  ALKPHOS 74 85  BILITOT 0.4 0.4  PROT 6.8 7.4  ALBUMIN 3.1* 3.5   Recent Labs    08/27/17 0900 11/06/17 0700 01/29/18 0747  WBC 9.7 6.6 7.6  NEUTROABS 5.9 3.5 4.4  HGB 12.0 10.7* 11.1*  HCT 39.0 35.3* 35.5*  MCV 89.7 90.7 86.8  PLT 320 243 228   Lab  Results  Component Value Date   TSH 1.708 12/08/2016   Lab Results  Component Value Date   HGBA1C 5.2 07/14/2016   Lab Results  Component Value Date   CHOL 166 12/27/2015   HDL 44 12/27/2015   LDLCALC 114 (H) 12/27/2015   TRIG 39 12/27/2015   CHOLHDL 3.8 12/27/2015    Significant Diagnostic Results in last 30 days:  No results found.  Assessment/Plan  #1 continued cough with low-grade temp- temperature is somewhat elevated for her-she continues to have a cough and possibly some increased congestion We will empirically treat as a URI-bronchitis with Levaquin 500 mg for 1 day and then 250 mg for 5 additional days.  Also will make Robitussin routine every 6 hours x72 hours and then  as needed.  We will add a probiotic twice daily.  Also monitor vital signs pulse ox tQshift for 72 hours  [id number]- of note greater than 25 minutes spent assessing patient- reviewing her chart and labs-including renal function---discussing status with nursing as well as with her daughter at bedside- coordinating and formulating plan of care Of note greater than 50% of time spent coordinating a plan of care with input as noted above

## 2018-03-22 ENCOUNTER — Non-Acute Institutional Stay (SKILLED_NURSING_FACILITY): Payer: Medicare Other | Admitting: Internal Medicine

## 2018-03-22 ENCOUNTER — Encounter: Payer: Self-pay | Admitting: Internal Medicine

## 2018-03-22 DIAGNOSIS — F0391 Unspecified dementia with behavioral disturbance: Secondary | ICD-10-CM | POA: Diagnosis not present

## 2018-03-22 DIAGNOSIS — I10 Essential (primary) hypertension: Secondary | ICD-10-CM

## 2018-03-22 DIAGNOSIS — J069 Acute upper respiratory infection, unspecified: Secondary | ICD-10-CM | POA: Diagnosis not present

## 2018-03-22 DIAGNOSIS — N184 Chronic kidney disease, stage 4 (severe): Secondary | ICD-10-CM | POA: Diagnosis not present

## 2018-03-22 DIAGNOSIS — R05 Cough: Secondary | ICD-10-CM | POA: Diagnosis not present

## 2018-03-22 NOTE — Progress Notes (Signed)
Location:   [facility] Room Number: 106/D Place of Service:  SNF [phone]) Provider:  Freddi Starr, MD  Patient Care Team: Virgie Dad, MD as PCP - General (Internal Medicine) Virgie Dad, MD as Consulting Physician (Geriatric Medicine) Rolm Baptise as Physician Assistant (Internal Medicine)  Extended Emergency Contact Information Primary Emergency Contact: Genieve, [place], [place] [address] Johnnette Litter of [place] Phone: [phone] Mobile Phone: [phone] Relation: Daughter Secondary Emergency Contact: Bryanne, [place], [place] [address] [place] of [place] Phone: [phone] Relation: Son  Code Status:  DNR Goals of care: Advanced Directive information Advanced Directives 03/22/2018  Does Patient Have a Medical Advance Directive? Yes  Type of Advance Directive Out of facility DNR (pink MOST or yellow form)  Does patient want to make changes to medical advance directive? No - Patient declined  Copy of [place] in Chart? No - copy requested  Pre-existing out of facility DNR order (yellow form or pink MOST form) -     Chief Complaint  Patient presents with  . Acute Visit    F/U for Upper Respiratory Issues     HPI:  [name] seen today for an acute visit for follow-up of a suspected URI.  Patient has a recent history of coughing at one point but appeared to be wheezing- --chest x-ray was done at one point which did not show any acute process.  However cough persisted and she was started on a course of Levaquin which will be completed on July 24  She also has an order for Robitussin which was routine but is now PRN.  She would not really tolerate nebulizers because of her dementia and agitation it would produce   Her vital signs are stable chest congestion appears to be improved but she still has a cough   Vital signs appear to be stable is a long-term  resident of facility  She is a long-term resident of the facility with chronic kidney disease stage IV as well as anemia history of femur fracture and dementia with behavior problems appears to have stabilized significantly on the Risperdal  At one point her temperature was mildly elevated last week but this does not appear to be the case today    Past Medical History:  Diagnosis Date  . Alzheimer disease   . Anemia   . Anxiety   . Arthritis   . Hyperlipemia   . Hypertension   . Kidney calculus   . Pneumonia    Past Surgical History:  Procedure Laterality Date  . CHOLECYSTECTOMY    . HIP ARTHROPLASTY Left 11/18/2013   Procedure: ARTHROPLASTY BIPOLAR HIP;  Surgeon: Sanjuana Kava, MD;  Location: [facility];  Service: Orthopedics;  Laterality: Left;    Allergies  Allergen Reactions  . Sulfa Antibiotics Rash    Outpatient Encounter Medications as of 03/22/2018  Medication Sig  . aspirin EC 81 MG tablet Take 81 mg by mouth daily.  . calcium-vitamin D (OSCAL WITH D) 500-200 MG-UNIT tablet Take 1 tablet by mouth 2 (two) times daily.  Marland Kitchen Dextromethorphan-Guaifenesin (ROBITUSSIN COUGH+CHEST CONG DM) 5-100 MG/5ML LIQD Take 10 mLs by mouth every 6 (six) hours as needed.   Marland Kitchen levofloxacin (LEVAQUIN) 500 MG tablet Take 250 mg by mouth daily.  . Probiotic Product (RISA-BID PROBIOTIC) TABS Take 1 tablet by mouth  twice a day  . risperiDONE (RISPERDAL) 0.25 MG tablet Take 0.25 mg by mouth daily.  . risperiDONE (RISPERDAL) 1 MG/ML oral solution Take 0.5 mg by mouth at bedtime.    No facility-administered encounter medications on file as of 03/22/2018.     Review of Systems   Not possible secondary to dementia please see HPI  Immunization History  Administered Date(s) Administered  . Influenza-Unspecified 06/08/2014, 06/03/2016, 06/05/2017  . Pneumococcal Conjugate-13 10/30/2015  . Pneumococcal-Unspecified 06/10/2016  . Tdap 05/22/2017   Pertinent  Health Maintenance Due  Topic Date Due    . INFLUENZA VACCINE  04/01/2018  . DEXA SCAN  Completed  . PNA vac Low Risk Adult  Completed   Fall Risk  05/21/2017  Falls in the past year? Yes  Number falls in past yr: 1  Injury with Fall? Yes   Functional Status Survey:    Vitals:   03/22/18 1203 03/22/18 1204  BP: (!) 174/79 132/80  Pulse: 80   Resp: 19   Temp: 98.7 F (37.1 C)   TempSrc: Oral    Pulse is 88-respirations of 18 blood pressure taken manually 132/80-O2 saturation is 96% on room air Physical Exam   General this is a well-nourished elderly female in no distress  Her skin is warm and dry.  Eyes she has prescription lenses sclera conjunctive are clear visual acuity appears to be intact I do not see drainage.  Oropharynx is clear mucous membranes moist tongue is midline  Chest poor respiratory effort since she does not really follow verbal commands but could not really appreciate congestion today there is no labored breathing  Heart is regular rate and rhythm without murmur gallop or rub she has baseline venous stasis edema  Abdomen is obese soft nontender with positive bowel sounds  Musculoskeletal appears able to move all extremities at baseline with baseline lower extremity weakness  Neurologic appears grossly intact has limited speech but this appears to be clear- could not really appreciate lateralizing findings cranial nerves appear to be intact  Psych findings consistent with severe dementia as noted above-she is minimally agitated with exam-does not follow verbal commands    Labs reviewed: Recent Labs    08/27/17 0900 11/06/17 0700 01/29/18 0747  NA 141 141 140  K 4.2 3.7 3.7  CL 106 107 110  CO2 24 22 23   GLUCOSE 104* 89 96  BUN 36* 45* 39*  CREATININE 1.81* 1.93* 1.83*  CALCIUM 9.1 8.7* 8.9   Recent Labs    05/27/17 0955 08/27/17 0900  AST 14* 15  ALT 13* 31  ALKPHOS 74 85  BILITOT 0.4 0.4  PROT 6.8 7.4  ALBUMIN 3.1* 3.5   Recent Labs    08/27/17 0900 11/06/17 0700  01/29/18 0747  WBC 9.7 6.6 7.6  NEUTROABS 5.9 3.5 4.4  HGB 12.0 10.7* 11.1*  HCT 39.0 35.3* 35.5*  MCV 89.7 90.7 86.8  PLT 320 243 228   Lab Results  Component Value Date   TSH 1.708 12/08/2016   Lab Results  Component Value Date   HGBA1C 5.2 07/14/2016   Lab Results  Component Value Date   CHOL 166 12/27/2015   HDL 44 12/27/2015   LDLCALC 114 (H) 12/27/2015   TRIG 39 12/27/2015   CHOLHDL 3.8 12/27/2015    Significant Diagnostic Results in last 30 days:  No results found.  Assessment/Plan  #1 suspected URI with continued cough Will make her Robitussin routine for 5 days every 6 hours and then as needed.  Also will repeat a chest x-ray two-view.  Monitor vital signs and pulse ox for an additional 72 hours-  Chest congestion does appear to be somewhat improved but she does have a continued cough hopefully routine Robitussin will help but this will have to be monitored-- no sign of distress   2 hypertension-initial blood pressure today was in the 170s but suspect this may have been because of patient agitation I rechecked it manually and got a systolic in the 161W which appears to be more her baseline-at this point will monitor she is not on any blood pressure medications  #3 dementia with agitation this appears well controlled currently on Risperdal twice daily- at one point behaviors were significant issue but this appears to have moderated now for a considerable amount of time without signs of sedation was an issue earlier as well.    #4-history of chronic kidney disease--this is been relatively stable for an extended period of time last creatinine was 1.3 BUN 39 was done in late May and appears to be relatively baseline  [id number] of note greater than 25 minutes spent assessing patient-discussing status with her daughter at bedside-as well as with nursing staff- and coordinating a plan of care--greater than 50% of time spent coordinating plan of care with input as  noted above

## 2018-03-31 DIAGNOSIS — F0391 Unspecified dementia with behavioral disturbance: Secondary | ICD-10-CM | POA: Diagnosis not present

## 2018-03-31 DIAGNOSIS — F411 Generalized anxiety disorder: Secondary | ICD-10-CM | POA: Diagnosis not present

## 2018-04-09 ENCOUNTER — Non-Acute Institutional Stay (SKILLED_NURSING_FACILITY): Payer: Medicare Other | Admitting: Internal Medicine

## 2018-04-09 ENCOUNTER — Encounter: Payer: Self-pay | Admitting: Internal Medicine

## 2018-04-09 DIAGNOSIS — I10 Essential (primary) hypertension: Secondary | ICD-10-CM

## 2018-04-09 DIAGNOSIS — F0391 Unspecified dementia with behavioral disturbance: Secondary | ICD-10-CM

## 2018-04-09 DIAGNOSIS — N184 Chronic kidney disease, stage 4 (severe): Secondary | ICD-10-CM | POA: Diagnosis not present

## 2018-04-09 DIAGNOSIS — D631 Anemia in chronic kidney disease: Secondary | ICD-10-CM

## 2018-04-09 DIAGNOSIS — R55 Syncope and collapse: Secondary | ICD-10-CM | POA: Diagnosis not present

## 2018-04-09 NOTE — Progress Notes (Signed)
Location:    [facility] Room Number: 106/D Place of Service:  SNF (31) Provider:Journi Moffa Amada [place]   Virgie Dad, MD  Patient Care Team: Virgie Dad, MD as PCP - General (Internal Medicine) Virgie Dad, MD as Consulting Physician (Geriatric Medicine) Rolm Baptise as Physician Assistant (Internal Medicine)  Extended Emergency Contact Information Primary Emergency Contact: Smriti, [place], [place] [address] Johnnette Litter of [place] Phone: [phone] Mobile Phone: [phone] Relation: Daughter Secondary Emergency Contact: Stayce, Del[place], [place] [address] [place] of [place] Phone: [phone] Relation: Son  Code Status:  DNR Goals of care: Advanced Directive information Advanced Directives 04/09/2018  Does Patient Have a Medical Advance Directive? Yes  Type of Advance Directive Out of facility DNR (pink MOST or yellow form)  Does patient want to make changes to medical advance directive? No - Patient declined  Copy of [place] in Chart? No - copy requested  Pre-existing out of facility DNR order (yellow form or pink MOST form) -     Chief Complaint  Patient presents with  . Medical Management of Chronic Issues    Patient is being seen for routine visit of medical management  Medical management of chronic medical conditions including dementia-hypertension-chronic kidney disease-anemia of chronic disease- history of fever fracture and history of syncope in the past  HPI:  [name] seen today for medical management of chronic diseases.  As noted above-she continues to be quite stable.  I did see her couple weeks ago for continued cough-x-rays were negative for any acute process she did complete a course of antibiotics as well as routine Robitussin for suspected URI cough appears to have improved although has not completely cleared.  Her other medical issues appear to be stable  she is on Risperdal 3 times daily followed by psych services with a history of dementia with behaviors and this appears to be quite effective without oversedation.  She also has a history of chronic kidney disease which has been stable with most recent creatinine of 1.83 and BUN of 39 on lab done in late May.   Regards to anemia this is thought to be secondary to chronic disease last creatinine was 11.1 in late May and this appears able as well.  In regards to hypertension she is not on any medications but this appears to be relatively stable blood pressure today 122/78 previous 148/80 at times she will have systolic spikes but  Suspect  this is due to agitation   She also at one point had a history of recurrent syncope but she has not had this now in a considerable amount of time- when it does occur she has short unresponsive episodes and then quickly returns to her normal self- her daughter does not desire any aggressive work-up which is certainly understandable considering her severe dementia and advanced age  She also has a history of a femur fracture but is largely nonambulatory-she is on calcium with vitamin D  Currently she is lying in bed comfortably appears to be at her baseline is somewhat agitated with exam which is not unusual   Past Medical History:  Diagnosis Date  . Alzheimer disease   . Anemia   . Anxiety   . Arthritis   . Hyperlipemia   . Hypertension   . Kidney calculus   . Pneumonia  Past Surgical History:  Procedure Laterality Date  . CHOLECYSTECTOMY    . HIP ARTHROPLASTY Left 11/18/2013   Procedure: ARTHROPLASTY BIPOLAR HIP;  Surgeon: Sanjuana Kava, MD;  Location: [facility];  Service: Orthopedics;  Laterality: Left;    Allergies  Allergen Reactions  . Sulfa Antibiotics Rash    Outpatient Encounter Medications as of 04/09/2018  Medication Sig  . aspirin EC 81 MG tablet Take 81 mg by mouth daily.  . calcium-vitamin D (OSCAL WITH D) 500-200 MG-UNIT tablet Take  1 tablet by mouth 2 (two) times daily.  Marland Kitchen Dextromethorphan-Guaifenesin (ROBITUSSIN COUGH+CHEST CONG DM) 5-100 MG/5ML LIQD Take 10 mLs by mouth every 6 (six) hours as needed.   . risperiDONE (RISPERDAL) 0.25 MG tablet Take 0.25 mg by mouth daily.  . risperiDONE (RISPERDAL) 1 MG/ML oral solution Take 0.5 mg by mouth at bedtime.   . [DISCONTINUED] levofloxacin (LEVAQUIN) 500 MG tablet Take 250 mg by mouth daily.  . [DISCONTINUED] Probiotic Product (RISA-BID PROBIOTIC) TABS Take 1 tablet by mouth twice a day   No facility-administered encounter medications on file as of 04/09/2018.      Review of Systems   Unobtainable secondary to dementia  Immunization History  Administered Date(s) Administered  . Influenza-Unspecified 06/08/2014, 06/03/2016, 06/05/2017  . Pneumococcal Conjugate-13 10/30/2015  . Pneumococcal-Unspecified 06/10/2016  . Tdap 05/22/2017   Pertinent  Health Maintenance Due  Topic Date Due  . INFLUENZA VACCINE  05/10/2018 (Originally 04/01/2018)  . DEXA SCAN  Completed  . PNA vac Low Risk Adult  Completed   Fall Risk  05/21/2017  Falls in the past year? Yes  Number falls in past yr: 1  Injury with Fall? Yes   Functional Status Survey:    Vitals:   04/09/18 1529  BP: 122/78  Pulse: 74  Resp: 20  Temp: (!) 97.4 F (36.3 C)  TempSrc: Oral  SpO2: 100%  Weight: 192 lb 9.6 oz (87.4 kg)  Height: 5\' 1"  (1.549 m)   Body mass index is 36.39 kg/m. Physical Exam In general this is a well-nourished elderly female no distress resting comfortably in bed.  Her skin is warm and dry she has numerou seborrheic keratosis which is not new.  Eyes she has prescription lenses sclera and conjunctive are clear visual acuity appears to be intact she does make eye contact.  Oropharynx is clear mucous membranes moist.  Chest is clear to auscultation she has poor respiratory effort but could not really appreciate any significant congestion no labored breathing.  Heart is regular  rate and rhythm without murmur gallop or rub she has venous stasis edema which appears unchanged from previous exams.  Her abdomen is obese soft nontender with positive bowel sounds.  Musculoskeletal continues to be limited exam since she does not follow verbal commands but appears to be at baseline appears capable of moving her upper extremities at baseline with continued lower extremity weakness.  Neurologic as noted above she is alert could not really appreciate any lateralizing findings-cranial nerves appear to be intact- she does speak some but fairly nonsensically.  Psych as noted above she is somewhat agitated with exam--but this is not unusual Labs reviewed: Recent Labs    08/27/17 0900 11/06/17 0700 01/29/18 0747  NA 141 141 140  K 4.2 3.7 3.7  CL 106 107 110  CO2 24 22 23   GLUCOSE 104* 89 96  BUN 36* 45* 39*  CREATININE 1.81* 1.93* 1.83*  CALCIUM 9.1 8.7* 8.9   Recent Labs    05/27/17 0955  08/27/17 0900  AST 14* 15  ALT 13* 31  ALKPHOS 74 85  BILITOT 0.4 0.4  PROT 6.8 7.4  ALBUMIN 3.1* 3.5   Recent Labs    08/27/17 0900 11/06/17 0700 01/29/18 0747  WBC 9.7 6.6 7.6  NEUTROABS 5.9 3.5 4.4  HGB 12.0 10.7* 11.1*  HCT 39.0 35.3* 35.5*  MCV 89.7 90.7 86.8  PLT 320 243 228   Lab Results  Component Value Date   TSH 1.708 12/08/2016   Lab Results  Component Value Date   HGBA1C 5.2 07/14/2016   Lab Results  Component Value Date   CHOL 166 12/27/2015   HDL 44 12/27/2015   LDLCALC 114 (H) 12/27/2015   TRIG 39 12/27/2015   CHOLHDL 3.8 12/27/2015    Significant Diagnostic Results in last 30 days:  No results found.  Assessment/Plan  #1 dementia with behaviors- this appears to be stable on the Risperdal twice daily as noted above Weight appears to be stable to actually slightly gating of about 5 pounds over the past 2 to 3 months. she is on Risperdal CBGs appear to be stable-- most recently 118   2- hypertension as noted above does not appear she  has consistent elevations- - at times it is difficult to get blood pressures as she is agitated.   3 history of chronic kidney disease again this appears relatively stable- creatinine most recently of 1.83- BUN of 39- she apparently eats and drinks quite well-her daughter is very supportive and often helps feed her.  4.  Anemia of chronic disease this continues to be stable most recent hemoglobin 11.1.   #5 history of syncope- has not had an episode in a considerable amount of time-again this has not been worked up because of her advanced age and dementia.  6.-History of femur fracture she is on calcium with vitamin D largely ambulatory in a wheelchair-complicated with severe dementia at this point continue to monitor she does well with supportive care.  7.  History of recent URI-she continues with PRN Robitussin has completed antibiotic and routine Robitussin this appears to have improved although at times she still will have a cough   [id number]      2.-

## 2018-04-11 ENCOUNTER — Encounter: Payer: Self-pay | Admitting: Internal Medicine

## 2018-05-24 DIAGNOSIS — B351 Tinea unguium: Secondary | ICD-10-CM | POA: Diagnosis not present

## 2018-05-24 DIAGNOSIS — I739 Peripheral vascular disease, unspecified: Secondary | ICD-10-CM | POA: Diagnosis not present

## 2018-05-25 ENCOUNTER — Non-Acute Institutional Stay (SKILLED_NURSING_FACILITY): Payer: Medicare Other | Admitting: Internal Medicine

## 2018-05-25 ENCOUNTER — Encounter: Payer: Self-pay | Admitting: Internal Medicine

## 2018-05-25 DIAGNOSIS — Z87898 Personal history of other specified conditions: Secondary | ICD-10-CM | POA: Diagnosis not present

## 2018-05-25 DIAGNOSIS — I10 Essential (primary) hypertension: Secondary | ICD-10-CM

## 2018-05-25 DIAGNOSIS — N184 Chronic kidney disease, stage 4 (severe): Secondary | ICD-10-CM

## 2018-05-25 DIAGNOSIS — D631 Anemia in chronic kidney disease: Secondary | ICD-10-CM

## 2018-05-25 DIAGNOSIS — F0391 Unspecified dementia with behavioral disturbance: Secondary | ICD-10-CM

## 2018-05-25 NOTE — Progress Notes (Signed)
Location:    [facility] Room Number: 106/D Place of Service:  SNF [phone]) Provider: Granville Lewis PA-C  Virgie Dad, MD  Patient Care Team: Virgie Dad, MD as PCP - General (Internal Medicine) Virgie Dad, MD as Consulting Physician (Geriatric Medicine) Rolm Baptise as Physician Assistant (Internal Medicine)  Extended Emergency Contact Information Primary Emergency Contact: Lavetta, [place], [place] [address] Johnnette Litter of [place] Phone: [phone] Mobile Phone: [phone] Relation: Daughter Secondary Emergency Contact: Abrial, Arr[place], [place] [address] [place] of [place] Phone: [phone] Relation: Son  Code Status:  DNR Goals of care: Advanced Directive information Advanced Directives 05/25/2018  Does Patient Have a Medical Advance Directive? Yes  Type of Advance Directive Out of facility DNR (pink MOST or yellow form)  Does patient want to make changes to medical advance directive? No - Patient declined  Copy of [place] in Chart? No - copy requested  Pre-existing out of facility DNR order (yellow form or pink MOST form) -     Chief Complaint  Patient presents with  . Medical Management of Chronic Issues    Patient is being  seen for routine visit of  medical management  Medical management of chronic medical conditions including dementia-as well as chronic kidney disease-hypertension- anemia of chronic disease-history of femur fracture and history of syncope in the past.    HPI:  [name] seen today for medical management of chronic diseases.  As noted above. \ She continues to be quite stable nursing does not report any recent issues and neither does her daughter.  She is on Risperdal twice a day she has been followed by psych services as well because she does have a history of dementia with behaviors but this is been stable now for an extended period of time she  appears to have responded very well to the Risperdal.--Her weight is stable at around 189 pounds  She also has a history of chronic kidney disease last creatinine was 1.83 with a BUN of 39 on lab done in late May- we will try to update this although again obtaining labs at times is difficult because of her agitation.  In regards to anemia hemoglobin appears stable on recent lab in late May at 11.1 again will try to have this updated if possible.  She is on minimal medications essentially aspirin calcium and Risperdal and has Robitussin as needed for cough which she occasionally will develop  In regards to hypertension she is not on any medication she appears to have some variable blood pressures appear systolics run from the [id number]- it appears per previous readings she has some variation suspect this is due to her agitation at times-.  At this point will monitor it does not appear this is consistent and at times obtaining blood pressures is difficult as well because of agitation with the invasive maneuver.  Regards to her femur fracture history she is nonambulatory is on calcium with vitamin D.  Currently she is sitting in her wheelchair comfortably her daughter is feeding her her daughter is very involved in her care and is here frequently helping her      Past Medical History:  Diagnosis Date  . Alzheimer disease   . Anemia   . Anxiety   . Arthritis   . Hyperlipemia   . Hypertension   .  Kidney calculus   . Pneumonia    Past Surgical History:  Procedure Laterality Date  . CHOLECYSTECTOMY    . HIP ARTHROPLASTY Left 11/18/2013   Procedure: ARTHROPLASTY BIPOLAR HIP;  Surgeon: Sanjuana Kava, MD;  Location: [facility];  Service: Orthopedics;  Laterality: Left;    Allergies  Allergen Reactions  . Sulfa Antibiotics Rash    Outpatient Encounter Medications as of 05/25/2018  Medication Sig  . aspirin EC 81 MG tablet Take 81 mg by mouth daily.  . calcium-vitamin D (OSCAL WITH D)  500-200 MG-UNIT tablet Take 1 tablet by mouth 2 (two) times daily.  Marland Kitchen Dextromethorphan-Guaifenesin (ROBITUSSIN COUGH+CHEST CONG DM) 5-100 MG/5ML LIQD Take 10 mLs by mouth every 6 (six) hours as needed.   . risperiDONE (RISPERDAL) 0.25 MG tablet Take 0.25 mg by mouth daily.  . risperiDONE (RISPERDAL) 1 MG/ML oral solution Take 0.5 mg by mouth at bedtime.    No facility-administered encounter medications on file as of 05/25/2018.      Review of Systems   Is unobtainable secondary to dementia  Immunization History  Administered Date(s) Administered  . Influenza-Unspecified 06/08/2014, 06/03/2016, 06/05/2017  . Pneumococcal Conjugate-13 10/30/2015  . Pneumococcal-Unspecified 06/10/2016  . Tdap 05/22/2017   Pertinent  Health Maintenance Due  Topic Date Due  . INFLUENZA VACCINE  06/24/2018 (Originally 04/01/2018)  . DEXA SCAN  Completed  . PNA vac Low Risk Adult  Completed   Fall Risk  05/21/2017  Falls in the past year? Yes  Number falls in past yr: 1  Injury with Fall? Yes   Functional Status Survey:    Vitals:   05/25/18 1215  BP: 120/64  Pulse: 78  Resp: 19  Temp: (!) 97 F (36.1 C)  TempSrc: Oral  SpO2: 100%  Weight: 189 lb (85.7 kg)  Height: 5\' 1"  (1.549 m)   Body mass index is 35.71 kg/m. Physical Exam   In general this is a well-nourished elderly female in no distress sitting comfortably in her wheelchair she is being fed by her daughter.  Her skin is warm and dry she has numerous seborrheic keratosis which appear to be chronic.  Eyes she has prescription lenses sclera and conjunctive are clear visual acuity appears grossly intact.  Oropharynx she does have some food residue on her tongue.  Her chest is clear to auscultation with poor respiratory effort could not really appreciate any congestion there is no labored breathing or sign of distress.  Heart is distant heart sounds regular rate and rhythm- she has venous stasis edema which appears relatively  unchanged.  Her abdomen is obese soft nontender with positive bowel sounds.  Musculoskeletal- she does not follow any verbal commands but appears to be at her baseline moving upper extremities she does have baseline lower extremity weakness.  Neurologic as noted above she is alert she is not overtly agitated with exam today- cannot really appreciate any lateralizing findings.  Cranial nerves appear to be intact.  Psych she is alert does not follow simple verbal commands is not really agitated with exam today  Labs reviewed: Recent Labs    08/27/17 0900 11/06/17 0700 01/29/18 0747  NA 141 141 140  K 4.2 3.7 3.7  CL 106 107 110  CO2 24 22 23   GLUCOSE 104* 89 96  BUN 36* 45* 39*  CREATININE 1.81* 1.93* 1.83*  CALCIUM 9.1 8.7* 8.9   Recent Labs    05/27/17 0955 08/27/17 0900  AST 14* 15  ALT 13* 31  ALKPHOS 74 85  BILITOT 0.4 0.4  PROT 6.8 7.4  ALBUMIN 3.1* 3.5   Recent Labs    08/27/17 0900 11/06/17 0700 01/29/18 0747  WBC 9.7 6.6 7.6  NEUTROABS 5.9 3.5 4.4  HGB 12.0 10.7* 11.1*  HCT 39.0 35.3* 35.5*  MCV 89.7 90.7 86.8  PLT 320 243 228   Lab Results  Component Value Date   TSH 1.708 12/08/2016   Lab Results  Component Value Date   HGBA1C 5.2 07/14/2016   Lab Results  Component Value Date   CHOL 166 12/27/2015   HDL 44 12/27/2015   LDLCALC 114 (H) 12/27/2015   TRIG 39 12/27/2015   CHOLHDL 3.8 12/27/2015    Significant Diagnostic Results in last 30 days:  No results found.  Assessment/Plan  #1 history of dementia with behaviors- this continues to be stable on the Risperdal twice daily.  Her weight appears to be stable- CBGs appear to be stable in the 80s to 90s recently   we checked this  because she is on Risperdal  #2 hypertension as noted above this appears to be relatively stable not on any antihypertensives.  3.-Chronic kidney disease- again this has appeared stable on most recent lab we will try to update this last creatinine of 1.83  BUN of 39 were relatively baseline-again obtain labs at times is difficult because of agitation.  4.  History of anemia hemoglobin has been stable at 11.1 on May lab again will try to update this if possible.  5.  History of syncope-at one point she did have this semi-frequently but has not had an episode now in a long period of time-- there is been no work-up because of her advanced age and dementia  #6  History of femur fracture she continues on calcium with vitamin D continues to be nonambulatory she does not appear to have any pain issues.  7.-History of recent URI she does have PRN Robitussin as needed she has not had a recent episode here in several weeks.  [id number]

## 2018-05-29 DIAGNOSIS — F0391 Unspecified dementia with behavioral disturbance: Secondary | ICD-10-CM | POA: Diagnosis not present

## 2018-05-29 DIAGNOSIS — F411 Generalized anxiety disorder: Secondary | ICD-10-CM | POA: Diagnosis not present

## 2018-06-01 ENCOUNTER — Non-Acute Institutional Stay (SKILLED_NURSING_FACILITY): Payer: Medicare Other

## 2018-06-01 DIAGNOSIS — Z0000 Encounter for general adult medical examination without abnormal findings: Secondary | ICD-10-CM | POA: Diagnosis not present

## 2018-06-01 NOTE — Progress Notes (Signed)
Subjective:   [name] is a 84 y.o. female who presents for Medicare Annual (Subsequent) preventive examination at [facility]; incapacitated patient unable to answer questions appropriately  Last AWV-05/21/2017    Objective:     Vitals: BP 121/62 (BP Location: Left Arm, Patient Position: Sitting)   Pulse 75   Temp (!) 97.5 F (36.4 C) (Oral)   Ht 5\' 1"  (1.549 m)   Wt 189 lb (85.7 kg)   BMI 35.71 kg/m   Body mass index is 35.71 kg/m.  Advanced Directives 06/01/2018 05/25/2018 04/09/2018 03/22/2018 03/19/2018 03/15/2018 01/28/2018  Does Patient Have a Medical Advance Directive? Yes Yes Yes Yes Yes Yes Yes  Type of Advance Directive Out of facility DNR (pink MOST or yellow form) Out of facility DNR (pink MOST or yellow form) Out of facility DNR (pink MOST or yellow form) Out of facility DNR (pink MOST or yellow form) Out of facility DNR (pink MOST or yellow form) Out of facility DNR (pink MOST or yellow form) Out of facility DNR (pink MOST or yellow form)  Does patient want to make changes to medical advance directive? No - Patient declined No - Patient declined No - Patient declined No - Patient declined No - Patient declined No - Patient declined No - Patient declined  Copy of [place] in Chart? No - copy requested No - copy requested No - copy requested No - copy requested No - copy requested No - copy requested No - copy requested  Pre-existing out of facility DNR order (yellow form or pink MOST form) Yellow form placed in chart (order not valid for inpatient use);Pink MOST form placed in chart (order not valid for inpatient use) - - - - - -    Tobacco Social History   Tobacco Use  Smoking Status Never Smoker  Smokeless Tobacco Never Used     Counseling given: Not Answered   Clinical Intake:  Pre-visit preparation completed: No  Pain : Faces Faces Pain Scale: No hurt  Faces Pain Scale: No hurt     How often do you need to have  someone help you when you read instructions, pamphlets, or other written materials from your doctor or pharmacy?: 5 - Always(dementia)  Interpreter Needed?: No  Information entered by :: Tyson Dense, RN  Past Medical History:  Diagnosis Date  . Alzheimer disease ([place])   . Anemia   . Anxiety   . Arthritis   . Hyperlipemia   . Hypertension   . Kidney calculus   . Pneumonia    Past Surgical History:  Procedure Laterality Date  . CHOLECYSTECTOMY    . HIP ARTHROPLASTY Left 11/18/2013   Procedure: ARTHROPLASTY BIPOLAR HIP;  Surgeon: Sanjuana Kava, MD;  Location: [facility];  Service: Orthopedics;  Laterality: Left;   Family History  Problem Relation Age of Onset  . Alzheimer's disease Mother   . Heart attack Father    Social History   Socioeconomic History  . Marital status: Widowed    Spouse name: Not on file  . Number of children: Not on file  . Years of education: Not on file  . Highest education level: Not on file  Occupational History  . Not on file  Social Needs  . Financial resource strain: Not on file  . Food insecurity:    Worry: Not on file    Inability: Not on file  . Transportation needs:    Medical: Not on file  Non-medical: Not on file  Tobacco Use  . Smoking status: Never Smoker  . Smokeless tobacco: Never Used  Substance and Sexual Activity  . Alcohol use: No  . Drug use: No  . Sexual activity: Not on file  Lifestyle  . Physical activity:    Days per week: Not on file    Minutes per session: Not on file  . Stress: Not on file  Relationships  . Social connections:    Talks on phone: Not on file    Gets together: Not on file    Attends religious service: Not on file    Active member of club or organization: Not on file    Attends meetings of clubs or organizations: Not on file    Relationship status: Not on file  Other Topics Concern  . Not on file  Social History Narrative  . Not on file    Outpatient Encounter Medications as of  06/01/2018  Medication Sig  . aspirin EC 81 MG tablet Take 81 mg by mouth daily.  . calcium-vitamin D (OSCAL WITH D) 500-200 MG-UNIT tablet Take 1 tablet by mouth 2 (two) times daily.  Marland Kitchen Dextromethorphan-Guaifenesin (ROBITUSSIN COUGH+CHEST CONG DM) 5-100 MG/5ML LIQD Take 10 mLs by mouth every 6 (six) hours as needed.   . risperiDONE (RISPERDAL) 0.25 MG tablet Take 0.25 mg by mouth daily.  . risperiDONE (RISPERDAL) 1 MG/ML oral solution Take 0.5 mg by mouth at bedtime.    No facility-administered encounter medications on file as of 06/01/2018.     Activities of Daily Living In your present state of health, do you have any difficulty performing the following activities: 06/01/2018  Hearing? N  Vision? N  Difficulty concentrating or making decisions? Y  Walking or climbing stairs? Y  Dressing or bathing? Y  Doing errands, shopping? Y  Preparing Food and eating ? Y  Using the Toilet? Y  In the past six months, have you accidently leaked urine? Y  Do you have problems with loss of bowel control? Y  Managing your Medications? Y  Managing your Finances? Y  Housekeeping or managing your Housekeeping? Y  Some recent data might be hidden    Patient Care Team: Virgie Dad, MD as PCP - General (Internal Medicine) Virgie Dad, MD as Consulting Physician (Geriatric Medicine) Rolm Baptise as Physician Assistant (Internal Medicine)    Assessment:   This is a routine wellness examination for [name].  Exercise Activities and Dietary recommendations Current Exercise Habits: The patient does not participate in regular exercise at present, Exercise limited by: neurologic condition(s);orthopedic condition(s)  Goals   None     Fall Risk Fall Risk  06/01/2018 05/21/2017  Falls in the past year? No Yes  Number falls in past yr: - 1  Injury with Fall? - Yes   Is the patient's home free of loose throw rugs in walkways, pet beds, electrical cords, etc?   yes      Grab bars in the  bathroom? yes      Handrails on the stairs?   yes      Adequate lighting?   yes  Depression Screen PHQ 2/9 Scores 06/01/2018 05/21/2017  Exception Documentation Medical reason Medical reason     Cognitive Function MMSE - Mini Mental State Exam 06/01/2018 05/21/2017  Not completed: Unable to complete Unable to complete        Immunization History  Administered Date(s) Administered  . Influenza-Unspecified 06/08/2014, 06/03/2016, 06/05/2017  . Pneumococcal Conjugate-13 10/30/2015  .  Pneumococcal-Unspecified 06/10/2016  . Tdap 05/22/2017    Qualifies for Shingles Vaccine? Not in past records  Screening Tests Health Maintenance  Topic Date Due  . INFLUENZA VACCINE  06/24/2018 (Originally 04/01/2018)  . TETANUS/TDAP  05/23/2027  . DEXA SCAN  Completed  . PNA vac Low Risk Adult  Completed    Cancer Screenings: Lung: Low Dose CT Chest recommended if Age 4-80 years, 30 pack-year currently smoking OR have quit w/in 15years. Patient does not qualify. Breast:  Up to date on Mammogram? Yes   Up to date of Bone Density/Dexa? Yes Colorectal: up to date  Additional Screenings:  Hepatitis C Screening: unable to accept or decline Flu vaccine due: will receive at [facility]     Plan:    I have personally reviewed and addressed the Medicare Annual Wellness questionnaire and have noted the following in the patient's chart:  A. Medical and social history B. Use of alcohol, tobacco or illicit drugs  C. Current medications and supplements D. Functional ability and status E.  Nutritional status F.  Physical activity G. Advance directives H. List of other physicians I.  Hospitalizations, surgeries, and ER visits in previous 12 months J.  [place] to include hearing, vision, cognitive, depression L. Referrals and appointments - none  In addition, I am unable to review and discuss with incapacitated patient certain preventive protocols, quality metrics, and best practice  recommendations. A written personalized care plan for preventive services as well as general preventive health recommendations were provided to patient.   See attached scanned questionnaire for additional information.   Signed,   Tyson Dense, RN Nurse Health Advisor

## 2018-06-01 NOTE — Patient Instructions (Signed)
[name] , Thank you for taking time to come for your Medicare Wellness Visit. I appreciate your ongoing commitment to your health goals. Please review the following plan we discussed and let me know if I can assist you in the future.   Screening recommendations/referrals: Colonoscopy excluded, over age 82  Mammogram excluded, over age 58 Bone Density up to date Recommended yearly ophthalmology/optometry visit for glaucoma screening and checkup Recommended yearly dental visit for hygiene and checkup  Vaccinations: Influenza vaccine due, will receive at [facility] Pneumococcal vaccine up to date, completed Tdap vaccine up to date, due 05/23/2027 Shingles vaccine not in past records    Advanced directives: in chart  Conditions/risks identified: none  Next appointment: Dr. Lyndel Safe makes rounds   Preventive Care 65 Years and Older, Female Preventive care refers to lifestyle choices and visits with your health care provider that can promote health and wellness. What does preventive care include?  A yearly physical exam. This is also called an annual well check.  Dental exams once or twice a year.  Routine eye exams. Ask your health care provider how often you should have your eyes checked.  Personal lifestyle choices, including:  Daily care of your teeth and gums.  Regular physical activity.  Eating a healthy diet.  Avoiding tobacco and drug use.  Limiting alcohol use.  Practicing safe sex.  Taking low-dose aspirin every day.  Taking vitamin and mineral supplements as recommended by your health care provider. What happens during an annual well check? The services and screenings done by your health care provider during your annual well check will depend on your age, overall health, lifestyle risk factors, and family history of disease. Counseling  Your health care provider may ask you questions about your:  Alcohol use.  Tobacco use.  Drug use.  Emotional  well-being.  Home and relationship well-being.  Sexual activity.  Eating habits.  History of falls.  Memory and ability to understand (cognition).  Work and work Statistician.  Reproductive health. Screening  You may have the following tests or measurements:  Height, weight, and BMI.  Blood pressure.  Lipid and cholesterol levels. These may be checked every 5 years, or more frequently if you are over 109 years old.  Skin check.  Lung cancer screening. You may have this screening every year starting at age 42 if you have a 30-pack-year history of smoking and currently smoke or have quit within the past 15 years.  Fecal occult blood test (FOBT) of the stool. You may have this test every year starting at age 67.  Flexible sigmoidoscopy or colonoscopy. You may have a sigmoidoscopy every 5 years or a colonoscopy every 10 years starting at age 76.  Hepatitis C blood test.  Hepatitis B blood test.  Sexually transmitted disease (STD) testing.  Diabetes screening. This is done by checking your blood sugar (glucose) after you have not eaten for a while (fasting). You may have this done every 1-3 years.  Bone density scan. This is done to screen for osteoporosis. You may have this done starting at age 77.  Mammogram. This may be done every 1-2 years. Talk to your health care provider about how often you should have regular mammograms. Talk with your health care provider about your test results, treatment options, and if necessary, the need for more tests. Vaccines  Your health care provider may recommend certain vaccines, such as:  Influenza vaccine. This is recommended every year.  Tetanus, diphtheria, and acellular pertussis (Tdap, Td)  vaccine. You may need a Td booster every 10 years.  Zoster vaccine. You may need this after age 42.  Pneumococcal 13-valent conjugate (PCV13) vaccine. One dose is recommended after age 4.  Pneumococcal polysaccharide (PPSV23) vaccine. One  dose is recommended after age 2. Talk to your health care provider about which screenings and vaccines you need and how often you need them. This information is not intended to replace advice given to you by your health care provider. Make sure you discuss any questions you have with your health care provider. Document Released: 09/14/2015 Document Revised: 05/07/2016 Document Reviewed: 06/19/2015 Elsevier Interactive Patient Education  2017 [place] Prevention in the Home Falls can cause injuries. They can happen to people of all ages. There are many things you can do to make your home safe and to help prevent falls. What can I do on the outside of my home?  Regularly fix the edges of walkways and driveways and fix any cracks.  Remove anything that might make you trip as you walk through a door, such as a raised step or threshold.  Trim any bushes or trees on the path to your home.  Use bright outdoor lighting.  Clear any walking paths of anything that might make someone trip, such as rocks or tools.  Regularly check to see if handrails are loose or broken. Make sure that both sides of any steps have handrails.  Any raised decks and porches should have guardrails on the edges.  Have any leaves, snow, or ice cleared regularly.  Use sand or salt on walking paths during winter.  Clean up any spills in your garage right away. This includes oil or grease spills. What can I do in the bathroom?  Use night lights.  Install grab bars by the toilet and in the tub and shower. Do not use towel bars as grab bars.  Use non-skid mats or decals in the tub or shower.  If you need to sit down in the shower, use a plastic, non-slip stool.  Keep the floor dry. Clean up any water that spills on the floor as soon as it happens.  Remove soap buildup in the tub or shower regularly.  Attach bath mats securely with double-sided non-slip rug tape.  Do not have throw rugs and other  things on the floor that can make you trip. What can I do in the bedroom?  Use night lights.  Make sure that you have a light by your bed that is easy to reach.  Do not use any sheets or blankets that are too big for your bed. They should not hang down onto the floor.  Have a firm chair that has side arms. You can use this for support while you get dressed.  Do not have throw rugs and other things on the floor that can make you trip. What can I do in the kitchen?  Clean up any spills right away.  Avoid walking on wet floors.  Keep items that you use a lot in easy-to-reach places.  If you need to reach something above you, use a strong step stool that has a grab bar.  Keep electrical cords out of the way.  Do not use floor polish or wax that makes floors slippery. If you must use wax, use non-skid floor wax.  Do not have throw rugs and other things on the floor that can make you trip. What can I do with my stairs?  Do not leave  any items on the stairs.  Make sure that there are handrails on both sides of the stairs and use them. Fix handrails that are broken or loose. Make sure that handrails are as long as the stairways.  Check any carpeting to make sure that it is firmly attached to the stairs. Fix any carpet that is loose or worn.  Avoid having throw rugs at the top or bottom of the stairs. If you do have throw rugs, attach them to the floor with carpet tape.  Make sure that you have a light switch at the top of the stairs and the bottom of the stairs. If you do not have them, ask someone to add them for you. What else can I do to help prevent falls?  Wear shoes that:  Do not have high heels.  Have rubber bottoms.  Are comfortable and fit you well.  Are closed at the toe. Do not wear sandals.  If you use a stepladder:  Make sure that it is fully opened. Do not climb a closed stepladder.  Make sure that both sides of the stepladder are locked into place.  Ask  someone to hold it for you, if possible.  Clearly mark and make sure that you can see:  Any grab bars or handrails.  First and last steps.  Where the edge of each step is.  Use tools that help you move around (mobility aids) if they are needed. These include:  Canes.  Walkers.  Scooters.  Crutches.  Turn on the lights when you go into a dark area. Replace any light bulbs as soon as they burn out.  Set up your furniture so you have a clear path. Avoid moving your furniture around.  If any of your floors are uneven, fix them.  If there are any pets around you, be aware of where they are.  Review your medicines with your doctor. Some medicines can make you feel dizzy. This can increase your chance of falling. Ask your doctor what other things that you can do to help prevent falls. This information is not intended to replace advice given to you by your health care provider. Make sure you discuss any questions you have with your health care provider. Document Released: 06/14/2009 Document Revised: 01/24/2016 Document Reviewed: 09/22/2014 Elsevier Interactive Patient Education  2017 [employer].

## 2018-06-29 IMAGING — CT CT HEAD W/O CM
4 of 5 series · 16 of 47 positions shown, 18 images · non-contrast
Comparison: CT HEAD and cervical spine November 17, 2013

CLINICAL DATA: Fell in stand up lift this evening at 3748 hours. No
loss of consciousness. History of Alzheimer's disease, hypertension.

EXAM:
CT HEAD WITHOUT CONTRAST
CT CERVICAL SPINE WITHOUT CONTRAST
TECHNIQUE: Multidetector CT imaging of the head and cervical spine was
performed following the standard protocol without intravenous
contrast. Multiplanar CT image reconstructions of the cervical spine
were also generated.

[Series 2: head wo · axial · 0.39mm/px · z∈[+261,+366]mm · 7 of 29 slices shown, 9 images]
[im 4/29  brain]
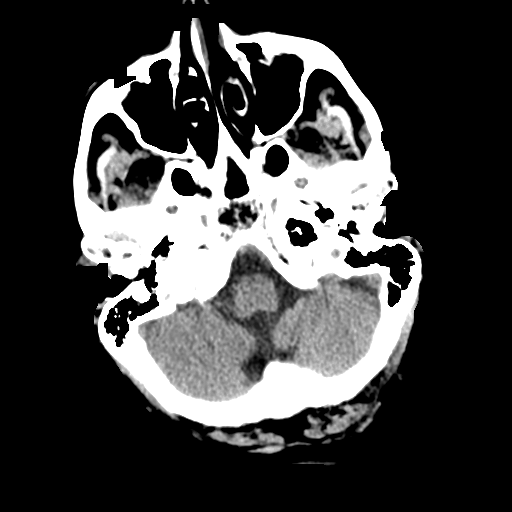
[im 4/29  bone]
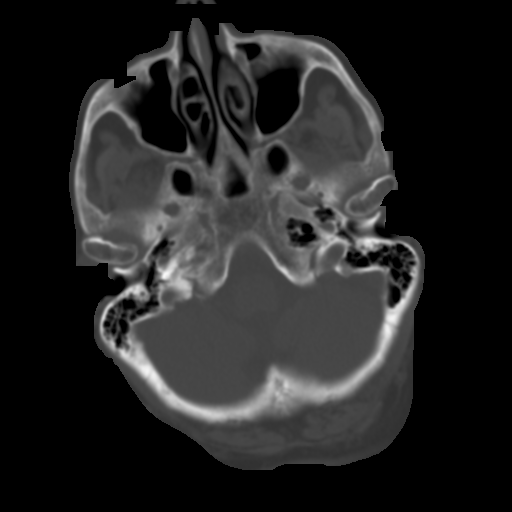
[im 8/29  brain]
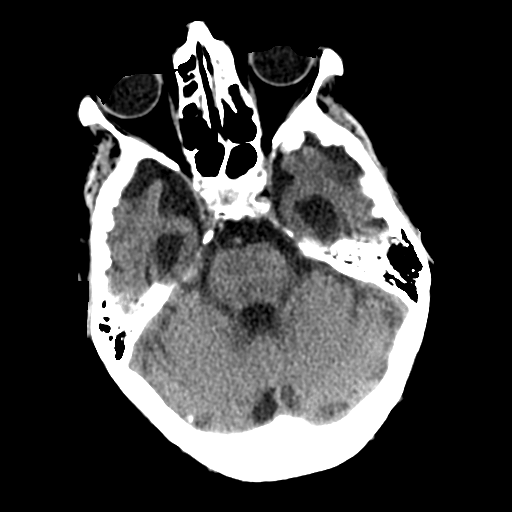
[im 11/29  brain]
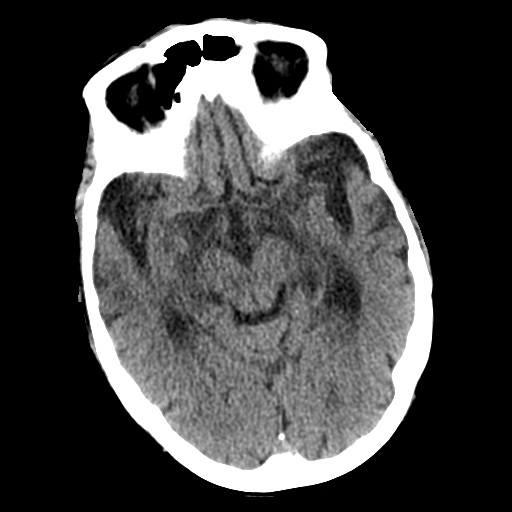
[im 15/29  brain]
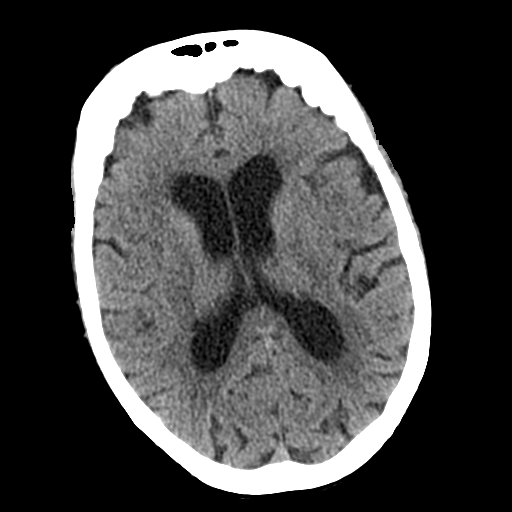
[im 18/29  brain]
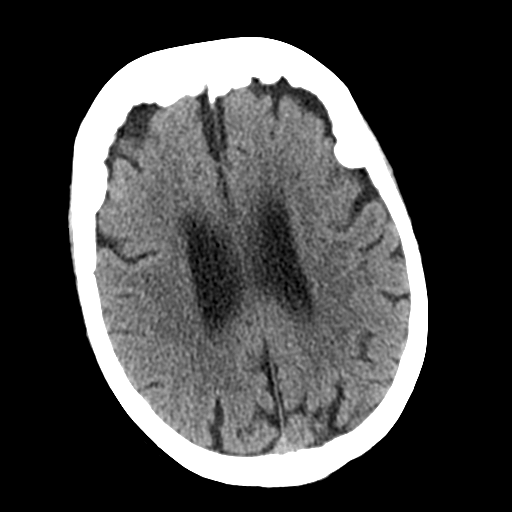
[im 18/29  bone]
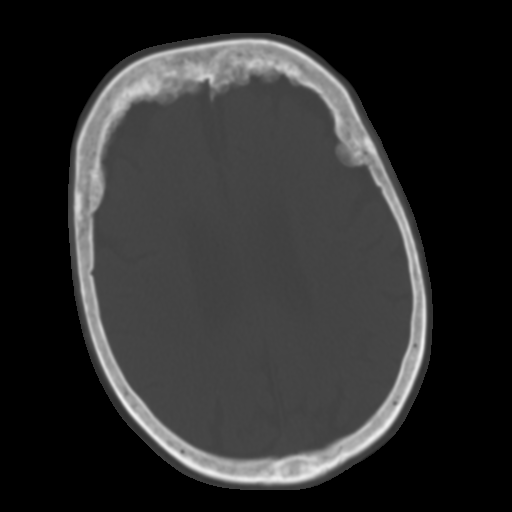
[im 22/29  brain]
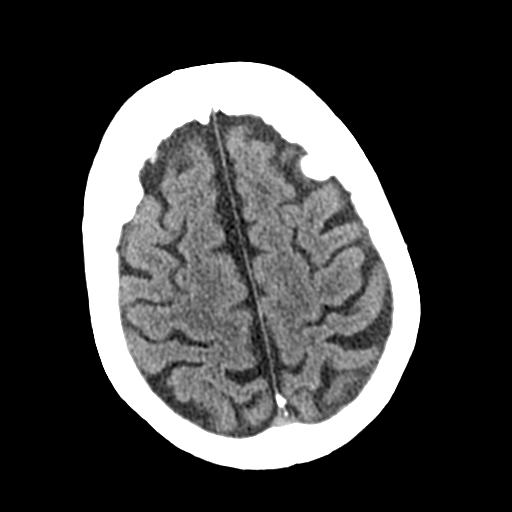
[im 25/29  brain]
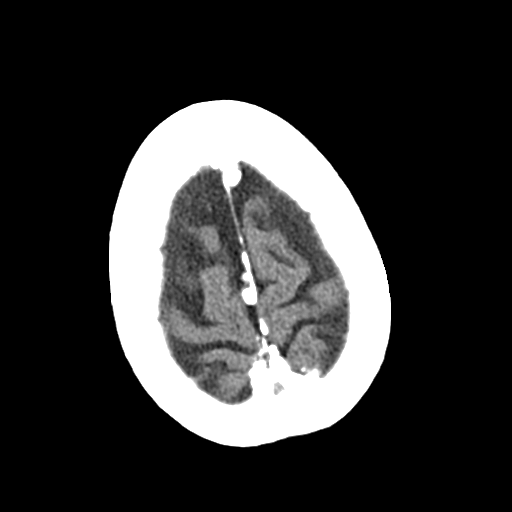

[Series 4: coronal soft tissue · coronal · 0.27mm/px · 3 of 61 slices shown]
[im 21/61  brain]
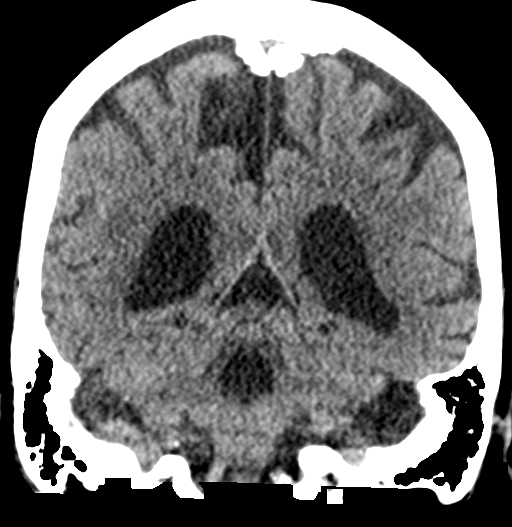
[im 27/61  brain]
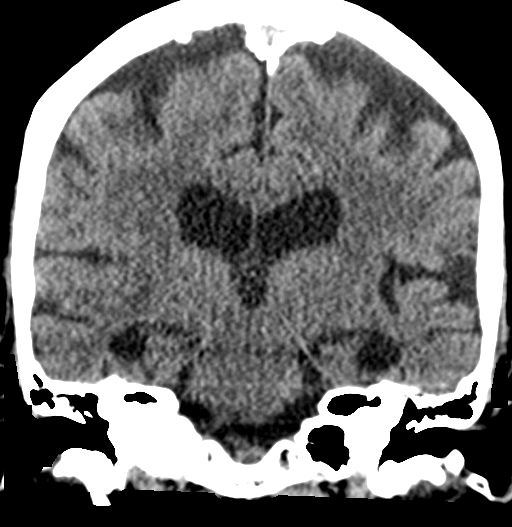
[im 34/61  brain]
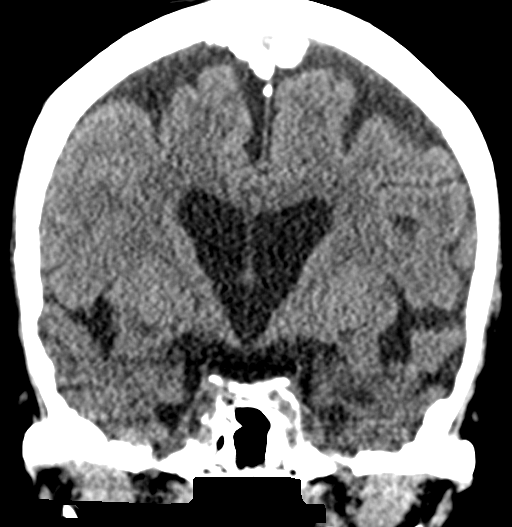

[Series 5: sagittal soft tissue · sagittal · 0.28mm/px · 1 of 45 slices shown]
[im 23/45  brain]
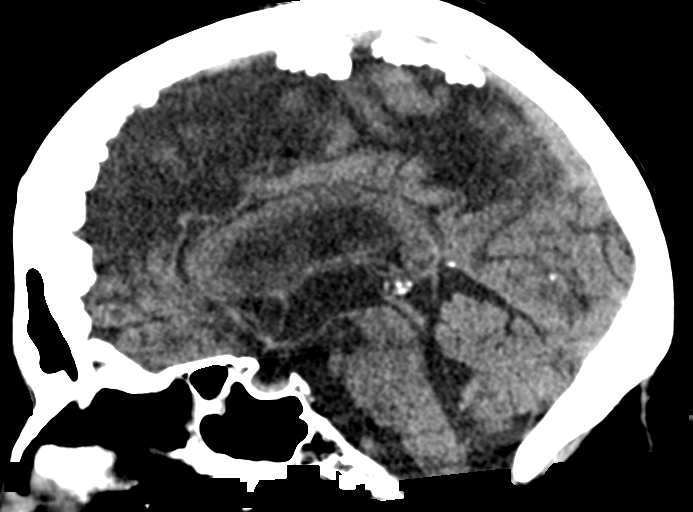

[Series 8: c spine soft · axial · 0.33mm/px · z∈[+139,+201]mm · 5 of 71 slices shown]
[im 8/71  brain]
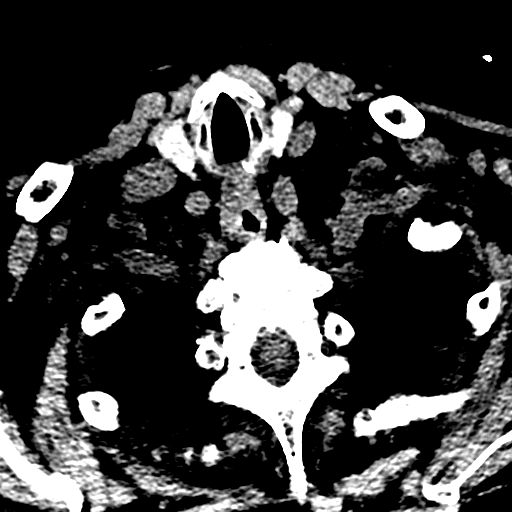
[im 15/71  brain]
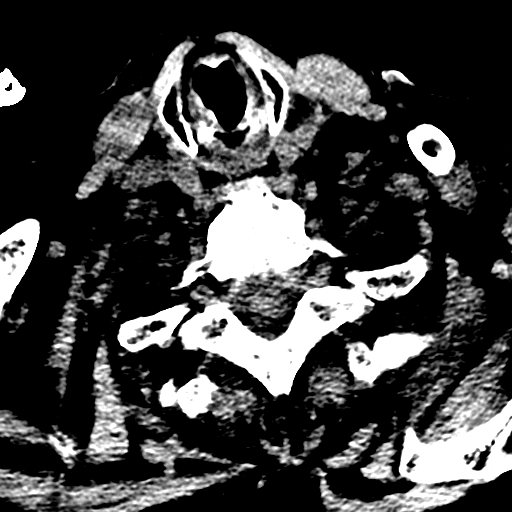
[im 22/71  brain]
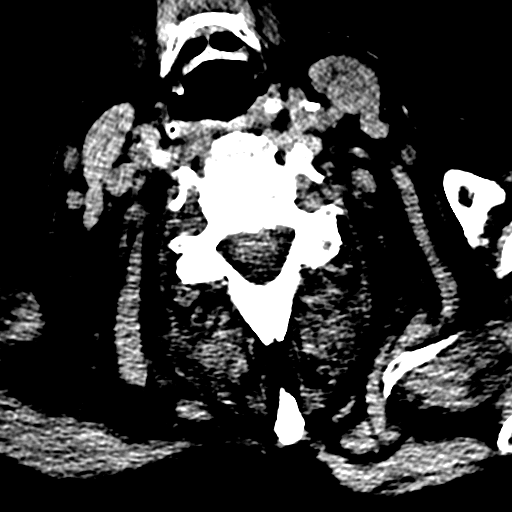
[im 32/71  brain]
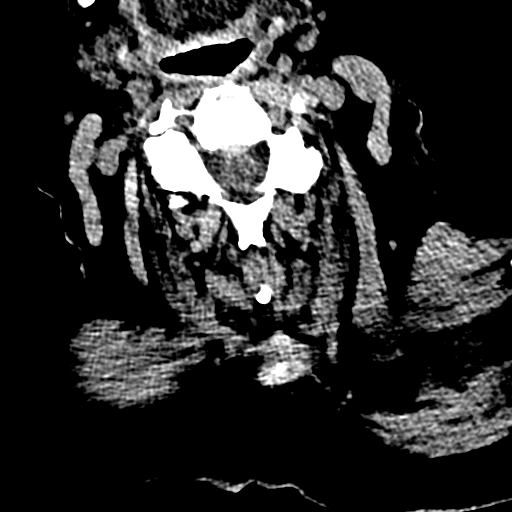
[im 39/71  brain]
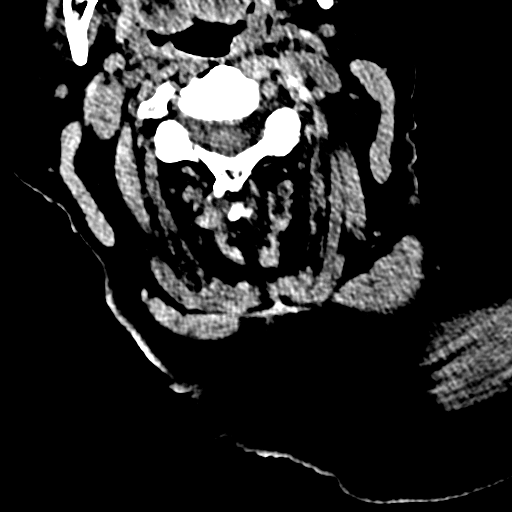

[16 of 47 positions shown; findings below may reference images not displayed]

FINDINGS: CT HEAD FINDINGS

BRAIN: Moderate to severe ventriculomegaly with disproportionate
temporal lobe volume loss. No intraparenchymal hemorrhage, mass
effect nor midline shift. Patchy supratentorial white matter
hypodensities less than expected for patient's age, though
non-specific are most compatible with chronic small vessel ischemic
disease. No acute large vascular territory infarcts. No abnormal
extra-axial fluid collections. Basal cisterns are patent.

VASCULAR: Moderate calcific atherosclerosis of the carotid siphons.

SKULL: No skull fracture. No significant scalp soft tissue swelling.

SINUSES/ORBITS: Mild paranasal sinus mucosal thickening. Mastoid air
cells are well aerated. The included ocular globes and orbital
contents are non-suspicious.

OTHER: None.

CT CERVICAL SPINE FINDINGS

ALIGNMENT: Maintained lordosis. Vertebral bodies in alignment.

SKULL BASE AND VERTEBRAE: Cervical vertebral bodies and posterior
elements are intact. Intervertebral disc heights preserved. No
destructive bony lesions. C1-2 articulation maintained, severe
osteoarthrosis. Moderate RIGHT, mild LEFT multilevel facet
arthropathy.

SOFT TISSUES AND SPINAL CANAL: Normal.

DISC LEVELS: No significant osseous canal stenosis or neural
foraminal narrowing.

UPPER CHEST: Lung apices are clear.

OTHER: None.
IMPRESSION: CT HEAD: No acute intracranial process.

Stable moderate to severe brain atrophy and mild chronic small
vessel ischemic disease.

CT CERVICAL SPINE: No acute fracture or malalignment. No advanced
degenerative change for age.

## 2018-07-01 ENCOUNTER — Encounter: Payer: Self-pay | Admitting: Internal Medicine

## 2018-07-01 ENCOUNTER — Non-Acute Institutional Stay (SKILLED_NURSING_FACILITY): Payer: Medicare Other | Admitting: Internal Medicine

## 2018-07-01 DIAGNOSIS — D631 Anemia in chronic kidney disease: Secondary | ICD-10-CM

## 2018-07-01 DIAGNOSIS — F0391 Unspecified dementia with behavioral disturbance: Secondary | ICD-10-CM | POA: Diagnosis not present

## 2018-07-01 DIAGNOSIS — N184 Chronic kidney disease, stage 4 (severe): Secondary | ICD-10-CM | POA: Diagnosis not present

## 2018-07-01 DIAGNOSIS — I10 Essential (primary) hypertension: Secondary | ICD-10-CM

## 2018-07-01 NOTE — Progress Notes (Signed)
Location:    [facility] Room Number: 106/D Place of Service:  SNF [phone]) Provider:  Veleta Miners MD  Virgie Dad, MD  Patient Care Team: Virgie Dad, MD as PCP - General (Internal Medicine) Virgie Dad, MD as Consulting Physician (Geriatric Medicine) Rolm Baptise as Physician Assistant (Internal Medicine)  Extended Emergency Contact Information Primary Emergency Contact: Reniyah, [place], [place] [address] Johnnette Litter of [place] Phone: [phone] Mobile Phone: [phone] Relation: Daughter Secondary Emergency Contact: Sheretha, [place], [place] [address] [place] of [place] Phone: [phone] Relation: Son  Code Status:  DNR Goals of care: Advanced Directive information Advanced Directives 07/01/2018  Does Patient Have a Medical Advance Directive? Yes  Type of Advance Directive Out of facility DNR (pink MOST or yellow form)  Does patient want to make changes to medical advance directive? No - Patient declined  Copy of [place] in Chart? No - copy requested  Pre-existing out of facility DNR order (yellow form or pink MOST form) -     Chief Complaint  Patient presents with  . Medical Management of Chronic Issues    Routine visit of medical management    HPI:  [name] seen today for medical management of chronic diseases.    Patienthas h/o Dementia with Behavior problems, Hypertension, CKD Stage 4, Anemia And h/O Femur fracture Patient is a long-term resident of facility.  She does not have any acute issues.  No new nursing issues.  She continues to be on low-dose of Resperidal for for behavior issues. Her weight is slightly up to 194 lbs. She has good appetite Her Daughter comes everyday to help feed her. Patient unable to give any history due to her dementia  Past Medical History:  Diagnosis Date  . Alzheimer disease ([place])   . Anemia   . Anxiety   . Arthritis     . Hyperlipemia   . Hypertension   . Kidney calculus   . Pneumonia    Past Surgical History:  Procedure Laterality Date  . CHOLECYSTECTOMY    . HIP ARTHROPLASTY Left 11/18/2013   Procedure: ARTHROPLASTY BIPOLAR HIP;  Surgeon: Sanjuana Kava, MD;  Location: [facility];  Service: Orthopedics;  Laterality: Left;    Allergies  Allergen Reactions  . Sulfa Antibiotics Rash    Outpatient Encounter Medications as of 07/01/2018  Medication Sig  . aspirin EC 81 MG tablet Take 81 mg by mouth daily.  . calcium-vitamin D (OSCAL WITH D) 500-200 MG-UNIT tablet Take 1 tablet by mouth 2 (two) times daily.  . clotrimazole-betamethasone (LOTRISONE) cream Apply 1 application topically daily as needed.  Marland Kitchen Dextromethorphan-Guaifenesin (ROBITUSSIN COUGH+CHEST CONG DM) 5-100 MG/5ML LIQD Take 10 mLs by mouth every 6 (six) hours as needed.   Marland Kitchen ketoconazole (NIZORAL) 2 % shampoo Apply 1 application topically 2 (two) times a week. Tues., Thurs.  . risperiDONE (RISPERDAL) 0.25 MG tablet Take 0.25 mg by mouth daily.  . risperiDONE (RISPERDAL) 1 MG/ML oral solution Take 0.5 mg by mouth at bedtime.    No facility-administered encounter medications on file as of 07/01/2018.      Review of Systems  Unable to perform ROS: Dementia    Immunization History  Administered Date(s) Administered  . Influenza-Unspecified 06/08/2014, 06/03/2016, 06/05/2017  . Pneumococcal Conjugate-13 10/30/2015  . Pneumococcal-Unspecified 06/10/2016  . Tdap 05/22/2017   Pertinent  Health Maintenance Due  Topic Date Due  . INFLUENZA VACCINE  Completed  . DEXA SCAN  Completed  . PNA vac Low Risk Adult  Completed   Fall Risk  06/01/2018 05/21/2017  Falls in the past year? No Yes  Number falls in past yr: - 1  Injury with Fall? - Yes   Functional Status Survey:    Vitals:   07/01/18 1133  BP: (!) 149/86  Pulse: 79  Resp: 19  Temp: 97.9 F (36.6 C)  TempSrc: Oral  SpO2: 100%  Weight: 194 lb 12.8 oz (88.4 kg)  Height: 5'  1" (1.549 m)   Body mass index is 36.81 kg/m. Physical Exam  Constitutional: She appears well-developed and well-nourished.  HENT:  Head: Normocephalic.  Mouth/Throat: Oropharynx is clear and moist.  Eyes: Pupils are equal, round, and reactive to light.  Neck: Neck supple.  Cardiovascular: Normal rate.  Murmur heard. Pulmonary/Chest: Effort normal and breath sounds normal. No respiratory distress. She has no wheezes. She has no rales.  Abdominal: Soft. Bowel sounds are normal. She exhibits no distension. There is no tenderness. There is no rebound.  Musculoskeletal: She exhibits edema.  Neurological: She is alert.  Does not follow any commands. Stays in the Wheelchair and is not Ambulatory. Complete dependent for her ADL Does get aggressive with staff sometimes.  Skin: Skin is warm and dry.  Psychiatric: She has a normal mood and affect. Her behavior is normal.    Labs reviewed: Recent Labs    08/27/17 0900 11/06/17 0700 01/29/18 0747  NA 141 141 140  K 4.2 3.7 3.7  CL 106 107 110  CO2 24 22 23   GLUCOSE 104* 89 96  BUN 36* 45* 39*  CREATININE 1.81* 1.93* 1.83*  CALCIUM 9.1 8.7* 8.9   Recent Labs    08/27/17 0900  AST 15  ALT 31  ALKPHOS 85  BILITOT 0.4  PROT 7.4  ALBUMIN 3.5   Recent Labs    08/27/17 0900 11/06/17 0700 01/29/18 0747  WBC 9.7 6.6 7.6  NEUTROABS 5.9 3.5 4.4  HGB 12.0 10.7* 11.1*  HCT 39.0 35.3* 35.5*  MCV 89.7 90.7 86.8  PLT 320 243 228   Lab Results  Component Value Date   TSH 1.708 12/08/2016   Lab Results  Component Value Date   HGBA1C 5.2 07/14/2016   Lab Results  Component Value Date   CHOL 166 12/27/2015   HDL 44 12/27/2015   LDLCALC 114 (H) 12/27/2015   TRIG 39 12/27/2015   CHOLHDL 3.8 12/27/2015    Significant Diagnostic Results in last 30 days:  No results found.  Assessment/Plan Essential hypertension Patient's blood pressure usually stays around 140 Not on any medicines Syncope  Patient has a history of  syncope which was never worked up because due to her age No new episodes recently Dementia with behavioral disturbances Needs 24-hour supportive care Continue on low-dose of Risperdal CKD stage IV Creatinine stable Repeat BMP Anemia due to CKD Hemoglobin stable Repeat CBC   Family/ staff Communication:   Labs/tests ordered:  CMP,CBC Total time spent in this patient care encounter was 25_ minutes; greater than 50% of the visit spent counseling patient, reviewing records , Labs and coordinating care for problems addressed at this encounter.

## 2018-07-02 ENCOUNTER — Encounter (HOSPITAL_COMMUNITY)
Admission: RE | Admit: 2018-07-02 | Discharge: 2018-07-02 | Disposition: A | Discharge status: Home / Self Care | Payer: Medicare Other | Source: Skilled Nursing Facility | Attending: Internal Medicine | Admitting: Internal Medicine

## 2018-07-02 DIAGNOSIS — I129 Hypertensive chronic kidney disease with stage 1 through stage 4 chronic kidney disease, or unspecified chronic kidney disease: Secondary | ICD-10-CM | POA: Diagnosis not present

## 2018-07-02 DIAGNOSIS — N184 Chronic kidney disease, stage 4 (severe): Secondary | ICD-10-CM | POA: Diagnosis present

## 2018-07-02 DIAGNOSIS — F29 Unspecified psychosis not due to a substance or known physiological condition: Secondary | ICD-10-CM | POA: Diagnosis not present

## 2018-07-02 DIAGNOSIS — F0391 Unspecified dementia with behavioral disturbance: Secondary | ICD-10-CM | POA: Insufficient documentation

## 2018-07-02 LAB — CBC WITH DIFFERENTIAL/PLATELET
ABS IMMATURE GRANULOCYTES: 0.03 10*3/uL (ref 0.00–0.07)
BASOS PCT: 1 %
Basophils Absolute: 0.1 10*3/uL (ref 0.0–0.1)
EOS ABS: 0.4 10*3/uL (ref 0.0–0.5)
Eosinophils Relative: 5 %
HCT: 35.7 % — ABNORMAL LOW (ref 36.0–46.0)
Hemoglobin: 10.7 g/dL — ABNORMAL LOW (ref 12.0–15.0)
Immature Granulocytes: 0 %
Lymphocytes Relative: 33 %
Lymphs Abs: 2.8 10*3/uL (ref 0.7–4.0)
MCH: 26.8 pg (ref 26.0–34.0)
MCHC: 30 g/dL (ref 30.0–36.0)
MCV: 89.3 fL (ref 80.0–100.0)
MONOS PCT: 7 %
Monocytes Absolute: 0.6 10*3/uL (ref 0.1–1.0)
Neutro Abs: 4.8 10*3/uL (ref 1.7–7.7)
Neutrophils Relative %: 54 %
PLATELETS: 235 10*3/uL (ref 150–400)
RBC: 4 MIL/uL (ref 3.87–5.11)
RDW: 13.4 % (ref 11.5–15.5)
WBC: 8.7 10*3/uL (ref 4.0–10.5)
nRBC: 0 % (ref 0.0–0.2)

## 2018-07-02 LAB — COMPREHENSIVE METABOLIC PANEL
ALT: 10 U/L (ref 0–44)
AST: 11 U/L — ABNORMAL LOW (ref 15–41)
Albumin: 2.9 g/dL — ABNORMAL LOW (ref 3.5–5.0)
Alkaline Phosphatase: 83 U/L (ref 38–126)
Anion gap: 8 (ref 5–15)
BILIRUBIN TOTAL: 0.5 mg/dL (ref 0.3–1.2)
BUN: 34 mg/dL — ABNORMAL HIGH (ref 8–23)
CHLORIDE: 110 mmol/L (ref 98–111)
CO2: 24 mmol/L (ref 22–32)
CREATININE: 1.93 mg/dL — AB (ref 0.44–1.00)
Calcium: 8.7 mg/dL — ABNORMAL LOW (ref 8.9–10.3)
GFR, EST AFRICAN AMERICAN: 25 mL/min — AB (ref >60)
GFR, EST NON AFRICAN AMERICAN: 21 mL/min — AB (ref >60)
Glucose, Bld: 93 mg/dL (ref 70–99)
Potassium: 3.4 mmol/L — ABNORMAL LOW (ref 3.5–5.1)
Sodium: 142 mmol/L (ref 135–145)
TOTAL PROTEIN: 6.4 g/dL — AB (ref 6.5–8.1)

## 2018-07-23 ENCOUNTER — Encounter: Payer: Self-pay | Admitting: Internal Medicine

## 2018-07-23 ENCOUNTER — Non-Acute Institutional Stay (SKILLED_NURSING_FACILITY): Payer: Medicare Other | Admitting: Internal Medicine

## 2018-07-23 DIAGNOSIS — J309 Allergic rhinitis, unspecified: Secondary | ICD-10-CM

## 2018-07-23 NOTE — Progress Notes (Signed)
Location:    [facility] Room Number: 106/D Place of Service:  SNF [phone]) Provider:  Freddi Starr, MD  Patient Care Team: Virgie Dad, MD as PCP - General (Internal Medicine) Virgie Dad, MD as Consulting Physician (Geriatric Medicine) Rolm Baptise as Physician Assistant (Internal Medicine)  Extended Emergency Contact Information Primary Emergency Contact: Tashea, [place], [place] [address] Johnnette Litter of [place] Phone: [phone] Mobile Phone: [phone] Relation: Daughter Secondary Emergency Contact: Smera, [place], [place] [address] [place] of [place] Phone: [phone] Relation: Son  Code Status:  DNR Goals of care: Advanced Directive information Advanced Directives 07/23/2018  Does Patient Have a Medical Advance Directive? Yes  Type of Advance Directive Out of facility DNR (pink MOST or yellow form)  Does patient want to make changes to medical advance directive? No - Patient declined  Copy of [place] in Chart? No - copy requested  Pre-existing out of facility DNR order (yellow form or pink MOST form) -     Chief Complaint  Patient presents with  . Acute Visit    Sneezing    HPI:  [name] seen today for an acute visit for sneezing and possible eyes and nose drainage.  Patient is a long-term resident of facility with severe dementia as well as hypertension chronic kidney disease stage IV anemia and a history of femur fracture in the past.  Apparently she has been having some increased sneezing and possible some high and nose drainage today.  Her vital signs been stable she is afebrile   Past Medical History:  Diagnosis Date  . Alzheimer disease ([place])   . Anemia   . Anxiety   . Arthritis   . Hyperlipemia   . Hypertension   . Kidney calculus   . Pneumonia    Past Surgical History:  Procedure Laterality Date  . CHOLECYSTECTOMY    . HIP  ARTHROPLASTY Left 11/18/2013   Procedure: ARTHROPLASTY BIPOLAR HIP;  Surgeon: Sanjuana Kava, MD;  Location: [facility];  Service: Orthopedics;  Laterality: Left;    Allergies  Allergen Reactions  . Sulfa Antibiotics Rash    Outpatient Encounter Medications as of 07/23/2018  Medication Sig  . aspirin EC 81 MG tablet Take 81 mg by mouth daily.  . calcium-vitamin D (OSCAL WITH D) 500-200 MG-UNIT tablet Take 1 tablet by mouth 2 (two) times daily.  . clotrimazole-betamethasone (LOTRISONE) cream Apply 1 application topically daily as needed.  Marland Kitchen ketoconazole (NIZORAL) 2 % shampoo Apply 1 application topically 2 (two) times a week. Tues., Thurs.  Marland Kitchen loratadine (CLARITIN) 10 MG tablet Take 10 mg by mouth daily.  . risperiDONE (RISPERDAL) 0.25 MG tablet Take 0.25 mg by mouth daily.  . risperiDONE (RISPERDAL) 1 MG/ML oral solution Take 0.5 mg by mouth at bedtime.   . [DISCONTINUED] Dextromethorphan-Guaifenesin (ROBITUSSIN COUGH+CHEST CONG DM) 5-100 MG/5ML LIQD Take 10 mLs by mouth every 6 (six) hours as needed.    No facility-administered encounter medications on file as of 07/23/2018.     Review of Systems   Unobtainable secondary to dementia per nursing there is been no sign of distress or labored breathing  Immunization History  Administered Date(s) Administered  . Influenza-Unspecified 06/08/2014, 06/03/2016, 06/05/2017  . Pneumococcal Conjugate-13 10/30/2015  . Pneumococcal-Unspecified 06/10/2016  . Tdap 05/22/2017   Pertinent  Health Maintenance Due  Topic Date Due  . INFLUENZA VACCINE  Completed  . DEXA SCAN  Completed  . PNA vac Low Risk Adult  Completed   Fall Risk  06/01/2018 05/21/2017  Falls in the past year? No Yes  Number falls in past yr: - 1  Injury with Fall? - Yes   Functional Status Survey:    Temperature is 97.0 pulse 68 respirations 19 blood pressure 134/60 oxygen saturation is 90% on room air Physical Exam   In general this is a well-nourished elderly female in  no distress sitting comfortably in her wheelchair.  Her skin is warm and dry she is not diaphoretic.  Eyes possibly a small amount of watery drainage she has prescription lenses sclera and conjunctive are clear visual acuity appears to be intact.    Nose- possibly small amount of clear drainage is present  Oropharynx was difficult to assess patient did not really open her mouth she does not follow verbal commands.  Chest could not appreciate any labored breathing or overt congestion again air entry was shallow because she does not follow verbal commands.  Abdomen is soft nontender with positive bowel sounds.  Heart is regular rate and rhythm without murmur gallop or rub she has chronic venous stasis edema at baseline.  Musculoskeletal moves her upper extremities at baseline has lower extremity baseline weakness.  Neurologic she is alert.  Psych findings consistent with severe dementia she does not follow verbal commands Labs reviewed: Recent Labs    11/06/17 0700 01/29/18 0747 07/02/18 0715  NA 141 140 142  K 3.7 3.7 3.4*  CL 107 110 110  CO2 22 23 24   GLUCOSE 89 96 93  BUN 45* 39* 34*  CREATININE 1.93* 1.83* 1.93*  CALCIUM 8.7* 8.9 8.7*   Recent Labs    08/27/17 0900 07/02/18 0715  AST 15 11*  ALT 31 10  ALKPHOS 85 83  BILITOT 0.4 0.5  PROT 7.4 6.4*  ALBUMIN 3.5 2.9*   Recent Labs    11/06/17 0700 01/29/18 0747 07/02/18 0715  WBC 6.6 7.6 8.7  NEUTROABS 3.5 4.4 4.8  HGB 10.7* 11.1* 10.7*  HCT 35.3* 35.5* 35.7*  MCV 90.7 86.8 89.3  PLT 243 228 235   Lab Results  Component Value Date   TSH 1.708 12/08/2016   Lab Results  Component Value Date   HGBA1C 5.2 07/14/2016   Lab Results  Component Value Date   CHOL 166 12/27/2015   HDL 44 12/27/2015   LDLCALC 114 (H) 12/27/2015   TRIG 39 12/27/2015   CHOLHDL 3.8 12/27/2015    Significant Diagnostic Results in last 30 days:  No results found.  Assessment/Plan  #1- sneezing with possibly small  amount of eyes and nose drainage that is clear-one would be suspicious of allergy-we will treat with a short course Claritin 10 mg a day for 5 days and monitor also vital signs twice daily for 48 hours as well.  [id number]

## 2018-07-31 DIAGNOSIS — F0391 Unspecified dementia with behavioral disturbance: Secondary | ICD-10-CM | POA: Diagnosis not present

## 2018-07-31 DIAGNOSIS — F411 Generalized anxiety disorder: Secondary | ICD-10-CM | POA: Diagnosis not present

## 2018-08-09 ENCOUNTER — Encounter: Payer: Self-pay | Admitting: Internal Medicine

## 2018-08-09 ENCOUNTER — Non-Acute Institutional Stay (SKILLED_NURSING_FACILITY): Payer: Medicare Other | Admitting: Internal Medicine

## 2018-08-09 DIAGNOSIS — F0391 Unspecified dementia with behavioral disturbance: Secondary | ICD-10-CM | POA: Diagnosis not present

## 2018-08-09 DIAGNOSIS — R55 Syncope and collapse: Secondary | ICD-10-CM

## 2018-08-09 DIAGNOSIS — N184 Chronic kidney disease, stage 4 (severe): Secondary | ICD-10-CM

## 2018-08-09 DIAGNOSIS — I10 Essential (primary) hypertension: Secondary | ICD-10-CM | POA: Diagnosis not present

## 2018-08-09 DIAGNOSIS — D631 Anemia in chronic kidney disease: Secondary | ICD-10-CM

## 2018-08-09 NOTE — Progress Notes (Signed)
Location:    [facility] Room Number: 106/D Place of Service:  SNF [phone]) Provider: [place]ville Lewis PA-C  Virgie Dad, MD  Patient Care Team: Virgie Dad, MD as PCP - General (Internal Medicine) Virgie Dad, MD as Consulting Physician (Geriatric Medicine) Rolm Baptise as Physician Assistant (Internal Medicine)  Extended Emergency Contact Information Primary Emergency Contact: Sahiba, [place], [place] [address] Johnnette Litter of [place] Phone: [phone] Mobile Phone: [phone] Relation: Daughter Secondary Emergency Contact: Benicia, [place], [place] [address] [place] of [place] Phone: [phone] Relation: Son  Code Status:  DNR Goals of care: Advanced Directive information Advanced Directives 08/09/2018  Does Patient Have a Medical Advance Directive? Yes  Type of Advance Directive Out of facility DNR (pink MOST or yellow form)  Does patient want to make changes to medical advance directive? No - Patient declined  Copy of [place] in Chart? No - copy requested  Pre-existing out of facility DNR order (yellow form or pink MOST form) -     Chief Complaint  Patient presents with  . Medical Management of Chronic Issues    Routine visit of medical management   Medical management of chronic medical issues including dementia with behaviors-hypertension-chronic kidney disease stage IV as well as anemia of chronic disease- and allergic rhinitis  HPI:  [name] seen today for medical management of chronic diseases.  As noted above.  She continues to be quite stable at 1 time her behaviors were an issue but she has responded very well to the low-dose Risperdal twice daily.  I saw her most recently for allergy symptoms but she appears to have responded well to a short course of Claritin.  Her weight appears to be relatively stable she is actually gained about 4 pounds she has very good  support from her daughter who is often here feeding her-.  Regards to hypertension appears recent blood pressures have ranged systolically from the 154M to 150s I suspect at times elevations are caused with agitation with invasive maneuvers I do not see consistent elevations.  She also has a history of chronic kidney disease which is been relatively stable with a creatinine of one 1.93 on the lab done approximately 6 weeks ago-I do note her potassium was minimally low at 3.4 but would be hesitant to supplement this with her history of renal disease will update this just to make sure things are stable.  She also has a history of anemia I suspect with an element of chronic disease hemoglobin has shown stability it was 10.7 on the lab done last month.  She also has a history of a femur fracture but is she is nonambulatory and appears to be doing well without pain complaints.-She is on calcium with vitamin D She also has a history of syncope with unresponsive episodes--her daughter does not really want any aggressive work-up of this and this actually is been stable now for a considerable amount of time-usually when these episodes occur she is unresponsive and then quickly becomes responsive again returns to her baseline fairly quickly.     Past Medical History:  Diagnosis Date  . Alzheimer disease ([place])   . Anemia   . Anxiety   . Arthritis   . Hyperlipemia   . Hypertension   . Kidney calculus   . Pneumonia  Past Surgical History:  Procedure Laterality Date  . CHOLECYSTECTOMY    . HIP ARTHROPLASTY Left 11/18/2013   Procedure: ARTHROPLASTY BIPOLAR HIP;  Surgeon: Sanjuana Kava, MD;  Location: [facility];  Service: Orthopedics;  Laterality: Left;    Allergies  Allergen Reactions  . Sulfa Antibiotics Rash    Outpatient Encounter Medications as of 08/09/2018  Medication Sig  . aspirin EC 81 MG tablet Take 81 mg by mouth daily.  . calcium-vitamin D (OSCAL WITH D) 500-200 MG-UNIT tablet Take  1 tablet by mouth 2 (two) times daily.  . clotrimazole-betamethasone (LOTRISONE) cream Apply 1 application topically daily as needed.  Marland Kitchen ketoconazole (NIZORAL) 2 % shampoo Apply 1 application topically 2 (two) times a week. Tues., Thurs.  . risperiDONE (RISPERDAL) 0.25 MG tablet Take 0.25 mg by mouth daily.  . risperiDONE (RISPERDAL) 1 MG/ML oral solution Take 0.5 mg by mouth at bedtime.   . [DISCONTINUED] loratadine (CLARITIN) 10 MG tablet Take 10 mg by mouth daily.   No facility-administered encounter medications on file as of 08/09/2018.      Review of Systems   Is unobtainable secondary to dementia  Immunization History  Administered Date(s) Administered  . Influenza-Unspecified 06/08/2014, 06/03/2016, 06/05/2017  . Pneumococcal Conjugate-13 10/30/2015  . Pneumococcal-Unspecified 06/10/2016  . Tdap 05/22/2017   Pertinent  Health Maintenance Due  Topic Date Due  . INFLUENZA VACCINE  Completed  . DEXA SCAN  Completed  . PNA vac Low Risk Adult  Completed   Fall Risk  06/01/2018 05/21/2017  Falls in the past year? No Yes  Number falls in past yr: - 1  Injury with Fall? - Yes   Functional Status Survey:    Vitals:   08/09/18 1508  BP: 128/69  Pulse: 91  Resp: 19  Temp: 97.7 F (36.5 C)  TempSrc: Oral  SpO2: 100%  Weight: 198 lb (89.8 kg)  Height: 5\' 1"  (1.549 m)   Body mass index is 37.41 kg/m. Physical Exam  In general this is a well-developed elderly female in no distress sitting comfortably in a wheelchair.   Her skin is warm and dry on the left side of her nose she does appear to have a small water-filled blister does not appear to be infected she also has numerous seborrheic keratosis.  Eyes she has prescription lenses sclera and conjunctive are clear visual acuity appears to be intact she does make eye contact.  Oropharynx is clear mucous membranes moist.  Chest is clear to auscultation with poor respiratory effort she does not follow commands but could  not appreciate any labored breathing congestion.  Heart is somewhat distant heart sounds regular rate and rhythm chronic venous stasis edema she does have some history of murmur but could not really appreciate that today since heart sounds were somewhat distant and she was verbalizing during exam.  Abdomen is soft nontender with positive bowel sounds.  Musculoskeletal again largely wheelchair-bound but does have good upper extremity strength with baseline lower extremity weakness I do not see any changes from baseline.  Neurologic as noted above she is alert she does make eye contact cannot really be she lateralizing findings.  Psych findings consistent severe dementia she does not follow verbal commands but is not overtly agitated with exam today  Labs reviewed: Recent Labs    11/06/17 0700 01/29/18 0747 07/02/18 0715  NA 141 140 142  K 3.7 3.7 3.4*  CL 107 110 110  CO2 22 23 24   GLUCOSE 89 96 93  BUN 45*  39* 34*  CREATININE 1.93* 1.83* 1.93*  CALCIUM 8.7* 8.9 8.7*   Recent Labs    08/27/17 0900 07/02/18 0715  AST 15 11*  ALT 31 10  ALKPHOS 85 83  BILITOT 0.4 0.5  PROT 7.4 6.4*  ALBUMIN 3.5 2.9*   Recent Labs    11/06/17 0700 01/29/18 0747 07/02/18 0715  WBC 6.6 7.6 8.7  NEUTROABS 3.5 4.4 4.8  HGB 10.7* 11.1* 10.7*  HCT 35.3* 35.5* 35.7*  MCV 90.7 86.8 89.3  PLT 243 228 235   Lab Results  Component Value Date   TSH 1.708 12/08/2016   Lab Results  Component Value Date   HGBA1C 5.2 07/14/2016   Lab Results  Component Value Date   CHOL 166 12/27/2015   HDL 44 12/27/2015   LDLCALC 114 (H) 12/27/2015   TRIG 39 12/27/2015   CHOLHDL 3.8 12/27/2015    Significant Diagnostic Results in last 30 days:  No results found.  Assessment/Plan  #1 history of dementia with behaviors this appears quite stable on Risperdal twice daily nursing does not report any issues she continues to have a good appetite and continues to gain a small amount of weight- she  continues to receive extensive support from her daughter.  2.  Hypertension this appears relatively stable I suspect occasional spikes are more agitation related she is not currently on any medication.  3.  History of chronic kidney disease creatinine has shown stability at about 1.9-will update this for updated values since her potassium was minimally low on last lab.  4.-  History of anemia with element of chronic disease this has shown stability with a hemoglobin of 10.7 on lab done last month will monitor periodically.  5.  History of allergic rhinitis this appears to have been transitory she did respond to a course of Claritin.  6.  History of femur fracture she is on calcium and vitamin D she is largely nonambulatory has not really complain of pain this has not really been an issue here in some time.  7.  History of syncopal episodes as noted above she has not had one now and a considerable amount of time again these are usually transitory she quickly returns to baseline family does not wish aggressive work-up.  8.  History of nose blister this appears to be w benign at this point continue to monitor for any sign of infection  And will update a BMP next lab day or when patient will allow---at  times attaining labs is somewhat difficult because of agitation  [phone]

## 2018-08-10 ENCOUNTER — Encounter (HOSPITAL_COMMUNITY)
Admission: RE | Admit: 2018-08-10 | Discharge: 2018-08-10 | Disposition: A | Discharge status: Home / Self Care | Payer: Medicare Other | Source: Skilled Nursing Facility | Attending: Internal Medicine | Admitting: Internal Medicine

## 2018-08-10 DIAGNOSIS — N184 Chronic kidney disease, stage 4 (severe): Secondary | ICD-10-CM | POA: Diagnosis present

## 2018-08-10 DIAGNOSIS — F29 Unspecified psychosis not due to a substance or known physiological condition: Secondary | ICD-10-CM | POA: Diagnosis present

## 2018-08-10 DIAGNOSIS — I129 Hypertensive chronic kidney disease with stage 1 through stage 4 chronic kidney disease, or unspecified chronic kidney disease: Secondary | ICD-10-CM | POA: Insufficient documentation

## 2018-08-10 DIAGNOSIS — F0391 Unspecified dementia with behavioral disturbance: Secondary | ICD-10-CM | POA: Diagnosis present

## 2018-08-10 LAB — BASIC METABOLIC PANEL
ANION GAP: 10 (ref 5–15)
BUN: 39 mg/dL — ABNORMAL HIGH (ref 8–23)
CHLORIDE: 110 mmol/L (ref 98–111)
CO2: 22 mmol/L (ref 22–32)
CREATININE: 1.9 mg/dL — AB (ref 0.44–1.00)
Calcium: 8.9 mg/dL (ref 8.9–10.3)
GFR calc non Af Amer: 22 mL/min — ABNORMAL LOW (ref >60)
GFR, EST AFRICAN AMERICAN: 26 mL/min — AB (ref >60)
GLUCOSE: 90 mg/dL (ref 70–99)
Potassium: 3.9 mmol/L (ref 3.5–5.1)
Sodium: 142 mmol/L (ref 135–145)

## 2018-09-12 DIAGNOSIS — I509 Heart failure, unspecified: Secondary | ICD-10-CM | POA: Diagnosis not present

## 2018-09-12 DIAGNOSIS — I517 Cardiomegaly: Secondary | ICD-10-CM | POA: Diagnosis not present

## 2018-09-13 ENCOUNTER — Non-Acute Institutional Stay (SKILLED_NURSING_FACILITY): Payer: Medicare Other | Admitting: Internal Medicine

## 2018-09-13 ENCOUNTER — Encounter: Payer: Self-pay | Admitting: Internal Medicine

## 2018-09-13 DIAGNOSIS — I5030 Unspecified diastolic (congestive) heart failure: Secondary | ICD-10-CM

## 2018-09-13 DIAGNOSIS — J40 Bronchitis, not specified as acute or chronic: Secondary | ICD-10-CM | POA: Diagnosis not present

## 2018-09-13 DIAGNOSIS — N184 Chronic kidney disease, stage 4 (severe): Secondary | ICD-10-CM

## 2018-09-13 NOTE — Progress Notes (Signed)
Location:    [facility] Room Number: 106/D Place of Service:  SNF [phone]) Provider:  Freddi Starr, MD  Patient Care Team: Virgie Dad, MD as PCP - General (Internal Medicine) Virgie Dad, MD as Consulting Physician (Geriatric Medicine) Rolm Baptise as Physician Assistant (Internal Medicine)  Extended Emergency Contact Information Primary Emergency Contact: Ciani, [place],  [address] [address] of [place] Phone: [phone] Mobile Phone: [phone] Relation: Daughter Secondary Emergency Contact: Lashawndra, [place],  [address] [place] of [place] Phone: [phone] Relation: Son  Code Status:  DNR Goals of care: Advanced Directive information Advanced Directives 09/13/2018  Does Patient Have a Medical Advance Directive? Yes  Type of Advance Directive Out of facility DNR (pink MOST or yellow form)  Does patient want to make changes to medical advance directive? No - Patient declined  Copy of [place] in Chart? No - copy requested  Pre-existing out of facility DNR order (yellow form or pink MOST form) -     Chief Complaint  Patient presents with  . Acute Visit    Cough    With chest x-ray showing interstitial edema with pulmonary vascular redistribution HPI:  Pt is a 85 y.o. female seen today for an acute visit for follow-up of a cough- with suspected bronchitis-.  Patient is a long-term resident of the facility with a history of severe dementia as well as hypertension chronic kidney disease stage IV as well as anemia of chronic disease and allergic rhinitis.  She apparently developed a cough this weekend and possibly some chest congestion and was started empirically on Levaquin for bronchitis as well as Robitussin- she continues on these.  Chest x-ray was ordered which showed interstitial edema pulmonary vascular redistribution and parabronchial cuffing.  I do  not see that she has had a previous echo done-but one would be suspicious of some diastolic CHF.  Currently she appears to be stable O2 saturation is 97% on room air she is actually eating lunch being fed by her daughter who is very attentive.  Per discussion with daughter will start a diuretic but no aggressive work-up including a cardiac echo secondary to patient's severe dementia and agitation with invasive maneuvers   Past Medical History:  Diagnosis Date  . Alzheimer disease ([place])   . Anemia   . Anxiety   . Arthritis   . Hyperlipemia   . Hypertension   . Kidney calculus   . Pneumonia    Past Surgical History:  Procedure Laterality Date  . CHOLECYSTECTOMY    . HIP ARTHROPLASTY Left 11/18/2013   Procedure: ARTHROPLASTY BIPOLAR HIP;  Surgeon: Sanjuana Kava, MD;  Location: [facility];  Service: Orthopedics;  Laterality: Left;    Allergies  Allergen Reactions  . Sulfa Antibiotics Rash    Outpatient Encounter Medications as of 09/13/2018  Medication Sig  . aspirin EC 81 MG tablet Take 81 mg by mouth daily.  . calcium-vitamin D (OSCAL WITH D) 500-200 MG-UNIT tablet Take 1 tablet by mouth 2 (two) times daily.  . clotrimazole-betamethasone (LOTRISONE) cream Apply 1 application topically daily as needed.  Marland Kitchen dextromethorphan 15 MG/5ML syrup Give 15 ml by mouth every 6 hours prn from 09/12/2018-09/14/2018.then give 15 ml by mouth every 6 hours prn starting 09/15/2018.  Marland Kitchen ketoconazole (NIZORAL) 2 % shampoo Apply 1 application topically 2 (two) times a week.  Tues., Thurs.  Marland Kitchen levofloxacin (LEVAQUIN) 250 MG tablet Take 250 mg by mouth daily.  . Probiotic Product (RISA-BID PROBIOTIC) TABS Take 1 tablet by mouth twice day  . risperiDONE (RISPERDAL) 0.25 MG tablet Take 0.25 mg by mouth daily.  . risperiDONE (RISPERDAL) 1 MG/ML oral solution Take 0.5 mg by mouth at bedtime.    No facility-administered encounter medications on file as of 09/13/2018.     Review of Systems   Unobtainable secondary  to dementia please see HPI- nursing staff assist patient has been stable today with no acute congestion or shortness of breath she appears to continue to have a good appetite  Immunization History  Administered Date(s) Administered  . Influenza-Unspecified 06/08/2014, 06/03/2016, 06/05/2017  . Pneumococcal Conjugate-13 10/30/2015  . Pneumococcal-Unspecified 06/10/2016  . Tdap 05/22/2017   Pertinent  Health Maintenance Due  Topic Date Due  . INFLUENZA VACCINE  Completed  . DEXA SCAN  Completed  . PNA vac Low Risk Adult  Completed   Fall Risk  06/01/2018 05/21/2017  Falls in the past year? No Yes  Number falls in past yr: - 1  Injury with Fall? - Yes   Functional Status Survey:    Vitals:   09/13/18 1511  BP: 140/90  Pulse: 91  Resp: 18  Temp: 97.8 F (36.6 C)  TempSrc: Oral  SpO2: 97%   100.6 pounds which appears to be a gain of about 6 pounds since October but fairly stable with last month's weight Physical Exam   In general this is a well-nourished elderly female in no distress she is actually being fed lunch by her daughter.  Her skin is warm and dry she does have baseline seborrheic keratosis.  Eyes visual acuity appears to be intact sclera and conjunctive are clear.  Oropharynx from what I could assess appeared to have mucous membranes moist- she does have some food residue on her tongue.  Chest is clear to auscultation with poor respiratory effort there is no labored breathing.  Heart distant heart sounds from what I could auscultate and palpate radially regular rate and rhythm she has baseline venous stasis edema this does not really appear to be increased from previous exams.  Abdomen is soft nontender with positive bowel sounds.  Musculoskeletal appears to move her extremities at baseline with lower extremity weakness with her edema but this is not changed from previous exams.  Neurologic she is alert she speaks nonsensically which is her baseline.  Psych  findings consistent with severe dementia         Labs reviewed: Recent Labs    01/29/18 0747 07/02/18 0715 08/10/18 0710  NA 140 142 142  K 3.7 3.4* 3.9  CL 110 110 110  CO2 23 24 22   GLUCOSE 96 93 90  BUN 39* 34* 39*  CREATININE 1.83* 1.93* 1.90*  CALCIUM 8.9 8.7* 8.9   Recent Labs    07/02/18 0715  AST 11*  ALT 10  ALKPHOS 83  BILITOT 0.5  PROT 6.4*  ALBUMIN 2.9*   Recent Labs    11/06/17 0700 01/29/18 0747 07/02/18 0715  WBC 6.6 7.6 8.7  NEUTROABS 3.5 4.4 4.8  HGB 10.7* 11.1* 10.7*  HCT 35.3* 35.5* 35.7*  MCV 90.7 86.8 89.3  PLT 243 228 235   Lab Results  Component Value Date   TSH 1.708 12/08/2016   Lab Results  Component Value Date   HGBA1C 5.2 07/14/2016   Lab Results  Component Value Date   CHOL 166 12/27/2015  HDL 44 12/27/2015   LDLCALC 114 (H) 12/27/2015   TRIG 39 12/27/2015   CHOLHDL 3.8 12/27/2015    Significant Diagnostic Results in last 30 days:  No results found.  Assessment/Plan  #1 history of cough with x-ray showing interstitial edema and pulmonary vascular redistribution.  Patient does appear to have some element of bronchitis here per nursing history coughing and previous history of bronchitis --- this may be a combination of this and some findings of edema on x-ray-.  At this point will continue the Levaquin for short course- and Robitussin routine for now and eventually PRN.  Continue to monitor for changes.  Will add Lasix 20 mg a day this is somewhat complicated with her history of chronic kidney disease.  Will attempt to obtain a basic metabolic panel tomorrow if patient will allow or at earliest time lab can draw this secondary to patient agitation.  Also will try to obtain a BMP in 1 week to keep an eye on this.  Also monitor weights q. weekly notify provider of gain greater than 3 pounds- weights greater than q. weekly I suspect would be difficult secondary to patient agitation.  2.  History of chronic  kidney disease stage IV creatinine of 1.9 BUN of 39 on lab done December 10 appears fairly baseline again will try to update this-especially since she will now be on a diuretic at least for short course.  Clinically she does not appear to be unstable she appears to be essentially at her baseline.  [id number]

## 2018-09-14 ENCOUNTER — Encounter (HOSPITAL_COMMUNITY)
Admission: RE | Admit: 2018-09-14 | Discharge: 2018-09-14 | Disposition: A | Discharge status: Home / Self Care | Payer: Medicare Other | Source: Skilled Nursing Facility | Attending: Internal Medicine | Admitting: Internal Medicine

## 2018-09-14 DIAGNOSIS — N184 Chronic kidney disease, stage 4 (severe): Secondary | ICD-10-CM | POA: Diagnosis present

## 2018-09-14 DIAGNOSIS — I129 Hypertensive chronic kidney disease with stage 1 through stage 4 chronic kidney disease, or unspecified chronic kidney disease: Secondary | ICD-10-CM | POA: Insufficient documentation

## 2018-09-14 LAB — BASIC METABOLIC PANEL
Anion gap: 7 (ref 5–15)
BUN: 41 mg/dL — AB (ref 8–23)
CALCIUM: 8.6 mg/dL — AB (ref 8.9–10.3)
CHLORIDE: 111 mmol/L (ref 98–111)
CO2: 23 mmol/L (ref 22–32)
CREATININE: 1.87 mg/dL — AB (ref 0.44–1.00)
GFR calc non Af Amer: 23 mL/min — ABNORMAL LOW (ref >60)
GFR, EST AFRICAN AMERICAN: 27 mL/min — AB (ref >60)
Glucose, Bld: 94 mg/dL (ref 70–99)
Potassium: 3.6 mmol/L (ref 3.5–5.1)
Sodium: 141 mmol/L (ref 135–145)

## 2018-09-21 ENCOUNTER — Encounter: Payer: Self-pay | Admitting: Internal Medicine

## 2018-09-21 ENCOUNTER — Non-Acute Institutional Stay (SKILLED_NURSING_FACILITY): Payer: Medicare Other | Admitting: Internal Medicine

## 2018-09-21 ENCOUNTER — Encounter (HOSPITAL_COMMUNITY)
Admission: RE | Admit: 2018-09-21 | Discharge: 2018-09-21 | Disposition: A | Discharge status: Home / Self Care | Payer: Medicare Other | Source: Skilled Nursing Facility | Attending: *Deleted | Admitting: *Deleted

## 2018-09-21 DIAGNOSIS — E876 Hypokalemia: Secondary | ICD-10-CM | POA: Diagnosis not present

## 2018-09-21 DIAGNOSIS — N184 Chronic kidney disease, stage 4 (severe): Secondary | ICD-10-CM | POA: Diagnosis not present

## 2018-09-21 DIAGNOSIS — R059 Cough, unspecified: Secondary | ICD-10-CM

## 2018-09-21 DIAGNOSIS — R05 Cough: Secondary | ICD-10-CM

## 2018-09-21 DIAGNOSIS — I129 Hypertensive chronic kidney disease with stage 1 through stage 4 chronic kidney disease, or unspecified chronic kidney disease: Secondary | ICD-10-CM | POA: Diagnosis not present

## 2018-09-21 LAB — BASIC METABOLIC PANEL
Anion gap: 9 (ref 5–15)
BUN: 35 mg/dL — ABNORMAL HIGH (ref 8–23)
CALCIUM: 8.6 mg/dL — AB (ref 8.9–10.3)
CHLORIDE: 108 mmol/L (ref 98–111)
CO2: 24 mmol/L (ref 22–32)
Creatinine, Ser: 1.89 mg/dL — ABNORMAL HIGH (ref 0.44–1.00)
GFR calc non Af Amer: 23 mL/min — ABNORMAL LOW (ref >60)
GFR, EST AFRICAN AMERICAN: 26 mL/min — AB (ref >60)
Glucose, Bld: 96 mg/dL (ref 70–99)
Potassium: 3.2 mmol/L — ABNORMAL LOW (ref 3.5–5.1)
Sodium: 141 mmol/L (ref 135–145)

## 2018-09-21 NOTE — Progress Notes (Signed)
This is an acute visit.  Level of care skilled.  Facility is [facility].  Acute visit follow-up hypokalemia.  History of present illness.  Patient is a long-term resident of facility has a history of severe dementia as well as hypertension chronic kidney disease stage IV as well as anemia of chronic disease in addition to allergic rhinitis.  Final week ago she developed a cough possibly some chest congestion and was started empirically on Levaquin for bronchitis as well as Robitussin.  Chest x-ray was ordered which actually showed interstitial edema pulmonary vascular redistribution and parabronchial cuffing.  Secondary to concern she may have a combination of bronchitis and diastolic CHF she was continued on the Levaquin which she is completed.  She was also started on low-dose Lasix.  Labs done today show potassium has dropped slightly down to 3.2 renal function appears relatively stable with a creatinine of 1.89 and a BUN of 35.  Currently she is sitting in her wheelchair comfortably her daughter is with her-apparently her respiratory status is stable without increased cough I cannot really appreciate congestion on exam this afternoon she appears to be doing well in this regard.  Edema venous stasis appears to be at baseline appears she has lost couple pounds since the beginning of the year this might be fluid related   Past Medical History:  Diagnosis Date  . Alzheimer disease ([place])   . Anemia   . Anxiety   . Arthritis   . Hyperlipemia   . Hypertension   . Kidney calculus   . Pneumonia         Past Surgical History:  Procedure Laterality Date  . CHOLECYSTECTOMY    . HIP ARTHROPLASTY Left 11/18/2013   Procedure: ARTHROPLASTY BIPOLAR HIP;  Surgeon: Sanjuana Kava, MD;  Location: [facility];  Service: Orthopedics;  Laterality: Left;        Allergies  Allergen Reactions  . Sulfa Antibiotics Rash      MEDICATIONS     Medication Sig  . aspirin  EC 81 MG tablet Take 81 mg by mouth daily.  . calcium-vitamin D (OSCAL WITH D) 500-200 MG-UNIT tablet Take 1 tablet by mouth 2 (two) times daily.  . clotrimazole-betamethasone (LOTRISONE) cream Apply 1 application topically daily as needed.  Marland Kitchen dextromethorphan 15 MG/5ML syrup Give 15 ml by mouth every 6 hours prn from 09/12/2018-09/14/2018.then give 15 ml by mouth every 6 hours prn starting 09/15/2018.  Marland Kitchen ketoconazole (NIZORAL) 2 % shampoo Apply 1 application topically 2 (two) times a week. Tues., Thurs.  Marland Kitchen levofloxacin (LEVAQUIN) 250 MG tablet Take 250 mg by mouth daily.  . Probiotic Product (RISA-BID PROBIOTIC) TABS Take 1 tablet by mouth twice day  . risperiDONE (RISPERDAL) 0.25 MG tablet Take 0.25 mg by mouth daily.  . risperiDONE (RISPERDAL) 1 MG/ML oral solution Take 0.5 mg by mouth at bedtime.   Of note she has been started on Lasix 20 mg p.o. daily    No facility-administered encounter medications on file as of 09/13/2018.     Review of Systems   Unobtainable secondary to dementia please see HPI-  Nursing staff and daughter do not report any complaints including any increased shortness of breath or chest pain or any increased behaviors      Immunization History  Administered Date(s) Administered  . Influenza-Unspecified 06/08/2014, 06/03/2016, 06/05/2017  . Pneumococcal Conjugate-13 10/30/2015  . Pneumococcal-Unspecified 06/10/2016  . Tdap 05/22/2017       Pertinent  Health Maintenance Due  Topic Date Due  .  INFLUENZA VACCINE  Completed  . DEXA SCAN  Completed  . PNA vac Low Risk Adult  Completed   Fall Risk  06/01/2018 05/21/2017  Falls in the past year? No Yes  Number falls in past yr: - 1  Injury with Fall? - Yes    Physical exam.  She is afebrile pulse of 82 respirations of 17.   Weight is 98.2 which is down a couple pounds since the beginning of the month.  In general this is a well-nourished healthy female no distress sitting comfortably in her  wheelchair.  His skin is warm and dry she does have baseline seborrheic keratosis diffuse  Eyes visual acuity appears to be intact sclera and conjunctive are clear.  Oropharynx is clear from what I could assess patient did not open her mouth much.  Chest is clear to auscultation with poor respiratory effort could not appreciate any labored breathing or gesturing.  Heart is largely regular rate and rhythm with an infrequent irregular beat -- has chronic venous stasis edema appears relatively baseline  Musculoskeletal appears able to move her extremities at baseline continues with lower extremity weakness this is not new.  Neurologic she is alert she does make eye contact she is cooperative with exam but does not follow any simple verbal command.  Labs.  September 21, 2017.  Sodium 141 potassium 3.2 BUN 35 creatinine 1.89  July 02, 2018.  WBC 8.7 hemoglobin 10.7 platelets 235 assessment and plan.  Assessment and plan.  1.-  History of x-ray showing suspected interstitial edema-concerns for diastolic CHF- she has been started on low-dose Lasix appears to be tolerating this well renal function appears relatively stable with her history of chronic kidney disease-I do note her potassium is slightly low will supplement this with 20 mEq of potassium and have this rechecked first laboratory day next week- her daughter also asked about getting a banana once a day which I will try to arrange.  Her renal function appears to be stable  Her weight has trended down slightly previously had gained about 6 pounds late last year.  2.  Cough with suspicions for possible bronchitis she ihascompleted therapy and this appears to be improved  at this time-she did get a course of Levaquin as well as Robitussin  [id number]

## 2018-09-27 ENCOUNTER — Encounter (HOSPITAL_COMMUNITY)
Admission: RE | Admit: 2018-09-27 | Discharge: 2018-09-27 | Disposition: A | Discharge status: Home / Self Care | Payer: Medicare Other | Source: Skilled Nursing Facility | Attending: *Deleted | Admitting: *Deleted

## 2018-09-27 DIAGNOSIS — I129 Hypertensive chronic kidney disease with stage 1 through stage 4 chronic kidney disease, or unspecified chronic kidney disease: Secondary | ICD-10-CM | POA: Diagnosis not present

## 2018-09-27 DIAGNOSIS — N184 Chronic kidney disease, stage 4 (severe): Secondary | ICD-10-CM | POA: Diagnosis not present

## 2018-09-27 LAB — BASIC METABOLIC PANEL
Anion gap: 7 (ref 5–15)
BUN: 38 mg/dL — AB (ref 8–23)
CO2: 24 mmol/L (ref 22–32)
Calcium: 8.5 mg/dL — ABNORMAL LOW (ref 8.9–10.3)
Chloride: 111 mmol/L (ref 98–111)
Creatinine, Ser: 1.85 mg/dL — ABNORMAL HIGH (ref 0.44–1.00)
GFR calc non Af Amer: 23 mL/min — ABNORMAL LOW (ref >60)
GFR, EST AFRICAN AMERICAN: 27 mL/min — AB (ref >60)
Glucose, Bld: 94 mg/dL (ref 70–99)
POTASSIUM: 3.6 mmol/L (ref 3.5–5.1)
Sodium: 142 mmol/L (ref 135–145)

## 2018-10-04 ENCOUNTER — Encounter (HOSPITAL_COMMUNITY)
Admission: RE | Admit: 2018-10-04 | Discharge: 2018-10-04 | Disposition: A | Discharge status: Home / Self Care | Payer: Medicare Other | Source: Skilled Nursing Facility | Attending: Internal Medicine | Admitting: Internal Medicine

## 2018-10-04 DIAGNOSIS — F0391 Unspecified dementia with behavioral disturbance: Secondary | ICD-10-CM | POA: Insufficient documentation

## 2018-10-04 DIAGNOSIS — F29 Unspecified psychosis not due to a substance or known physiological condition: Secondary | ICD-10-CM | POA: Diagnosis not present

## 2018-10-04 DIAGNOSIS — I129 Hypertensive chronic kidney disease with stage 1 through stage 4 chronic kidney disease, or unspecified chronic kidney disease: Secondary | ICD-10-CM | POA: Diagnosis present

## 2018-10-04 DIAGNOSIS — N184 Chronic kidney disease, stage 4 (severe): Secondary | ICD-10-CM | POA: Diagnosis present

## 2018-10-04 LAB — BASIC METABOLIC PANEL
Anion gap: 9 (ref 5–15)
BUN: 31 mg/dL — ABNORMAL HIGH (ref 8–23)
CALCIUM: 8.9 mg/dL (ref 8.9–10.3)
CO2: 23 mmol/L (ref 22–32)
CREATININE: 1.88 mg/dL — AB (ref 0.44–1.00)
Chloride: 110 mmol/L (ref 98–111)
GFR calc non Af Amer: 23 mL/min — ABNORMAL LOW (ref >60)
GFR, EST AFRICAN AMERICAN: 26 mL/min — AB (ref >60)
GLUCOSE: 95 mg/dL (ref 70–99)
Potassium: 4.2 mmol/L (ref 3.5–5.1)
Sodium: 142 mmol/L (ref 135–145)

## 2018-10-07 ENCOUNTER — Encounter: Payer: Self-pay | Admitting: Internal Medicine

## 2018-10-07 ENCOUNTER — Non-Acute Institutional Stay (SKILLED_NURSING_FACILITY): Payer: Medicare Other | Admitting: Internal Medicine

## 2018-10-07 DIAGNOSIS — I10 Essential (primary) hypertension: Secondary | ICD-10-CM | POA: Diagnosis not present

## 2018-10-07 DIAGNOSIS — F0391 Unspecified dementia with behavioral disturbance: Secondary | ICD-10-CM | POA: Diagnosis not present

## 2018-10-07 DIAGNOSIS — N184 Chronic kidney disease, stage 4 (severe): Secondary | ICD-10-CM | POA: Diagnosis not present

## 2018-10-07 DIAGNOSIS — D631 Anemia in chronic kidney disease: Secondary | ICD-10-CM

## 2018-10-07 DIAGNOSIS — I739 Peripheral vascular disease, unspecified: Secondary | ICD-10-CM | POA: Diagnosis not present

## 2018-10-07 DIAGNOSIS — L603 Nail dystrophy: Secondary | ICD-10-CM | POA: Diagnosis not present

## 2018-10-07 DIAGNOSIS — B351 Tinea unguium: Secondary | ICD-10-CM | POA: Diagnosis not present

## 2018-10-07 NOTE — Progress Notes (Signed)
Location:    [facility] Room Number: 106/D Place of Service:  SNF [phone]) Provider: Veleta Miners MD  Virgie Dad, MD  Patient Care Team: Virgie Dad, MD as PCP - General (Internal Medicine) Virgie Dad, MD as Consulting Physician (Geriatric Medicine) Rolm Baptise as Physician Assistant (Internal Medicine)  Extended Emergency Contact Information Primary Emergency Contact: [place], [place], [place] [address] [address] of [place] Phone: [phone] Mobile Phone: [phone] Relation: Daughter Secondary Emergency Contact: Fredericka, [place],  [address] [place] of [place] Phone: [phone] Relation: Son  Code Status:  DNR Goals of care: Advanced Directive information Advanced Directives 10/07/2018  Does Patient Have a Medical Advance Directive? Yes  Type of Advance Directive Out of facility DNR (pink MOST or yellow form)  Does patient want to make changes to medical advance directive? No - Patient declined  Copy of [place] in Chart? No - copy requested  Pre-existing out of facility DNR order (yellow form or pink MOST form) -     Chief Complaint  Patient presents with  . Medical Management of Chronic Issues    Routine visit of medical management    HPI:  Pt is a 85 y.o. female seen today for medical management of chronic diseases.    Patienthas h/o Dementia with Behavior problems,Diastolic CHF, Hypertension, CKD Stage 4, Anemia And h/O Femur fracture Patient has advances Dementia. She is Wheelchair Bound and mostly does not follow Commands. Recently due to her coughing a chest x-ray was done and it showed pulmonary congestion and she was started on Lasix low-dose.  Since then she has been doing better.  I did not notice any coughing and per nurses she has not had any acute distress.  She has gained almost 5 pounds in past few months. No New issues Past Medical History:  Diagnosis  Date  . Alzheimer disease ([place])   . Anemia   . Anxiety   . Arthritis   . Hyperlipemia   . Hypertension   . Kidney calculus   . Pneumonia    Past Surgical History:  Procedure Laterality Date  . CHOLECYSTECTOMY    . HIP ARTHROPLASTY Left 11/18/2013   Procedure: ARTHROPLASTY BIPOLAR HIP;  Surgeon: Sanjuana Kava, MD;  Location: [facility];  Service: Orthopedics;  Laterality: Left;    Allergies  Allergen Reactions  . Sulfa Antibiotics Rash    Outpatient Encounter Medications as of 10/07/2018  Medication Sig  . aspirin EC 81 MG tablet Take 81 mg by mouth daily.  . calcium-vitamin D (OSCAL WITH D) 500-200 MG-UNIT tablet Take 1 tablet by mouth 2 (two) times daily.  Marland Kitchen dextromethorphan 15 MG/5ML syrup Give 15 ml by mouth every 6 hours prn from 09/12/2018-09/14/2018.then give 15 ml by mouth every 6 hours prn starting 09/15/2018.  . furosemide (LASIX) 20 MG tablet Take 20 mg by mouth daily.  Marland Kitchen ketoconazole (NIZORAL) 2 % shampoo Apply 1 application topically 2 (two) times a week. Tues., Thurs.  . potassium chloride (KLOR-CON) 20 MEQ packet Take 20 mEq by mouth daily.  . risperiDONE (RISPERDAL) 0.25 MG tablet Take 0.25 mg by mouth daily.  . risperiDONE (RISPERDAL) 1 MG/ML oral solution Take 0.5 mg by mouth at bedtime.   . [DISCONTINUED] clotrimazole-betamethasone (LOTRISONE) cream Apply 1 application topically daily as needed.  . [DISCONTINUED] levofloxacin (LEVAQUIN) 250 MG tablet Take 250 mg by  mouth daily.  . [DISCONTINUED] Probiotic Product (RISA-BID PROBIOTIC) TABS Take 1 tablet by mouth twice day   No facility-administered encounter medications on file as of 10/07/2018.      Review of Systems  Unable to perform ROS: Dementia    Immunization History  Administered Date(s) Administered  . Influenza-Unspecified 06/08/2014, 06/03/2016, 06/05/2017  . Pneumococcal Conjugate-13 10/30/2015  . Pneumococcal-Unspecified 06/10/2016  . Tdap 05/22/2017   Pertinent  Health Maintenance Due  Topic Date  Due  . INFLUENZA VACCINE  Completed  . DEXA SCAN  Completed  . PNA vac Low Risk Adult  Completed   Fall Risk  06/01/2018 05/21/2017  Falls in the past year? No Yes  Number falls in past yr: - 1  Injury with Fall? - Yes   Functional Status Survey:    Vitals:   10/07/18 1118  BP: 120/64  Pulse: 77  Resp: 18  Temp: 98.7 F (37.1 C)  TempSrc: Oral  SpO2: 97%  Weight: 200 lb 3.2 oz (90.8 kg)  Height: 5\' 1"  (1.549 m)   Body mass index is 37.83 kg/m. Physical Exam Constitutional:      Appearance: She is well-developed.  HENT:     Head: Normocephalic.  Eyes:     Pupils: Pupils are equal, round, and reactive to light.  Neck:     Musculoskeletal: Neck supple.  Cardiovascular:     Rate and Rhythm: Normal rate.     Heart sounds: Murmur present.  Pulmonary:     Effort: Pulmonary effort is normal. No respiratory distress.     Breath sounds: Normal breath sounds. No wheezing or rales.  Abdominal:     General: Bowel sounds are normal. There is no distension.     Palpations: Abdomen is soft.     Tenderness: There is no abdominal tenderness. There is no rebound.  Skin:    General: Skin is warm and dry.  Neurological:     Mental Status: She is alert.     Comments: Does not follow any commands. Stays in the Wheelchair and is not Ambulatory. Complete dependent for her ADL Does get aggressive with staff sometimes.  Psychiatric:        Behavior: Behavior normal.     Labs reviewed: Recent Labs    09/21/18 0700 09/27/18 0100 10/04/18 0036  NA 141 142 142  K 3.2* 3.6 4.2  CL 108 111 110  CO2 24 24 23   GLUCOSE 96 94 95  BUN 35* 38* 31*  CREATININE 1.89* 1.85* 1.88*  CALCIUM 8.6* 8.5* 8.9   Recent Labs    07/02/18 0715  AST 11*  ALT 10  ALKPHOS 83  BILITOT 0.5  PROT 6.4*  ALBUMIN 2.9*   Recent Labs    11/06/17 0700 01/29/18 0747 07/02/18 0715  WBC 6.6 7.6 8.7  NEUTROABS 3.5 4.4 4.8  HGB 10.7* 11.1* 10.7*  HCT 35.3* 35.5* 35.7*  MCV 90.7 86.8 89.3  PLT  243 228 235   Lab Results  Component Value Date   TSH 1.708 12/08/2016   Lab Results  Component Value Date   HGBA1C 5.2 07/14/2016   Lab Results  Component Value Date   CHOL 166 12/27/2015   HDL 44 12/27/2015   LDLCALC 114 (H) 12/27/2015   TRIG 39 12/27/2015   CHOLHDL 3.8 12/27/2015    Significant Diagnostic Results in last 30 days:  No results found.  Assessment/Plan Diastolic CHF At her age and frailty would not do Cardiac Echo Treat her Symptomatically On lasix and  Potassium  Essential hypertension Patient's blood pressure usually stays around 140  Syncope  Patient has a history of syncope which was never worked up because due to her age No new episodes recently Dementia with behavioral disturbances Needs 24-hour supportive care Continue on low-dose of Risperdal CKD stage IV Creatinine stable Anemia due to CKD Hemoglobin stable     Family/ staff Communication:   Labs/tests ordered:

## 2018-11-08 ENCOUNTER — Encounter: Payer: Self-pay | Admitting: Adult Health

## 2018-11-08 ENCOUNTER — Non-Acute Institutional Stay (SKILLED_NURSING_FACILITY): Payer: Medicare Other | Admitting: Adult Health

## 2018-11-08 DIAGNOSIS — I10 Essential (primary) hypertension: Secondary | ICD-10-CM | POA: Diagnosis not present

## 2018-11-08 DIAGNOSIS — R600 Localized edema: Secondary | ICD-10-CM | POA: Diagnosis not present

## 2018-11-08 DIAGNOSIS — F03918 Unspecified dementia, unspecified severity, with other behavioral disturbance: Secondary | ICD-10-CM

## 2018-11-08 DIAGNOSIS — L210 Seborrhea capitis: Secondary | ICD-10-CM

## 2018-11-08 DIAGNOSIS — F0391 Unspecified dementia with behavioral disturbance: Secondary | ICD-10-CM

## 2018-11-08 DIAGNOSIS — N184 Chronic kidney disease, stage 4 (severe): Secondary | ICD-10-CM

## 2018-11-08 NOTE — Progress Notes (Signed)
Location:   [facility] Room Number: 106 D Place of Service:  SNF (31)   CODE STATUS: DNR  Allergies  Allergen Reactions  . Sulfa Antibiotics Rash    Chief Complaint  Patient presents with  . Medical Management of Chronic Issues    Dementia with behavioral disturbance unspecified dementia type; essential hypertension; CKD (Chronic kidney disease) stage IV     HPI:  She is a 85 year old long term resident of this facility being seen for the management of her chronic illnesses: dementia; hypertension; ckd stage 4. There are no reports of changes in appetite; no reports of uncontrolled pain; no reports of anxiety or agitation.   Past Medical History:  Diagnosis Date  . Alzheimer disease ([place])   . Anemia   . Anxiety   . Arthritis   . Hyperlipemia   . Hypertension   . Kidney calculus   . Pneumonia     Past Surgical History:  Procedure Laterality Date  . CHOLECYSTECTOMY    . HIP ARTHROPLASTY Left 11/18/2013   Procedure: ARTHROPLASTY BIPOLAR HIP;  Surgeon: Sanjuana Kava, MD;  Location: [facility];  Service: Orthopedics;  Laterality: Left;    Social History   Socioeconomic History  . Marital status: Widowed    Spouse name: Not on file  . Number of children: Not on file  . Years of education: Not on file  . Highest education level: Not on file  Occupational History  . Not on file  Social Needs  . Financial resource strain: Not on file  . Food insecurity:    Worry: Not on file    Inability: Not on file  . Transportation needs:    Medical: Not on file    Non-medical: Not on file  Tobacco Use  . Smoking status: Never Smoker  . Smokeless tobacco: Never Used  Substance and Sexual Activity  . Alcohol use: No  . Drug use: No  . Sexual activity: Not on file  Lifestyle  . Physical activity:    Days per week: Not on file    Minutes per session: Not on file  . Stress: Not on file  Relationships  . Social connections:    Talks on phone: Not  on file    Gets together: Not on file    Attends religious service: Not on file    Active member of club or organization: Not on file    Attends meetings of clubs or organizations: Not on file    Relationship status: Not on file  . Intimate partner violence:    Fear of current or ex partner: Not on file    Emotionally abused: Not on file    Physically abused: Not on file    Forced sexual activity: Not on file  Other Topics Concern  . Not on file  Social History Narrative  . Not on file   Family History  Problem Relation Age of Onset  . Alzheimer's disease Mother   . Heart attack Father       VITAL SIGNS BP 129/65   Pulse 92   Temp 97.7 F (36.5 C)   Resp 18   Ht 5\' 1"  (1.549 m)   Wt 197 lb 6.4 oz (89.5 kg)   BMI 37.30 kg/m   Outpatient Encounter Medications as of 11/08/2018  Medication Sig  . aspirin 81 MG chewable tablet Chew 81 mg by mouth daily.  . calcium-vitamin D (OSCAL WITH D) 500-200 MG-UNIT tablet Take 1  tablet by mouth 2 (two) times daily.  Marland Kitchen dextromethorphan 15 MG/5ML syrup Give 15 ml by mouth every 6 hours prn from 09/12/2018-09/14/2018.then give 15 ml by mouth every 6 hours prn starting 09/15/2018.  . furosemide (LASIX) 20 MG tablet Take 20 mg by mouth daily.  Marland Kitchen ketoconazole (NIZORAL) 2 % shampoo Apply 1 application topically 2 (two) times a week. On Tues., Thurs.  . NON FORMULARY Diet Type:  NAS  . potassium chloride (KLOR-CON) 20 MEQ packet Take 20 mEq by mouth daily.  . risperiDONE (RISPERDAL) 0.25 MG tablet Take 0.25 mg by mouth daily.  . risperiDONE (RISPERDAL) 1 MG/ML oral solution Take 0.5 mg by mouth at bedtime.   . [DISCONTINUED] aspirin EC 81 MG tablet Take 81 mg by mouth daily.   No facility-administered encounter medications on file as of 11/08/2018.      SIGNIFICANT DIAGNOSTIC EXAMS  LABS REVIEWED TODAY:   10-04-18: glucose 95; bun 31; creat 1.88 k+ 4.2; na++ 142 ca 8.9   Review of Systems  Unable to perform ROS: Dementia (unable to  participate )    Physical Exam Constitutional:      General: She is not in acute distress.    Appearance: She is well-developed. She is not diaphoretic.  Neck:     Musculoskeletal: Neck supple.     Thyroid: No thyromegaly.  Cardiovascular:     Rate and Rhythm: Normal rate and regular rhythm.     Pulses: Normal pulses.     Heart sounds: Murmur present.     Comments: 1/6 Pulmonary:     Effort: Pulmonary effort is normal. No respiratory distress.     Breath sounds: Normal breath sounds.  Abdominal:     General: Bowel sounds are normal. There is no distension.     Palpations: Abdomen is soft.     Tenderness: There is no abdominal tenderness.  Musculoskeletal:     Right lower leg: Edema present.     Left lower leg: Edema present.     Comments: Is able to move all extremities  History of left hip arthroplasty 2015  1+ bilateral lower extremity edema Uses wheelchair   Lymphadenopathy:     Cervical: No cervical adenopathy.  Skin:    General: Skin is warm and dry.  Neurological:     Mental Status: She is alert. Mental status is at baseline.  Psychiatric:        Mood and Affect: Mood normal.      ASSESSMENT/ PLAN:  TODAY;   1. Essential hypertension: is stable b/p 129/65: will continue asa 81 mg daily will monitor  2. Dementia with behavioral disturbance unspecified dementia type is without change: weight is 197 pounds  3. CKD (Chronic kidney disease) stage IV: is stable bun 31; creat 1.88 will monitor  4. Psychosis in elderly with behavioral disturbance: is without change will continue risperdal 0.5 mg in the AM and 0.25 mg in the PM can get aggressive with staff   5. Bilateral lower extremity edema: is stable will continue lasix 20 mg daily with k+ 20 meq daily   6. Dandruff : is stable will continue nizoral shampoo twice weekly    Will check cbc; cmp hgb a1c      MD is aware of resident's narcotic use and is in agreement with current plan of care. We will  attempt to wean resident as apropriate   Ok Edwards NP [facility]  Contact [phone] Monday through Friday 8am- 5pm  After hours call [phone]

## 2018-11-09 ENCOUNTER — Encounter (HOSPITAL_COMMUNITY)
Admission: RE | Admit: 2018-11-09 | Discharge: 2018-11-09 | Disposition: A | Discharge status: Home / Self Care | Payer: Medicare Other | Source: Skilled Nursing Facility | Attending: Internal Medicine | Admitting: Internal Medicine

## 2018-11-09 DIAGNOSIS — N189 Chronic kidney disease, unspecified: Secondary | ICD-10-CM | POA: Insufficient documentation

## 2018-11-09 DIAGNOSIS — E876 Hypokalemia: Secondary | ICD-10-CM | POA: Insufficient documentation

## 2018-11-09 DIAGNOSIS — Z79899 Other long term (current) drug therapy: Secondary | ICD-10-CM | POA: Insufficient documentation

## 2018-11-09 DIAGNOSIS — I129 Hypertensive chronic kidney disease with stage 1 through stage 4 chronic kidney disease, or unspecified chronic kidney disease: Secondary | ICD-10-CM | POA: Diagnosis present

## 2018-11-09 LAB — COMPREHENSIVE METABOLIC PANEL
ALT: 19 U/L (ref 0–44)
AST: 17 U/L (ref 15–41)
Albumin: 3.2 g/dL — ABNORMAL LOW (ref 3.5–5.0)
Alkaline Phosphatase: 86 U/L (ref 38–126)
Anion gap: 8 (ref 5–15)
BUN: 39 mg/dL — ABNORMAL HIGH (ref 8–23)
CHLORIDE: 111 mmol/L (ref 98–111)
CO2: 23 mmol/L (ref 22–32)
Calcium: 8.6 mg/dL — ABNORMAL LOW (ref 8.9–10.3)
Creatinine, Ser: 1.8 mg/dL — ABNORMAL HIGH (ref 0.44–1.00)
GFR calc non Af Amer: 24 mL/min — ABNORMAL LOW (ref >60)
GFR, EST AFRICAN AMERICAN: 28 mL/min — AB (ref >60)
Glucose, Bld: 89 mg/dL (ref 70–99)
POTASSIUM: 3.5 mmol/L (ref 3.5–5.1)
SODIUM: 142 mmol/L (ref 135–145)
Total Bilirubin: 0.2 mg/dL — ABNORMAL LOW (ref 0.3–1.2)
Total Protein: 7 g/dL (ref 6.5–8.1)

## 2018-11-09 LAB — CBC
HCT: 37.8 % (ref 36.0–46.0)
Hemoglobin: 11.3 g/dL — ABNORMAL LOW (ref 12.0–15.0)
MCH: 26 pg (ref 26.0–34.0)
MCHC: 29.9 g/dL — ABNORMAL LOW (ref 30.0–36.0)
MCV: 86.9 fL (ref 80.0–100.0)
NRBC: 0 % (ref 0.0–0.2)
PLATELETS: 274 10*3/uL (ref 150–400)
RBC: 4.35 MIL/uL (ref 3.87–5.11)
RDW: 13.8 % (ref 11.5–15.5)
WBC: 7.7 10*3/uL (ref 4.0–10.5)

## 2018-11-09 LAB — HEMOGLOBIN A1C
HEMOGLOBIN A1C: 5.4 % (ref 4.8–5.6)
MEAN PLASMA GLUCOSE: 108.28 mg/dL

## 2018-11-14 ENCOUNTER — Encounter: Payer: Self-pay | Admitting: Adult Health

## 2018-11-14 DIAGNOSIS — F03918 Unspecified dementia, unspecified severity, with other behavioral disturbance: Secondary | ICD-10-CM | POA: Insufficient documentation

## 2018-11-14 DIAGNOSIS — L210 Seborrhea capitis: Secondary | ICD-10-CM | POA: Insufficient documentation

## 2018-11-14 DIAGNOSIS — F0391 Unspecified dementia with behavioral disturbance: Secondary | ICD-10-CM | POA: Insufficient documentation

## 2018-11-14 DIAGNOSIS — R600 Localized edema: Secondary | ICD-10-CM | POA: Insufficient documentation

## 2018-11-27 DIAGNOSIS — J690 Pneumonitis due to inhalation of food and vomit: Secondary | ICD-10-CM | POA: Diagnosis not present

## 2018-11-29 ENCOUNTER — Non-Acute Institutional Stay (SKILLED_NURSING_FACILITY): Payer: Medicare Other | Admitting: Internal Medicine

## 2018-11-29 ENCOUNTER — Encounter: Payer: Self-pay | Admitting: Internal Medicine

## 2018-11-29 ENCOUNTER — Telehealth: Payer: Self-pay | Admitting: Internal Medicine

## 2018-11-29 DIAGNOSIS — F0391 Unspecified dementia with behavioral disturbance: Secondary | ICD-10-CM

## 2018-11-29 DIAGNOSIS — T17908D Unspecified foreign body in respiratory tract, part unspecified causing other injury, subsequent encounter: Secondary | ICD-10-CM

## 2018-11-29 DIAGNOSIS — I10 Essential (primary) hypertension: Secondary | ICD-10-CM | POA: Diagnosis not present

## 2018-11-29 DIAGNOSIS — L0291 Cutaneous abscess, unspecified: Secondary | ICD-10-CM

## 2018-11-29 NOTE — Telephone Encounter (Signed)
Received phone call on Saturday evening that 85yo pt with history of advanced dementia with dysphagia had an aspiration episode, was crackling and gurgling and nurse could see fluid in pt's throat.  She was running a fever of 100.4 POX 93% BP 128/72.  She had DNR status, but no MOST form on file.  CXR 2 view was ordered.  Pt had been afebrile before the aspiration episode occurred with T97.8.  She was actually to start doxycycline for a right shoulder abscess, but now could not swallow.  Due to this, no new abx were immediately ordered.  Instructions were given for light suction to see if her crackling and gurgling could be relieved.  Nurse was going to call her daughter and let her know the situation.  We opted to focus on comfort pending further information.  I did not get a report of the CXR results or if she improved after suction (would have been overnight).  Sunday call should be aware of this.  Nurse had shared that pt's daughter was typically at facility daily before the quarantine began and the thought was that she would not want her hospitalized, but this needed to be confirmed.  Dez Stauffer L. Eason Housman, D.O. [facility] [address], [place] [address] Cell Phone (Mon-Fri 8am-5pm):  [phone] On Call:  [phone] & follow prompts after 5pm & weekends Office Phone:  [phone] Office Fax:  [phone]

## 2018-11-29 NOTE — Progress Notes (Signed)
Location:  [place] [facility] Room Number: [place] of Service:  SNF [phone]) Provider:  Veleta Miners, MD  Virgie Dad, MD  Patient Care Team: Virgie Dad, MD as PCP - General (Internal Medicine) Virgie Dad, MD as Consulting Physician (Geriatric Medicine) Rolm Baptise as Physician Assistant (Internal Medicine)  Extended Emergency Contact Information Primary Emergency Contact: Jia, [place], [place] [address] Johnnette Litter of [place] Phone: [phone] Mobile Phone: [phone] Relation: Daughter Secondary Emergency Contact: Evalynne, [place], [place] [address] [place] of [place] Phone: [phone] Relation: Son  Code Status:  DNR Goals of care: Advanced Directive information Advanced Directives 11/29/2018  Does Patient Have a Medical Advance Directive? Yes  Type of Advance Directive Out of facility DNR (pink MOST or yellow form)  Does patient want to make changes to medical advance directive? No - Patient declined  Copy of [place] in Chart? -  Pre-existing out of facility DNR order (yellow form or pink MOST form) Yellow form placed in chart (order not valid for inpatient use);Pink MOST form placed in chart (order not valid for inpatient use)     Chief Complaint  Patient presents with   Acute Visit    Aspiration    HPI:  Pt is a 85 y.o. female seen today for an acute visit for Right Shoulder abscess and Aspiration over the weekend Patienthas h/o Dementia with Behavior problems,Diastolic CHF, Hypertension, CKD Stage 4, Anemia And h/O Femur fracture Patient has advances Dementia. She was noticed to have a abscess on her right Shoulder.I had started her on doxycycline. Yesterday patient was also noticed to have episode of dysphagia with aspiration and the nurses noticed some fluid in patient's throat.  She also spiked a temp of 100.4 but her other vitals were stable.  She had a chest x-ray done  by on-call physician which showed no acute processes.  Patient responded to mild suctioning and is back to her baseline today. She does not have any fever or chills this morning no cough or shortness of breath.  She had before and usually coughs and clears her throat. Her daughter who used to come every day to help her is not been able to come due to COVID-19 restrictions in facility.  She has told us before that she does want her mom to go to the hospital if there is some treatable disease.  Past Medical History:  Diagnosis Date   Alzheimer disease ([place])    Anemia    Anxiety    Arthritis    Dementia ([place]) 11/17/2013   She is on alprazolam as well as generic Rispedal twice a day    Hyperlipemia    Hypertension    Kidney calculus    Pneumonia    UTI (urinary tract infection) With MRSA 12/11/2016   Past Surgical History:  Procedure Laterality Date   CHOLECYSTECTOMY     HIP ARTHROPLASTY Left 11/18/2013   Procedure: ARTHROPLASTY BIPOLAR HIP;  Surgeon: Sanjuana Kava, MD;  Location: [facility];  Service: Orthopedics;  Laterality: Left;    Allergies  Allergen Reactions   Sulfa Antibiotics Rash    Outpatient Encounter Medications as of 11/29/2018  Medication Sig   aspirin 81 MG chewable tablet Chew 81 mg by mouth daily.   calcium-vitamin D (OSCAL WITH D) 500-200 MG-UNIT tablet Take 1 tablet by mouth 2 (two) times daily.  dextromethorphan 15 MG/5ML syrup Give 15 ml by mouth every 6 hours prn from 09/12/2018-09/14/2018.then give 15 ml by mouth every 6 hours prn starting 09/15/2018.   doxycycline (DORYX) 100 MG EC tablet Take 100 mg by mouth 2 (two) times daily.   furosemide (LASIX) 20 MG tablet Take 20 mg by mouth daily.   ketoconazole (NIZORAL) 2 % shampoo Apply 1 application topically daily as needed. As needed On Tues., Thurs.   NON FORMULARY Diet Type:  NAS   potassium chloride (KLOR-CON) 20 MEQ packet Take 20 mEq by mouth daily.   risperiDONE (RISPERDAL) 0.25 MG tablet  Take 0.25 mg by mouth daily.   risperiDONE (RISPERDAL) 1 MG/ML oral solution Take 0.5 mg by mouth at bedtime.    No facility-administered encounter medications on file as of 11/29/2018.     Review of Systems  Unable to perform ROS: Dementia    Immunization History  Administered Date(s) Administered   Influenza-Unspecified 06/08/2014, 07/02/2015, 06/03/2016, 06/05/2017, 06/03/2018   Pneumococcal Conjugate-13 10/30/2015   Pneumococcal-Unspecified 06/10/2016   Tdap 05/22/2017   Pertinent  Health Maintenance Due  Topic Date Due   INFLUENZA VACCINE  Completed   DEXA SCAN  Completed   PNA vac Low Risk Adult  Completed   Fall Risk  06/01/2018 05/21/2017  Falls in the past year? No Yes  Number falls in past yr: - 1  Injury with Fall? - Yes   Functional Status Survey:    Vitals:   11/29/18 1110  Weight: 197 lb 6.4 oz (89.5 kg)  Height: 5\' 1"  (1.549 m)   Body mass index is 37.3 kg/m. Physical Exam Constitutional:      Appearance: She is well-developed.  HENT:     Head: Normocephalic.  Eyes:     Pupils: Pupils are equal, round, and reactive to light.  Neck:     Musculoskeletal: Neck supple.  Cardiovascular:     Rate and Rhythm: Normal rate.     Heart sounds: Murmur present.  Pulmonary:     Effort: Pulmonary effort is normal. No respiratory distress.     Breath sounds: Normal breath sounds. No wheezing or rales.  Abdominal:     General: Bowel sounds are normal. There is no distension.     Palpations: Abdomen is soft.     Tenderness: There is no abdominal tenderness. There is no rebound.  Skin:    General: Skin is warm and dry.     Comments: Has 5 x5 Cm induration with small Abscess in her back of  right shoulder.  Neurological:     Mental Status: She is alert.     Comments: Does not follow any commands. Stays in the Wheelchair and is not Ambulatory. Complete dependent for her ADL Does get aggressive with staff sometimes.  Psychiatric:        Behavior:  Behavior normal.     Labs reviewed: Recent Labs    09/27/18 0100 10/04/18 0036 11/09/18 0700  NA 142 142 142  K 3.6 4.2 3.5  CL 111 110 111  CO2 24 23 23   GLUCOSE 94 95 89  BUN 38* 31* 39*  CREATININE 1.85* 1.88* 1.80*  CALCIUM 8.5* 8.9 8.6*   Recent Labs    07/02/18 0715 11/09/18 0700  AST 11* 17  ALT 10 19  ALKPHOS 83 86  BILITOT 0.5 0.2*  PROT 6.4* 7.0  ALBUMIN 2.9* 3.2*   Recent Labs    01/29/18 0747 07/02/18 0715 11/09/18 0700  WBC 7.6 8.7 7.7  NEUTROABS  4.4 4.8  --   HGB 11.1* 10.7* 11.3*  HCT 35.5* 35.7* 37.8  MCV 86.8 89.3 86.9  PLT 228 235 274   Lab Results  Component Value Date   TSH 1.708 12/08/2016   Lab Results  Component Value Date   HGBA1C 5.4 11/09/2018   Lab Results  Component Value Date   CHOL 166 12/27/2015   HDL 44 12/27/2015   LDLCALC 114 (H) 12/27/2015   TRIG 39 12/27/2015   CHOLHDL 3.8 12/27/2015    Significant Diagnostic Results in last 30 days:  No results found.  Assessment/Plan Episode of Aspiration Patient is back to normal Have d/w the Social worker who says that daughter will come tomorrow to discuss MOST form for her mom Chest Xray is negative No Change in treatment right now Shoulder Abscess Continue doxycycline I 'donot think she iwll need I & D Will continue to follow   Other stable issues Diastolic CHF On lasix and Potassium  Essential hypertension Patient's blood pressure usually stays around 140  Syncope Patient has a history of syncope which was never worked up because due to her age No new episodes recently Dementia with behavioral disturbances Needs 24-hour supportive care Continue on low-dose of Risperdal CKD stage IV Creatinine stable Anemia due to CKD Hemoglobin stable  Family/ staff Communication:   Labs/tests ordered:

## 2018-12-03 ENCOUNTER — Encounter: Payer: Self-pay | Admitting: Internal Medicine

## 2018-12-03 ENCOUNTER — Non-Acute Institutional Stay (SKILLED_NURSING_FACILITY): Payer: Medicare Other | Admitting: Internal Medicine

## 2018-12-03 DIAGNOSIS — N184 Chronic kidney disease, stage 4 (severe): Secondary | ICD-10-CM

## 2018-12-03 DIAGNOSIS — J690 Pneumonitis due to inhalation of food and vomit: Secondary | ICD-10-CM | POA: Diagnosis not present

## 2018-12-03 DIAGNOSIS — R0902 Hypoxemia: Secondary | ICD-10-CM | POA: Diagnosis not present

## 2018-12-03 DIAGNOSIS — F0391 Unspecified dementia with behavioral disturbance: Secondary | ICD-10-CM | POA: Diagnosis not present

## 2018-12-03 DIAGNOSIS — F411 Generalized anxiety disorder: Secondary | ICD-10-CM | POA: Diagnosis not present

## 2018-12-03 NOTE — Progress Notes (Signed)
Location:    [place] Room Number: 106/D Place of Service:  SNF [phone]) Provider:  Veleta Miners MD  Virgie Dad, MD  Patient Care Team: Virgie Dad, MD as PCP - General (Internal Medicine) Virgie Dad, MD as Consulting Physician (Geriatric Medicine) Rolm Baptise as Physician Assistant (Internal Medicine)  Extended Emergency Contact Information Primary Emergency Contact: Faigy, [place], [place] [address] Johnnette Litter of [place] Phone: [phone] Mobile Phone: [phone] Relation: Daughter Secondary Emergency Contact: Mandeep, [place], [place] [address] [place] of [place] Phone: [phone] Relation: Son  Code Status:  DNR Goals of care: Advanced Directive information Advanced Directives 12/03/2018  Does Patient Have a Medical Advance Directive? Yes  Type of Advance Directive Out of facility DNR (pink MOST or yellow form)  Does patient want to make changes to medical advance directive? No - Guardian declined  Copy of [place] in Chart? No - copy requested  Pre-existing out of facility DNR order (yellow form or pink MOST form) -     Chief Complaint  Patient presents with  . Acute Visit    Aspiration and Hypoxia    HPI:  Pt is a 85 y.o. female seen today for an acute visit for Possible aspiration and Hypoxia  Patienthas h/o Dementia with Behavior problems,Diastolic CHF,Hypertension, CKD Stage 4, Anemia And h/O Femur fracture Patient has a history of advanced dementia.  She recently had an episode of aspiration when the nurses noticed some fluid in patient's throat on the weekend.  At that time her chest x-ray was negative and patient responded to mild suctioning. But yesterday after lunch patient started to have severe cough and rales.  Her oxygen dropped to 60%.  The nurses started suctioning.  And got a lot of mucus.  No food.  Patient seemed comfortable had no fever.  But she stayed  hypoxic and had to be put on 4 to 5 L of oxygen to keep her pulse ox more than 90%.  She became more lethargic and was not responding. She is unable to give any history.  We called her daughter who was not able to visit her before due to restrictions placed.  She was able to come and see her mom and she decided that she does not want anything aggressive done.  She wants her mom to stay in the facility and for [place] to take care of her here  Past Medical History:  Diagnosis Date  . Alzheimer disease ([place])   . Anemia   . Anxiety   . Arthritis   . Dementia ([place]) 11/17/2013   She is on alprazolam as well as generic Rispedal twice a day   . Hyperlipemia   . Hypertension   . Kidney calculus   . Pneumonia   . UTI (urinary tract infection) With MRSA 12/11/2016   Past Surgical History:  Procedure Laterality Date  . CHOLECYSTECTOMY    . HIP ARTHROPLASTY Left 11/18/2013   Procedure: ARTHROPLASTY BIPOLAR HIP;  Surgeon: Sanjuana Kava, MD;  Location: [facility];  Service: Orthopedics;  Laterality: Left;    Allergies  Allergen Reactions  . Sulfa Antibiotics Rash    Outpatient Encounter Medications as of 12/03/2018  Medication Sig  . aspirin 81 MG chewable tablet Chew 81 mg by mouth daily.  . calcium-vitamin D (OSCAL WITH D) 500-200 MG-UNIT tablet Take 1 tablet by mouth  2 (two) times daily.  Marland Kitchen dextromethorphan 15 MG/5ML syrup Give 15 ml by mouth every 6 hours prn from 09/12/2018-09/14/2018.then give 15 ml by mouth every 6 hours prn starting 09/15/2018.  . [EXPIRED] doxycycline (DORYX) 100 MG EC tablet Take 100 mg by mouth 2 (two) times daily.  . furosemide (LASIX) 20 MG tablet Take 20 mg by mouth daily.  Marland Kitchen ketoconazole (NIZORAL) 2 % shampoo Apply 1 application topically daily as needed. As needed On Tues., Thurs.  . NON FORMULARY Diet Type:  NAS  . potassium chloride (KLOR-CON) 20 MEQ packet Take 20 mEq by mouth daily.  . risperiDONE (RISPERDAL) 0.25 MG tablet Take 0.25 mg by mouth daily.  . risperiDONE  (RISPERDAL) 1 MG/ML oral solution Take 0.5 mg by mouth at bedtime.    No facility-administered encounter medications on file as of 12/03/2018.      Review of Systems  Unable to perform ROS: Dementia    Immunization History  Administered Date(s) Administered  . Influenza-Unspecified 06/08/2014, 07/02/2015, 06/03/2016, 06/05/2017, 06/03/2018  . Pneumococcal Conjugate-13 10/30/2015  . Pneumococcal-Unspecified 06/10/2016  . Tdap 05/22/2017   Pertinent  Health Maintenance Due  Topic Date Due  . INFLUENZA VACCINE  04/02/2019  . DEXA SCAN  Completed  . PNA vac Low Risk Adult  Completed   Fall Risk  06/01/2018 05/21/2017  Falls in the past year? No Yes  Number falls in past yr: - 1  Injury with Fall? - Yes   Functional Status Survey:    Vitals:   12/04/18 0920  BP: 140/72  Pulse: 72  Resp: (!) 22  Temp: 98.4 F (36.9 C)  SpO2: 90%   There is no height or weight on file to calculate BMI. Physical Exam Vitals signs reviewed.  HENT:     Head: Normocephalic.     Nose: Nose normal.     Mouth/Throat:     Mouth: Mucous membranes are dry.  Eyes:     Pupils: Pupils are equal, round, and reactive to light.  Neck:     Musculoskeletal: Neck supple.  Cardiovascular:     Rate and Rhythm: Normal rate and regular rhythm.     Pulses: Normal pulses.     Heart sounds: Normal heart sounds.  Pulmonary:     Comments: Patient was hypoxic and had Rales bilateral with very wet Cough Abdominal:     General: Abdomen is flat. Bowel sounds are normal.     Palpations: Abdomen is soft.  Musculoskeletal:     Comments: Mild swelling Bilateral  Skin:    General: Skin is warm and dry.  Neurological:     General: No focal deficit present.     Mental Status: She is lethargic.     Comments: Lethargic . Was not responding. Did get better after suction and when her Sats came up  Psychiatric:     Comments: Cannot assess      Labs reviewed: Recent Labs    09/27/18 0100 10/04/18 0036 11/09/18  0700  NA 142 142 142  K 3.6 4.2 3.5  CL 111 110 111  CO2 24 23 23   GLUCOSE 94 95 89  BUN 38* 31* 39*  CREATININE 1.85* 1.88* 1.80*  CALCIUM 8.5* 8.9 8.6*   Recent Labs    07/02/18 0715 11/09/18 0700  AST 11* 17  ALT 10 19  ALKPHOS 83 86  BILITOT 0.5 0.2*  PROT 6.4* 7.0  ALBUMIN 2.9* 3.2*   Recent Labs    01/29/18 0747 07/02/18 0715 11/09/18 0700  WBC 7.6 8.7 7.7  NEUTROABS 4.4 4.8  --   HGB 11.1* 10.7* 11.3*  HCT 35.5* 35.7* 37.8  MCV 86.8 89.3 86.9  PLT 228 235 274   Lab Results  Component Value Date   TSH 1.708 12/08/2016   Lab Results  Component Value Date   HGBA1C 5.4 11/09/2018   Lab Results  Component Value Date   CHOL 166 12/27/2015   HDL 44 12/27/2015   LDLCALC 114 (H) 12/27/2015   TRIG 39 12/27/2015   CHOLHDL 3.8 12/27/2015    Significant Diagnostic Results in last 30 days:  No results found.  Assessment/Plan Patient with Advanced dementia now Hypoxic with possible Aspiration Patient did respond to suction and her O2 level came up to 90% on 4 L. I had a long discussion with her daughter and we agreed that patient would be better to stay in the facility.  She is DNR and the MOST form was filled up.  Patient's daughter wants her to get antibiotics IV fluids and tube feeding if needed.  Rocephin 2 gm I/M X1 Started her on Augmentin 875 mg twice daily for 7 days Prednisone 40 mg daily for 5 days Suction as needed. Duo nebs every 6 hours x3 days and then as needed BMP and a CBC in a.m.  We are not able to get a chest x-ray in the facility at this time.  I discussed with the daughter that she is not in a condition to be sent to the hospital for chest x-ray and it would not change our treatment at this time. Shoulder Abscess Finished Doxycyline    Diastolic CHF On lasix and Potassium At this time her Acute onset of Hypoxia is due to Aspiration   Essential hypertension Patient's blood pressure usually stays around 140  Syncope Patient  has a history of syncope which was never worked up because due to her age Dementia with behavioral disturbances Continue  24-hour supportive care Continue on low-dose of Risperdal CKD stage IV Creatinine stable Repeat Bmp Pending Anemia due to CKD Hemoglobin stable Repeat CBC pending  Family/ staff Communication:   Labs/tests ordered:   Total time spent in this patient care encounter was 45_ minutes; greater than 50% of the visit spent counseling patient daughter, reviewing records , Labs and coordinating care for problems addressed at this encounter.

## 2018-12-04 ENCOUNTER — Encounter: Payer: Self-pay | Admitting: Internal Medicine

## 2018-12-05 ENCOUNTER — Other Ambulatory Visit: Payer: Self-pay | Admitting: Internal Medicine

## 2018-12-05 MED ORDER — MORPHINE SULFATE (CONCENTRATE) 10 MG /0.5 ML PO SOLN: 5.0000 mg | ORAL | 0 refills | Status: DC | PRN

## 2018-12-05 MED ORDER — MORPHINE SULFATE (CONCENTRATE) 10 MG/0.5ML PO SOLN: 5.0000 mg | ORAL | Status: DC | PRN

## 2018-12-06 ENCOUNTER — Non-Acute Institutional Stay (SKILLED_NURSING_FACILITY): Payer: Medicare Other | Admitting: Internal Medicine

## 2018-12-06 ENCOUNTER — Other Ambulatory Visit: Payer: Self-pay | Admitting: Internal Medicine

## 2018-12-06 ENCOUNTER — Encounter: Payer: Self-pay | Admitting: Internal Medicine

## 2018-12-06 ENCOUNTER — Encounter (HOSPITAL_COMMUNITY)
Admission: RE | Admit: 2018-12-06 | Discharge: 2018-12-06 | Disposition: A | Discharge status: Home / Self Care | Payer: Medicare Other | Source: Skilled Nursing Facility | Attending: Internal Medicine | Admitting: Internal Medicine

## 2018-12-06 DIAGNOSIS — F0391 Unspecified dementia with behavioral disturbance: Secondary | ICD-10-CM

## 2018-12-06 DIAGNOSIS — F29 Unspecified psychosis not due to a substance or known physiological condition: Secondary | ICD-10-CM | POA: Insufficient documentation

## 2018-12-06 DIAGNOSIS — J690 Pneumonitis due to inhalation of food and vomit: Secondary | ICD-10-CM | POA: Insufficient documentation

## 2018-12-06 DIAGNOSIS — N184 Chronic kidney disease, stage 4 (severe): Secondary | ICD-10-CM | POA: Insufficient documentation

## 2018-12-06 DIAGNOSIS — I129 Hypertensive chronic kidney disease with stage 1 through stage 4 chronic kidney disease, or unspecified chronic kidney disease: Secondary | ICD-10-CM | POA: Insufficient documentation

## 2018-12-06 DIAGNOSIS — Z515 Encounter for palliative care: Secondary | ICD-10-CM | POA: Diagnosis not present

## 2018-12-06 MED ORDER — MORPHINE SULFATE (CONCENTRATE) 20 MG/ML PO SOLN: 5.0000 mg | ORAL | 0 refills | Status: AC | PRN

## 2018-12-06 NOTE — Progress Notes (Signed)
Location:  [facility] Room Number: [place] of Service:  SNF [phone]) Provider: Veleta Miners, MD  Virgie Dad, MD  Patient Care Team: Virgie Dad, MD as PCP - General (Internal Medicine) Virgie Dad, MD as Consulting Physician (Geriatric Medicine) Rolm Baptise as Physician Assistant (Internal Medicine)  Extended Emergency Contact Information Primary Emergency Contact: Tameshia, [place], [place] [address] [address] of [place] Phone: [phone] Mobile Phone: [phone] Relation: Daughter Secondary Emergency Contact: Denys, [place],  [address] [place] of [place] Phone: [phone] Relation: Son  Code Status:  DNR (copy requested from [facility] Na) Goals of care: Advanced Directive information Advanced Directives 12/06/2018  Does Patient Have a Medical Advance Directive? Yes  Type of Advance Directive Out of facility DNR (pink MOST or yellow form)  Does patient want to make changes to medical advance directive? No - Patient declined  Copy of [place] in Chart? -  Pre-existing out of facility DNR order (yellow form or pink MOST form) -     Chief Complaint  Patient presents with  . Acute Visit    End of Life    HPI:  Pt is a 85 y.o. female seen today for an acute visit for End of life Patient with end stage Dementia was made Comfort Care per her family after she had Aspiration with possible pneumonia  Patienthas h/o Dementia with Behavior problems,Diastolic CHF,Hypertension, CKD Stage 4, Anemia And h/O Femur fracture Patient has a history of advanced dementia.  She recently had an episode of aspiration when the nurses noticed some fluid in patient's throat on the weekend.  At that time her chest x-ray was negative and patient responded to mild suctioning. But on Fri  after lunch patient started to have severe cough and rales.  Her oxygen dropped to 60%.  The nurses started  suctioning.  And got a lot of mucus.  No food.  Patient seemed comfortable had no fever.  But she stayed hypoxic and had to be put on 4 to 5 L of oxygen to keep her pulse ox more than 90%.  She became more lethargic and was not responding.Per her daughter request  She was kept in facility and made Comfort care She was started on Antibiotics and Prednisone Over Weekend patient has become more lethargic and is not eating. She is also Spiking temp of 100.6 Cough and  SOB Family in room want her to be comfortable   Past Medical History:  Diagnosis Date  . Alzheimer disease ([place])   . Anemia   . Anxiety   . Arthritis   . Dementia ([place]) 11/17/2013   She is on alprazolam as well as generic Rispedal twice a day   . Hyperlipemia   . Hypertension   . Kidney calculus   . Pneumonia   . UTI (urinary tract infection) With MRSA 12/11/2016   Past Surgical History:  Procedure Laterality Date  . CHOLECYSTECTOMY    . HIP ARTHROPLASTY Left 11/18/2013   Procedure: ARTHROPLASTY BIPOLAR HIP;  Surgeon: Sanjuana Kava, MD;  Location: [facility];  Service: Orthopedics;  Laterality: Left;    Allergies  Allergen Reactions  . Sulfa Antibiotics Rash    Outpatient Encounter Medications as of 12/06/2018  Medication Sig  . acetaminophen (TYLENOL) 650 MG suppository Place 650 mg rectally every 6 (six) hours as needed.  Marland Kitchen amoxicillin-clavulanate (AUGMENTIN)  875-125 MG tablet Take 1 tablet by mouth 2 (two) times daily.  Marland Kitchen aspirin 81 MG chewable tablet Chew 81 mg by mouth daily.  . calcium-vitamin D (OSCAL WITH D) 500-200 MG-UNIT tablet Take 1 tablet by mouth 2 (two) times daily.  Marland Kitchen dextromethorphan 15 MG/5ML syrup Give 15 ml by mouth every 6 hours prn from 09/12/2018-09/14/2018.then give 15 ml by mouth every 6 hours prn starting 09/15/2018.  . furosemide (LASIX) 20 MG tablet Take 20 mg by mouth daily.  Marland Kitchen ketoconazole (NIZORAL) 2 % shampoo Apply 1 application topically daily as needed. As needed On Tues., Thurs.  Marland Kitchen morphine  (ROXANOL) 20 MG/ML concentrated solution Take 5 mg by mouth every 4 (four) hours as needed for severe pain.  . NON FORMULARY Diet Type:  NAS - Honey thicken liquids  . potassium chloride (KLOR-CON) 20 MEQ packet Take 20 mEq by mouth daily.  . predniSONE (DELTASONE) 20 MG tablet Take 2 tablets (40 mg) by mouth daily  . risperiDONE (RISPERDAL) 0.25 MG tablet Take 0.25 mg by mouth daily.  . risperiDONE (RISPERDAL) 1 MG/ML oral solution Take 0.5 mg by mouth at bedtime.   . [DISCONTINUED] Morphine Sulfate (MORPHINE CONCENTRATE) 10 mg / 0.5 ml concentrated solution Take 0.25 mLs (5 mg total) by mouth every 4 (four) hours as needed for severe pain. (Patient not taking: Reported on 12/06/2018)   No facility-administered encounter medications on file as of 12/06/2018.     Review of Systems  Unable to perform ROS: Patient unresponsive    Immunization History  Administered Date(s) Administered  . Influenza-Unspecified 06/08/2014, 07/02/2015, 06/03/2016, 06/05/2017, 06/03/2018  . Pneumococcal Conjugate-13 10/30/2015  . Pneumococcal-Unspecified 06/10/2016  . Tdap 05/22/2017   Pertinent  Health Maintenance Due  Topic Date Due  . INFLUENZA VACCINE  04/02/2019  . DEXA SCAN  Completed  . PNA vac Low Risk Adult  Completed   Fall Risk  06/01/2018 05/21/2017  Falls in the past year? No Yes  Number falls in past yr: - 1  Injury with Fall? - Yes   Functional Status Survey:    Vitals:   12/06/18 1033  BP: 96/82  Pulse: 88  Resp: (!) 28  Temp: (!) 101 F (38.3 C)  Weight: 190 lb 3.2 oz (86.3 kg)  Height: 5\' 1"  (1.549 m)   Body mass index is 35.94 kg/m. Physical Exam Vitals signs reviewed.  HENT:     Head: Normocephalic.     Nose: Nose normal.     Mouth/Throat:     Mouth: Mucous membranes are moist.  Eyes:     Pupils: Pupils are equal, round, and reactive to light.  Neck:     Musculoskeletal: Neck supple.  Cardiovascular:     Rate and Rhythm: Normal rate and regular rhythm.     Pulses:  Normal pulses.  Pulmonary:     Effort: Pulmonary effort is normal.     Comments: Rales Bilateral Abdominal:     General: Abdomen is flat.     Palpations: Abdomen is soft.  Musculoskeletal:        General: Swelling present.  Skin:    General: Skin is warm and dry.  Neurological:     Mental Status: She is lethargic.     Comments: Patient not responding  Psychiatric:        Mood and Affect: Mood normal.        Thought Content: Thought content normal.     Labs reviewed: Recent Labs    09/27/18 0100 10/04/18 0036  11/09/18 0700  NA 142 142 142  K 3.6 4.2 3.5  CL 111 110 111  CO2 24 23 23   GLUCOSE 94 95 89  BUN 38* 31* 39*  CREATININE 1.85* 1.88* 1.80*  CALCIUM 8.5* 8.9 8.6*   Recent Labs    07/02/18 0715 11/09/18 0700  AST 11* 17  ALT 10 19  ALKPHOS 83 86  BILITOT 0.5 0.2*  PROT 6.4* 7.0  ALBUMIN 2.9* 3.2*   Recent Labs    01/29/18 0747 07/02/18 0715 11/09/18 0700  WBC 7.6 8.7 7.7  NEUTROABS 4.4 4.8  --   HGB 11.1* 10.7* 11.3*  HCT 35.5* 35.7* 37.8  MCV 86.8 89.3 86.9  PLT 228 235 274   Lab Results  Component Value Date   TSH 1.708 12/08/2016   Lab Results  Component Value Date   HGBA1C 5.4 11/09/2018   Lab Results  Component Value Date   CHOL 166 12/27/2015   HDL 44 12/27/2015   LDLCALC 114 (H) 12/27/2015   TRIG 39 12/27/2015   CHOLHDL 3.8 12/27/2015    Significant Diagnostic Results in last 30 days:  No results found.  Assessment/Plan Patient With Advances dementia now unresponsive due to Aspiration Pneumonia Will discontinue all PO meds Roxanol Q2 hours for Anxiety and discomfort Ativan Q4 hours PRN Tylenol Supo Q4 Prn for temp Will Continue to make sure patient is comfortable D/W the daughter and son in her room   Family/ staff Communication:   Labs/tests ordered:   Total time spent in this patient care encounter was _25 minutes; greater than 50% of the visit spent counseling patient family, reviewing records , Labs and  coordinating care for problems addressed at this encounter.

## 2018-12-07 ENCOUNTER — Non-Acute Institutional Stay (SKILLED_NURSING_FACILITY): Payer: Medicare Other | Admitting: Internal Medicine

## 2018-12-07 DIAGNOSIS — Z515 Encounter for palliative care: Secondary | ICD-10-CM | POA: Diagnosis not present

## 2018-12-07 NOTE — Progress Notes (Signed)
This is an acute visit.  Level care skilled.  Facility is [facility].  Chief complaint acute visit secondary to end-of-life care.  History of present illness.  Patient is a 85 year old female who is a long-term resident of facility.  She has a history of end-stage dementia as well as chronic kidney disease hypertension CHF anemia and a history of femur fracture in the past.  She has been made comfort cares secondary to her advanced dementia as well as recent decline in status.  She recently had an aspiration episode apparently there was some fluid noted in patient's throat.  Her chest x-ray was negative - she did respond to mild suctioning  But apparently last Friday after lunch she started to have a severe cough and rales.  Her oxygen saturation dropped to 60% nursing started suctioning and got a lot of mucus but no food.  And patient seemed to be comfortable- she had no fever but she stayed hypoxic and had to be put up to 5 L of oxygen to keep her pulse ox more than 90%.  However she became more lethargic and was not responding and per her daughter's request she was kept in the facility and made comfort care.  She was started on antibiotics and prednisone.  However she became more lethargic and was not eating over the weekend and actually developed a temperature of 100.6 with cough and shortness of breath.  Family reiterated to Dr. Lyndel Safe they wanted her just to be comfortable  P.o. meds have been discontinued and Roxanol every 2 hours was started as well as Ativan every 4 hours as needed.  She also has a Tylenol suppository for elevated temperature.  Today she continues to decline she appears to be at the very end stages she is unresponsive with irregular breathing pattern.  She does appear to be comfortable I do not see any sign of distress or labored breathing--her family has arrived at the facility and at her bedside-.  And they again are comfortable with keeping  her here and realized that her time is quite short most likely.  Past Medical History:  Diagnosis Date  . Alzheimer disease ([place])   . Anemia   . Anxiety   . Arthritis   . Dementia ([place]) 11/17/2013   She is on alprazolam as well as generic Rispedal twice a day   . Hyperlipemia   . Hypertension   . Kidney calculus   . Pneumonia   . UTI (urinary tract infection) With MRSA 12/11/2016        Past Surgical History:  Procedure Laterality Date  . CHOLECYSTECTOMY    . HIP ARTHROPLASTY Left 11/18/2013   Procedure: ARTHROPLASTY BIPOLAR HIP;  Surgeon: Sanjuana Kava, MD;  Location: [facility];  Service: Orthopedics;  Laterality: Left;        Allergies  Allergen Reactions  . Sulfa Antibiotics Rash      MEDICATIONS  Have been reviewed again she is essentially on Roxanol every 2 hours as needed for anxiety or discomfort as well as Ativan every 4 hours as needed and a Tylenol suppository for elevated temperature     Medication Sig  .     .    .    .    .    .    .    .     .    .    .    .    .    .    Medications  reviewed.  She is on Roxanol 5 mg every 2 hours as needed for pain.  Tylenol suppository 650 mg every 4 hours As needed       Immunization History  Administered Date(s) Administered  . Influenza-Unspecified 06/08/2014, 07/02/2015, 06/03/2016, 06/05/2017, 06/03/2018  . Pneumococcal Conjugate-13 10/30/2015  . Pneumococcal-Unspecified 06/10/2016  . Tdap 05/22/2017       Pertinent  Health Maintenance Due  Topic Date Due  . INFLUENZA VACCINE  04/02/2019  . DEXA SCAN  Completed  . PNA vac Low Risk Adult  Completed   Fall Risk  06/01/2018 05/21/2017  Falls in the past year? No Yes  Number falls in past yr: - 1  Injury with Fall? - Yes   Functional Status Survey:  --Review of systems.  Unobtainable please see HPI.  Physical exam.  She is afebrile pulse is 90 respirations of 27 blood pressure 90/58's oxygen saturation is 96% on  oxygen.  General this is a well-nourished elderly female who is unresponsive.  She is doing some mouth breathing her breathing is not labored she does not appear to be uncomfortable-.  Her skin is warm and dry.  Mouth mucous membranes are dry.  Chest she has some rails but there is no labored breathing  Heart is regular rate and rhythm with some occasional irregular beats.  Abdomen is soft does not appear to be tender it is obese.  Musculoskeletal she is not moving her extremities again she is unresponsive.  Neurologic as noted above.  Assessment and plan.  End-of-life care-at this point patient appears to be comfortable I did discuss this with family in the room and I did evaluate her x2 during the day.  She does have Roxanol and Ativan as needed.  At this point continue to monitor encourage comfort measures- but she appears to be at peace at this point  CPT- [phone]

## 2018-12-31 DEATH — deceased
